# Patient Record
Sex: Female | Born: 1935
Health system: Southern US, Community
[De-identification: ages and names within clinical notes are randomized; demographics above are authoritative.]

## PROBLEM LIST (undated history)

## (undated) DIAGNOSIS — D649 Anemia, unspecified: Secondary | ICD-10-CM

## (undated) DIAGNOSIS — R935 Abnormal findings on diagnostic imaging of other abdominal regions, including retroperitoneum: Secondary | ICD-10-CM

## (undated) DIAGNOSIS — N189 Chronic kidney disease, unspecified: Secondary | ICD-10-CM

## (undated) DIAGNOSIS — E0590 Thyrotoxicosis, unspecified without thyrotoxic crisis or storm: Secondary | ICD-10-CM

## (undated) DIAGNOSIS — H409 Unspecified glaucoma: Secondary | ICD-10-CM

## (undated) DIAGNOSIS — N289 Disorder of kidney and ureter, unspecified: Secondary | ICD-10-CM

## (undated) DIAGNOSIS — K5790 Diverticulosis of intestine, part unspecified, without perforation or abscess without bleeding: Secondary | ICD-10-CM

## (undated) DIAGNOSIS — I10 Essential (primary) hypertension: Secondary | ICD-10-CM

## (undated) HISTORY — PX: COLONOSCOPY: SHX174

## (undated) HISTORY — DX: Unspecified glaucoma: H40.9

## (undated) HISTORY — DX: Abnormal findings on diagnostic imaging of other abdominal regions, including retroperitoneum: R93.5

## (undated) HISTORY — DX: Essential (primary) hypertension: I10

## (undated) HISTORY — DX: Thyrotoxicosis, unspecified without thyrotoxic crisis or storm: E05.90

## (undated) HISTORY — DX: Chronic kidney disease, unspecified: N18.9

## (undated) HISTORY — DX: Anemia, unspecified: D64.9

## (undated) HISTORY — DX: Diverticulosis of intestine, part unspecified, without perforation or abscess without bleeding: K57.90

## (undated) HISTORY — DX: Disorder of kidney and ureter, unspecified: N28.9

---

## 2001-07-19 ENCOUNTER — Emergency Department (HOSPITAL_COMMUNITY): Admission: EM | Admit: 2001-07-19 | Discharge: 2001-07-19 | Payer: Self-pay | Admitting: Emergency Medicine

## 2003-04-02 ENCOUNTER — Emergency Department (HOSPITAL_COMMUNITY): Admission: EM | Admit: 2003-04-02 | Discharge: 2003-04-02 | Payer: Self-pay | Admitting: Emergency Medicine

## 2003-04-02 ENCOUNTER — Encounter: Payer: Self-pay | Admitting: Emergency Medicine

## 2003-12-29 ENCOUNTER — Encounter: Admission: RE | Admit: 2003-12-29 | Discharge: 2003-12-29 | Payer: Self-pay | Admitting: Family Medicine

## 2004-11-24 HISTORY — PX: CATARACT EXTRACTION W/ INTRAOCULAR LENS  IMPLANT, BILATERAL: SHX1307

## 2005-12-25 ENCOUNTER — Emergency Department (HOSPITAL_COMMUNITY): Admission: EM | Admit: 2005-12-25 | Discharge: 2005-12-25 | Payer: Self-pay | Admitting: Emergency Medicine

## 2005-12-29 ENCOUNTER — Encounter: Admission: RE | Admit: 2005-12-29 | Discharge: 2005-12-29 | Payer: Self-pay | Admitting: Family Medicine

## 2006-07-28 ENCOUNTER — Encounter: Payer: Self-pay | Admitting: Internal Medicine

## 2007-05-31 ENCOUNTER — Ambulatory Visit: Payer: Self-pay | Admitting: Internal Medicine

## 2007-05-31 DIAGNOSIS — I10 Essential (primary) hypertension: Secondary | ICD-10-CM | POA: Insufficient documentation

## 2007-05-31 LAB — CONVERTED CEMR LAB
Hemoglobin: 9 g/dL
Ketones, urine, test strip: NEGATIVE
Nitrite: NEGATIVE
Urobilinogen, UA: NEGATIVE

## 2007-06-02 ENCOUNTER — Encounter: Payer: Self-pay | Admitting: Internal Medicine

## 2007-06-04 ENCOUNTER — Encounter: Payer: Self-pay | Admitting: Internal Medicine

## 2007-06-04 LAB — CONVERTED CEMR LAB
Albumin: 3.2 g/dL — ABNORMAL LOW (ref 3.5–5.2)
Basophils Absolute: 0 10*3/uL (ref 0.0–0.1)
Chloride: 113 meq/L — ABNORMAL HIGH (ref 96–112)
Eosinophils Absolute: 0 10*3/uL (ref 0.0–0.6)
Folate: 8.8 ng/mL
GFR calc Af Amer: 36 mL/min
GFR calc non Af Amer: 30 mL/min
HCT: 29.2 % — ABNORMAL LOW (ref 36.0–46.0)
Iron: 40 ug/dL — ABNORMAL LOW (ref 42–145)
MCHC: 34 g/dL (ref 30.0–36.0)
MCV: 83.2 fL (ref 78.0–100.0)
Monocytes Absolute: 0.3 10*3/uL (ref 0.2–0.7)
Neutrophils Relative %: 76.8 % (ref 43.0–77.0)
Potassium: 5 meq/L (ref 3.5–5.1)
RBC: 3.51 M/uL — ABNORMAL LOW (ref 3.87–5.11)
Sodium: 140 meq/L (ref 135–145)
TSH: 0.01 microintl units/mL — ABNORMAL LOW (ref 0.35–5.50)
Transferrin: 188.5 mg/dL — ABNORMAL LOW (ref >=212.0)
Vitamin B-12: 414 pg/mL (ref 211–911)

## 2007-06-05 ENCOUNTER — Encounter: Payer: Self-pay | Admitting: Internal Medicine

## 2007-06-07 ENCOUNTER — Ambulatory Visit: Payer: Self-pay | Admitting: Internal Medicine

## 2007-06-21 ENCOUNTER — Encounter: Payer: Self-pay | Admitting: Internal Medicine

## 2007-06-21 ENCOUNTER — Ambulatory Visit: Payer: Self-pay | Admitting: Internal Medicine

## 2007-06-23 ENCOUNTER — Ambulatory Visit: Payer: Self-pay | Admitting: Internal Medicine

## 2007-06-23 LAB — CONVERTED CEMR LAB
BUN: 21 mg/dL (ref 6–23)
Creatinine, Ser: 1.3 mg/dL — ABNORMAL HIGH (ref 0.4–1.2)
Eosinophils Absolute: 0.1 10*3/uL (ref 0.0–0.6)
Lymphocytes Relative: 28.3 % (ref 12.0–46.0)
MCV: 84.2 fL (ref 78.0–100.0)
Monocytes Absolute: 0.6 10*3/uL (ref 0.2–0.7)
Monocytes Relative: 7.9 % (ref 3.0–11.0)
Neutro Abs: 4.4 10*3/uL (ref 1.4–7.7)
Platelets: 393 10*3/uL (ref 150–400)

## 2007-06-24 ENCOUNTER — Ambulatory Visit: Payer: Self-pay | Admitting: Cardiology

## 2007-07-02 ENCOUNTER — Telehealth (INDEPENDENT_AMBULATORY_CARE_PROVIDER_SITE_OTHER): Payer: Self-pay | Admitting: *Deleted

## 2007-07-15 ENCOUNTER — Ambulatory Visit: Payer: Self-pay | Admitting: Internal Medicine

## 2007-07-15 LAB — CONVERTED CEMR LAB
Basophils Absolute: 0 10*3/uL (ref 0.0–0.1)
Basophils Relative: 0.5 % (ref 0.0–1.0)
Lymphocytes Relative: 32.2 % (ref 12.0–46.0)
MCHC: 34.9 g/dL (ref 30.0–36.0)
Monocytes Relative: 8 % (ref 3.0–11.0)
Neutro Abs: 4.6 10*3/uL (ref 1.4–7.7)
Platelets: 345 10*3/uL (ref 150–400)
RDW: 15.6 % — ABNORMAL HIGH (ref 11.5–14.6)
Sed Rate: 77 mm/hr — ABNORMAL HIGH (ref 0–25)

## 2007-08-03 ENCOUNTER — Ambulatory Visit: Payer: Self-pay | Admitting: Internal Medicine

## 2007-08-03 DIAGNOSIS — R935 Abnormal findings on diagnostic imaging of other abdominal regions, including retroperitoneum: Secondary | ICD-10-CM | POA: Insufficient documentation

## 2007-08-03 DIAGNOSIS — R700 Elevated erythrocyte sedimentation rate: Secondary | ICD-10-CM

## 2007-08-03 DIAGNOSIS — D649 Anemia, unspecified: Secondary | ICD-10-CM | POA: Insufficient documentation

## 2007-08-09 LAB — CONVERTED CEMR LAB
Basophils Relative: 0.2 % (ref 0.0–1.0)
Eosinophils Relative: 2 % (ref 0.0–5.0)
Iron: 46 ug/dL (ref 42–145)
Lymphocytes Relative: 29.5 % (ref 12.0–46.0)
Neutro Abs: 4.5 10*3/uL (ref 1.4–7.7)
Platelets: 385 10*3/uL (ref 150–400)
RDW: 14.4 % (ref 11.5–14.6)
TSH: 0.02 microintl units/mL — ABNORMAL LOW (ref 0.35–5.50)
Transferrin: 196.5 mg/dL — ABNORMAL LOW (ref >=212.0)
WBC: 7.5 10*3/uL (ref 4.5–10.5)

## 2007-09-15 ENCOUNTER — Ambulatory Visit: Payer: Self-pay | Admitting: Internal Medicine

## 2007-09-22 ENCOUNTER — Encounter: Payer: Self-pay | Admitting: Gynecologic Oncology

## 2007-09-22 ENCOUNTER — Ambulatory Visit: Admission: RE | Admit: 2007-09-22 | Discharge: 2007-09-22 | Payer: Self-pay | Admitting: Gynecologic Oncology

## 2007-10-22 ENCOUNTER — Encounter (INDEPENDENT_AMBULATORY_CARE_PROVIDER_SITE_OTHER): Payer: Self-pay | Admitting: *Deleted

## 2007-11-22 ENCOUNTER — Ambulatory Visit: Payer: Self-pay | Admitting: Internal Medicine

## 2007-11-29 LAB — CONVERTED CEMR LAB
Basophils Relative: 0.7 % (ref 0.0–1.0)
CO2: 26 meq/L (ref 19–32)
Chloride: 110 meq/L (ref 96–112)
Creatinine, Ser: 1.1 mg/dL (ref 0.4–1.2)
Eosinophils Relative: 2.3 % (ref 0.0–5.0)
HCT: 29.8 % — ABNORMAL LOW (ref 36.0–46.0)
Hemoglobin: 10.1 g/dL — ABNORMAL LOW (ref 12.0–15.0)
Iron: 56 ug/dL (ref 42–145)
Monocytes Absolute: 0.3 10*3/uL (ref 0.2–0.7)
Neutrophils Relative %: 60 % (ref 43.0–77.0)
Potassium: 4.5 meq/L (ref 3.5–5.1)
RBC: 3.5 M/uL — ABNORMAL LOW (ref 3.87–5.11)
RDW: 14 % (ref 11.5–14.6)
Sodium: 139 meq/L (ref 135–145)
WBC: 7.6 10*3/uL (ref 4.5–10.5)

## 2007-11-30 ENCOUNTER — Encounter: Admission: RE | Admit: 2007-11-30 | Discharge: 2007-11-30 | Payer: Self-pay | Admitting: Internal Medicine

## 2007-12-20 ENCOUNTER — Telehealth (INDEPENDENT_AMBULATORY_CARE_PROVIDER_SITE_OTHER): Payer: Self-pay | Admitting: *Deleted

## 2008-01-27 DIAGNOSIS — K5730 Diverticulosis of large intestine without perforation or abscess without bleeding: Secondary | ICD-10-CM | POA: Insufficient documentation

## 2008-02-16 ENCOUNTER — Ambulatory Visit: Payer: Self-pay | Admitting: Internal Medicine

## 2008-02-22 ENCOUNTER — Telehealth (INDEPENDENT_AMBULATORY_CARE_PROVIDER_SITE_OTHER): Payer: Self-pay | Admitting: *Deleted

## 2008-02-22 LAB — CONVERTED CEMR LAB
T3, Free: 5.7 pg/mL — ABNORMAL HIGH (ref 2.3–4.2)
TSH: 0.02 microintl units/mL — ABNORMAL LOW (ref 0.35–5.50)

## 2008-02-23 ENCOUNTER — Encounter (INDEPENDENT_AMBULATORY_CARE_PROVIDER_SITE_OTHER): Payer: Self-pay | Admitting: *Deleted

## 2008-03-28 ENCOUNTER — Telehealth (INDEPENDENT_AMBULATORY_CARE_PROVIDER_SITE_OTHER): Payer: Self-pay | Admitting: *Deleted

## 2008-04-06 ENCOUNTER — Encounter (INDEPENDENT_AMBULATORY_CARE_PROVIDER_SITE_OTHER): Payer: Self-pay | Admitting: *Deleted

## 2008-05-01 ENCOUNTER — Ambulatory Visit: Payer: Self-pay | Admitting: Internal Medicine

## 2008-05-04 ENCOUNTER — Telehealth (INDEPENDENT_AMBULATORY_CARE_PROVIDER_SITE_OTHER): Payer: Self-pay | Admitting: *Deleted

## 2008-05-04 LAB — CONVERTED CEMR LAB: TSH: 0.03 microintl units/mL — ABNORMAL LOW (ref 0.35–5.50)

## 2008-05-08 ENCOUNTER — Encounter (INDEPENDENT_AMBULATORY_CARE_PROVIDER_SITE_OTHER): Payer: Self-pay | Admitting: *Deleted

## 2008-05-23 ENCOUNTER — Telehealth (INDEPENDENT_AMBULATORY_CARE_PROVIDER_SITE_OTHER): Payer: Self-pay | Admitting: *Deleted

## 2008-06-13 ENCOUNTER — Ambulatory Visit: Payer: Self-pay | Admitting: Internal Medicine

## 2008-06-13 DIAGNOSIS — E039 Hypothyroidism, unspecified: Secondary | ICD-10-CM | POA: Insufficient documentation

## 2008-06-28 ENCOUNTER — Encounter: Payer: Self-pay | Admitting: Internal Medicine

## 2009-01-29 ENCOUNTER — Encounter: Payer: Self-pay | Admitting: Internal Medicine

## 2010-02-21 ENCOUNTER — Ambulatory Visit: Payer: Self-pay | Admitting: Internal Medicine

## 2010-02-21 LAB — CONVERTED CEMR LAB
BUN: 21 mg/dL (ref 6–23)
Basophils Absolute: 0.1 10*3/uL (ref 0.0–0.1)
Basophils Relative: 1.1 % (ref 0.0–3.0)
Chloride: 109 meq/L (ref 96–112)
Cholesterol: 108 mg/dL (ref 0–200)
Creatinine, Ser: 1.7 mg/dL — ABNORMAL HIGH (ref 0.4–1.2)
Eosinophils Absolute: 0.1 10*3/uL (ref 0.0–0.7)
GFR calc non Af Amer: 37.9 mL/min (ref >60)
Hemoglobin: 11.6 g/dL — ABNORMAL LOW (ref 12.0–15.0)
LDL Cholesterol: 49 mg/dL (ref 0–99)
MCHC: 33.3 g/dL (ref 30.0–36.0)
MCV: 90 fL (ref 78.0–100.0)
Monocytes Absolute: 0.5 10*3/uL (ref 0.1–1.0)
Neutro Abs: 4 10*3/uL (ref 1.4–7.7)
Potassium: 4.4 meq/L (ref 3.5–5.1)
RBC: 3.89 M/uL (ref 3.87–5.11)
RDW: 15.9 % — ABNORMAL HIGH (ref 11.5–14.6)
Triglycerides: 76 mg/dL (ref 0.0–149.0)
VLDL: 15.2 mg/dL (ref 0.0–40.0)

## 2010-02-27 ENCOUNTER — Telehealth (INDEPENDENT_AMBULATORY_CARE_PROVIDER_SITE_OTHER): Payer: Self-pay | Admitting: *Deleted

## 2010-02-27 DIAGNOSIS — N182 Chronic kidney disease, stage 2 (mild): Secondary | ICD-10-CM | POA: Insufficient documentation

## 2010-03-26 ENCOUNTER — Ambulatory Visit: Payer: Self-pay | Admitting: Internal Medicine

## 2010-04-09 ENCOUNTER — Ambulatory Visit: Payer: Self-pay | Admitting: Internal Medicine

## 2010-04-11 ENCOUNTER — Telehealth: Payer: Self-pay | Admitting: Internal Medicine

## 2010-04-11 LAB — CONVERTED CEMR LAB
Chloride: 104 meq/L (ref 96–112)
GFR calc non Af Amer: 40.63 mL/min (ref >60)

## 2010-04-18 ENCOUNTER — Ambulatory Visit (HOSPITAL_COMMUNITY): Admission: RE | Admit: 2010-04-18 | Discharge: 2010-04-18 | Payer: Self-pay | Admitting: Internal Medicine

## 2010-05-08 ENCOUNTER — Encounter: Payer: Self-pay | Admitting: Internal Medicine

## 2010-09-09 ENCOUNTER — Encounter: Payer: Self-pay | Admitting: Internal Medicine

## 2010-09-23 ENCOUNTER — Ambulatory Visit: Payer: Self-pay | Admitting: Internal Medicine

## 2010-09-23 DIAGNOSIS — N183 Chronic kidney disease, stage 3 (moderate): Secondary | ICD-10-CM

## 2010-09-25 LAB — CONVERTED CEMR LAB
ALT: 13 units/L (ref 0–35)
AST: 23 units/L (ref 0–37)
CO2: 24 meq/L (ref 19–32)
Chloride: 109 meq/L (ref 96–112)
Sodium: 139 meq/L (ref 135–145)
Uric Acid, Serum: 9.1 mg/dL — ABNORMAL HIGH (ref 2.4–7.0)

## 2010-12-15 ENCOUNTER — Encounter: Payer: Self-pay | Admitting: Internal Medicine

## 2010-12-26 NOTE — Assessment & Plan Note (Signed)
Summary: yearly checkup/kdc   Vital Signs:  Patient profile:   76 year old female Height:      64.25 inches Weight:      162.2 pounds BMI:     27.73 Pulse rate:   74 / minute BP sitting:   120 / 80  Vitals Entered By: Shary Decamp (Mar 26, 2010 2:55 PM) CC: yearly, has gyn, last Td??  pt declined Td today   History of Present Illness: yearly checkup, chart reviewed  Hypertension--  ambulatory BPs fluctuates up to 153 sometimes   elevated creatinine , was refered to nephrology, appointment pending   Hypothyroidism-- see endocrinology    h/o  INSOMNIA-- sleeps well, on no meds        Preventive Screening-Counseling & Management  Caffeine-Diet-Exercise     Caffeine use/day: 3     Does Patient Exercise: no  Current Medications (verified): 1)  Diovan 160 Mg  Tabs (Valsartan) .Marland Kitchen.. 1 By Mouth Qd 2)  Amlodipine Besylate 10 Mg  Tabs (Amlodipine Besylate) .Marland Kitchen.. 1 By Mouth Once Daily 3)  Levothroid 75 Mcg Tabs (Levothyroxine Sodium) .Marland Kitchen.. 1 By Mouth Once Daily  Allergies (verified): No Known Drug Allergies  Past History:  Past Medical History: G1 P1 Hypertension Heperthyroidism, s/p ablation, sees Dr Talmage Nap  DERMATITIS  DIVERTICULOSIS, COLON  h/o elevated sed rate ABNORMAL  CT and MRI of pancreas, last MRI 1-09: after d/w GI we rec.  to f/u clinically and redo XRs if problems h/o ANEMIA  CRI? elevated creatinine 4-11 , refered to nephrology  Past Surgical History: h/o thick endometrion, s/p Bx 2008   Family History: Father: deceased TB MI-- sister (age 69?)  and a brother (age 29 aprox.) DM--M Colon Ca -- sister breast ca-- no    Social History: Divorced lives with her sister  Has one child who lives here in town tobacco-- never ETOH-- never  diet-- not paying attention  exercise-- noDoes Patient Exercise:  no Caffeine use/day:  3  Review of Systems CV:  Denies chest pain or discomfort, palpitations, and swelling of feet. Resp:  Denies cough and  shortness of breath. GI:  Denies bloody stools, nausea, and vomiting. GU:  Denies dysuria, hematuria, and urinary hesitancy. Psych:  Denies anxiety and depression.  Physical Exam  General:  alert and well-developed.   Lungs:  normal respiratory effort, no intercostal retractions, no accessory muscle use, and normal breath sounds.   Heart:  normal rate, regular rhythm, and no murmur.   Abdomen:  soft, non-tender, no distention, no masses, no guarding, and no rigidity.   Extremities:  no edema Psych:  Oriented X3, memory intact for recent and remote, normally interactive, good eye contact, not anxious appearing, and not depressed appearing.     Impression & Recommendations:  Problem # 1:  CHRONIC KIDNEY DISEASE STAGE II (MILD) (ICD-585.2)  referral to nephrology  pending on reviewing the labs, her creatinine has been increased before  plan: will be sure she is seen by nephrology ultrasound decrease Diovan to 80 mg, instructions  Orders: Radiology Referral (Radiology)  Problem # 2:  ROUTINE GENERAL MEDICAL EXAM@HEALTH  CARE FACL (ICD-V70.0)  pt somehow reluctant to anticipatory care, explained concept of screening  Td--today pneumonia shot-- declined, explained benefits  cscope  2008, no polyps, repeat in 5 years due to family history   has seen Dr Henderson Cloud before, not seen  in a while  rec to see gyn  mammogram  family history coronary artery disease, recommend to take aspirin 81  mg daily  Orders: Radiology Referral (Radiology)  Problem # 3:  HYPERTHYROIDISM (ICD-242.90)  follow-up by endocrinology  Labs Reviewed: TSH: 0.03 (05/01/2008)     Problem # 4:  HYPERTENSION (ICD-401.9)  because her creatinine is elevated, we are cutting Diovan in  half, see instructions---no charge for nurse visit EKG normal sinus rhythm, no old EKGs  Her updated medication list for this problem includes:    Diovan 160 Mg Tabs (Valsartan) ..... Half tablet daily    Amlodipine  Besylate 10 Mg Tabs (Amlodipine besylate) .Marland Kitchen... 1 by mouth once daily  BP today: 120/80 Prior BP: 144/80 (02/21/2010)  Labs Reviewed: K+: 4.4 (02/21/2010) Creat: : 1.7 (02/21/2010)   Chol: 108 (02/21/2010)   HDL: 44.00 (02/21/2010)   LDL: 49 (02/21/2010)   TG: 76.0 (02/21/2010)  Orders: EKG w/ Interpretation (93000)  Problem # 5:  ANEMIA NOS (ICD-285.9) she has mild anemia, labs  Hgb: 11.6 (02/21/2010)   Hct: 35.0 (02/21/2010)   Platelets: 364.0 (02/21/2010) RBC: 3.89 (02/21/2010)   RDW: 15.9 (02/21/2010)   WBC: 6.4 (02/21/2010) MCV: 90.0 (02/21/2010)   MCHC: 33.3 (02/21/2010) Ferritin: 157.8 (06/07/2007) Iron: 56 (11/22/2007)   B12: 414 (05/31/2007)   Folate: 8.8 (05/31/2007)   TSH: 0.03 (05/01/2008)  Complete Medication List: 1)  Diovan 160 Mg Tabs (Valsartan) .... Half tablet daily 2)  Amlodipine Besylate 10 Mg Tabs (Amlodipine besylate) .Marland Kitchen.. 1 by mouth once daily 3)  Levothroid 75 Mcg Tabs (Levothyroxine sodium) .Marland Kitchen.. 1 by mouth once daily 4)  Aspirin 81 Mg Tbec (Aspirin) .... One tablet by mouth daily  Other Orders: Tdap => 28yrs IM (16109) Admin 1st Vaccine (60454) Admin 1st Vaccine [facility]) [phone])  Patient Instructions: 1)  take only half diovan 2)  start  aspirin 81 mg daily 3)  nurse visit in two weeks for a BP check  4)  BMP  (dx HTN) , iron-ferritin-B12-folic acid (Dx anemia) 5)  Please schedule a follow-up appointment in 6 months .    Preventive Care Screening  Prior Values:    Last Flu Shot:  declined (11/22/2007)    Risk Factors:  Tobacco use:  never Passive smoke exposure:  yes Drug use:  no Caffeine use:  3 drinks per day Alcohol use:  no Exercise:  no    Tetanus/Td Vaccine    Vaccine Type: Tdap    Site: right deltoid    Dose: 0.5 ml    Route: IM    Given by: Shary Decamp    Exp. Date: 02/16/2012    Lot #: [id number]

## 2010-12-26 NOTE — Progress Notes (Signed)
Summary: [facility] 4/7, 4/8  Phone Note Other Incoming   Summary of Call: labs reviewed her creatinine has increased, she has a single kidney ( left kidney absent per CTs and MRIs) advise patient: Cholesterol very good hemoglobin better than before her kidney function has decreased, will arrange a nephrology referral.  Send copy of all BMPs available, send a copy of the MRI that showed absent left kidney. Jose E. Paz MD  February 27, 2010 5:07 PM    Follow-up for Phone Call        Left message on machine for pt to return call Shary Decamp  February 28, 2010 9:27 AM left message with family member to have pt return call Shary Decamp  March 01, 2010 2:20 PM discussed with pt Shary Decamp  March 01, 2010 2:36 PM   New Problems: CHRONIC KIDNEY DISEASE STAGE II (MILD) (ICD-585.2)   New Problems: CHRONIC KIDNEY DISEASE STAGE II (MILD) (ICD-585.2)

## 2010-12-26 NOTE — Assessment & Plan Note (Signed)
Summary: BP CHECK--PH   Nurse Visit   Vital Signs:  Patient profile:   76 year old female Height:      64.25 inches Weight:      162 pounds BMI:     27.69 Pulse rate:   74 / minute BP sitting:   118 / 60  Vitals Entered By: Shary Decamp (Apr 09, 2010 11:08 AM)  Impression & Recommendations:  Problem # 1:  HYPERTENSION (ICD-401.9) BP still well controlled with half-diovan  Her updated medication list for this problem includes:    Diovan 160 Mg Tabs (Valsartan) ..... Half tablet daily    Amlodipine Besylate 10 Mg Tabs (Amlodipine besylate) .Marland Kitchen... 1 by mouth once daily  Orders: Venipuncture (91478) TLB-BMP (Basic Metabolic Panel-BMET) (80048-METABOL) Est. Patient Level I (29562)  BP today: 118/60 Prior BP: 120/80 (03/26/2010)  Labs Reviewed: K+: 4.4 (02/21/2010) Creat: : 1.7 (02/21/2010)   Chol: 108 (02/21/2010)   HDL: 44.00 (02/21/2010)   LDL: 49 (02/21/2010)   TG: 76.0 (02/21/2010)  Complete Medication List: 1)  Diovan 160 Mg Tabs (Valsartan) .... Half tablet daily 2)  Amlodipine Besylate 10 Mg Tabs (Amlodipine besylate) .Marland Kitchen.. 1 by mouth once daily 3)  Levothroid 75 Mcg Tabs (Levothyroxine sodium) .Marland Kitchen.. 1 by mouth once daily 4)  Aspirin 81 Mg Tbec (Aspirin) .... One tablet by mouth daily  Other Orders: TLB-Iron, (Fe) Total (83540-FE) TLB-Ferritin (82728-FER) T-Vitamin B12 (13086-57846) TLB-Folic Acid (Folate) (82746-FOL)  CC: bp check   Allergies: No Known Drug Allergies  Orders Added: 1)  Venipuncture [36415] 2)  TLB-Iron, (Fe) Total [83540-FE] 3)  TLB-Ferritin [82728-FER] 4)  T-Vitamin B12 [82607-23330] 5)  TLB-Folic Acid (Folate) [82746-FOL] 6)  TLB-BMP (Basic Metabolic Panel-BMET) [80048-METABOL] 7)  Est. Patient Level I [96295]

## 2010-12-26 NOTE — Assessment & Plan Note (Signed)
Summary: LT FOOT SWELLING FOR 2 WEEKS///SPH   Vital Signs:  Patient profile:   76 year old female Weight:      165.13 pounds Pulse rate:   88 / minute Pulse rhythm:   regular BP sitting:   140 / 84  (left arm) Cuff size:   regular  Vitals Entered By: Army Fossa CMA (September 23, 2010 3:26 PM) CC: Pt here for (L) foot sweeling  Comments x 2 weeks minor pain [facility] [address] declines flu shot    History of Present Illness: 2 weeks ago, the base of the left great toe swell up  and start to hurt a lot. at the present time, is less swollen and tender. She has a history of gout but has not had an attack in [place]  time. is not very familiar with a  diet for gout  ROS Denies any fevers or injury her lower extremities Good medication compliance with her medicines   Current Medications (verified): 1)  Diovan 160 Mg  Tabs (Valsartan) .... Half Tablet Daily 2)  Amlodipine Besylate 10 Mg  Tabs (Amlodipine Besylate) .Marland Kitchen.. 1 By Mouth Once Daily 3)  Levothroid 75 Mcg Tabs (Levothyroxine Sodium) .Marland Kitchen.. 1 By Mouth Once Daily 4)  Aspirin 81 Mg Tbec (Aspirin) .... One Tablet By Mouth Daily  Allergies (verified): No Known Drug Allergies  Past History:  Past Medical History: G1 P1 Hypertension Heperthyroidism, s/p ablation, sees Dr Talmage Nap  DERMATITIS  DIVERTICULOSIS, COLON  h/o elevated sed rate ABNORMAL  CT and MRI of pancreas, last MRI 1-09: after d/w GI we rec.  to f/u clinically and redo XRs if problems h/o ANEMIA  elevated creatinine 4-11: stage 3 CKD, saw  nephrology, history of solitary kidney h/o Gout   Past Surgical History: h/o thick endometrium , s/p Bx 2008   Social History: Reviewed history from 03/26/2010 and no changes required. Divorced lives with her sister  Has one child who lives here in town tobacco-- never ETOH-- never  diet-- not paying attention  exercise-- no  Physical Exam  General:  alert, well-developed, and well-nourished.   Pulses:  good  pedal pulses bilaterally Extremities:  no pretibial edema right foot normal Left foot with residual warmness and  swelling at the base of the first toe. No redness   Impression & Recommendations:  Problem # 1:  GOUT, UNSPECIFIED (ICD-274.9) patient presents with history consistent with gout. She recalls having gout in the past, no episodes in [place] time. plan: We'll treat with steroids given her history of kidney disease and cost of colchicine Discussed a gout  diet patient knows to call me if she's not completely well within a few days or if the symptoms came back Orders: TLB-ALT (SGPT) (84460-ALT) TLB-AST (SGOT) (84450-SGOT) TLB-Uric Acid, Blood (84550-URIC)  Problem # 2:  CHRONIC KIDNEY DISEASE STAGE III (MODERATE) (ICD-585.3) see note from nephrology, no changes were made to her treatment  Complete Medication List: 1)  Diovan 160 Mg Tabs (Valsartan) .... Half tablet daily 2)  Amlodipine Besylate 10 Mg Tabs (Amlodipine besylate) .Marland Kitchen.. 1 by mouth once daily 3)  Levothroid 75 Mcg Tabs (Levothyroxine sodium) .Marland Kitchen.. 1 by mouth once daily 4)  Aspirin 81 Mg Tbec (Aspirin) .... One tablet by mouth daily 5)  Prednisone 10 Mg Tabs (Prednisone) .... 4 by mouth once daily x 2 days, 3x2, 2x2, 1x2  Other Orders: Venipuncture (52841) TLB-BMP (Basic Metabolic Panel-BMET) (80048-METABOL)  Patient Instructions: 1)  Please schedule a follow-up appointment in 4 months , routine  office visit Prescriptions: PREDNISONE 10 MG TABS (PREDNISONE) 4 by mouth once daily x 2 days, 3x2, 2x2, 1x2  #20 x 0   Entered and Authorized by:   Elita Quick E. Paz MD   Signed by:   Nolon Rod. Paz MD on 09/23/2010   Method used:   Print then Give to Patient   RxID:   [id number]    Orders Added: 1)  Venipuncture [56213] 2)  TLB-BMP (Basic Metabolic Panel-BMET) [80048-METABOL] 3)  TLB-ALT (SGPT) [84460-ALT] 4)  TLB-AST (SGOT) [84450-SGOT] 5)  TLB-Uric Acid, Blood [84550-URIC] 6)  Est. Patient Level III [08657]

## 2010-12-26 NOTE — Assessment & Plan Note (Signed)
Summary: med refill bp, pt has cpx 03/26/10/alr   Vital Signs:  Patient profile:   76 year old female Height:      64.25 inches Weight:      165 pounds BMI:     28.20 Pulse rate:   86 / minute BP sitting:   144 / 80  Vitals Entered By: Shary Decamp (February 21, 2010 11:16 AM) CC: refill meds   History of Present Illness: last seen 7-09 here for RF meds to have a yearly check up here 5-11 Hypertension-- good medication compliance , ambulatory BPs 140-130/80  Hypothyroidism-- sees Dr Talmage Nap , she Rx thyroid meds     Current Medications (verified): 1)  Diovan 160 Mg  Tabs (Valsartan) .Marland Kitchen.. 1 By Mouth Qd 2)  Amlodipine Besylate 10 Mg  Tabs (Amlodipine Besylate) .Marland Kitchen.. 1 By Mouth Once Daily 3)  Levothyroxine Sodium 88 Mcg Tabs (Levothyroxine Sodium) .Marland Kitchen.. 1 By Mouth Once Daily  Allergies (verified): No Known Drug Allergies  Past History:  Past Medical History: G1 P1 Hypertension Hypothyroidism? low TSH off meds: Dx  Heperthyroidism DERMATITIS  DIVERTICULOSIS, COLON  SEDIMENTATION RATE, ELEVATED  ABNORMAL  CT and MRI of pancreas, last MRI 1-09: after d/w GI we rec.  to f/u clinically and redo XRs if problems h/o ANEMIA NOS INSOMNIA  WEIGHT LOSS, ABNORMAL  Past Surgical History: no major surgeries   Family History: Father: deceased TB MI-- sister and a brother  DM--M Colon Ca - sister breast ca-- no    Social History: Divorced lives with her sister  Has one child who lives here in town tobacco-- never ETOH-- never   Review of Systems CV:  Denies chest pain or discomfort and swelling of feet. Resp:  Denies cough and shortness of breath.  Physical Exam  General:  alert, well-developed, and well-nourished.   Lungs:  normal respiratory effort, no intercostal retractions, no accessory muscle use, and normal breath sounds.   Heart:  normal rate, regular rhythm, and no murmur.   Psych:  Oriented X3, memory intact for recent and remote, normally interactive, good eye  contact, not anxious appearing, and not depressed appearing.     Impression & Recommendations:  Problem # 1:  ANEMIA NOS (ICD-285.9) history of anemia, recheck a CBC today Orders: TLB-CBC Platelet - w/Differential (85025-CBCD)  Problem # 2:  HYPERTENSION (ICD-401.9) meds refill, labs including a FLP   The following medications were removed from the medication list:    Metoprolol Tartrate 25 Mg Tabs (Metoprolol tartrate) .Marland Kitchen... 1 by mouth bid Her updated medication list for this problem includes:    Diovan 160 Mg Tabs (Valsartan) .Marland Kitchen... 1 by mouth qd    Amlodipine Besylate 10 Mg Tabs (Amlodipine besylate) .Marland Kitchen... 1 by mouth once daily  Orders: Venipuncture (62130) TLB-BMP (Basic Metabolic Panel-BMET) (80048-METABOL) TLB-Lipid Panel (80061-LIPID) Prescription Created Electronically [phone])  BP today: 144/80 Prior BP: 142/80 (06/13/2008)  Labs Reviewed: K+: 4.5 (11/22/2007) Creat: : 1.1 (11/22/2007)     Problem # 3:  ROUTINE GENERAL MEDICAL EXAM@HEALTH  CARE FACL (ICD-V70.0) due for a general checkup, she has an appointment in May 2011 she is not seeing gynecology in a while  Complete Medication List: 1)  Diovan 160 Mg Tabs (Valsartan) .Marland Kitchen.. 1 by mouth qd 2)  Amlodipine Besylate 10 Mg Tabs (Amlodipine besylate) .Marland Kitchen.. 1 by mouth once daily 3)  Levothyroxine Sodium 88 Mcg Tabs (Levothyroxine sodium) .Marland Kitchen.. 1 by mouth once daily  Patient Instructions: 1)  keep your appointment in May for your yearly exam  Prescriptions: AMLODIPINE BESYLATE 10 MG  TABS (AMLODIPINE BESYLATE) 1 by mouth once daily  #90 x 3   Entered and Authorized by:   Elita Quick E. Paz MD   Signed by:   Nolon Rod. Paz MD on 02/21/2010   Method used:   [facility] to        [facility]  [address] [id number]       [address]       [place], [place]  [address]       Ph: [phone] or [phone]       Fax: [phone]   RxID:   [id number] DIOVAN 160 MG  TABS (VALSARTAN) 1 by mouth QD  #90 x  3   Entered and Authorized by:   Nolon Rod. Paz MD   Signed by:   Nolon Rod. Paz MD on 02/21/2010   Method used:   [facility] to        [facility]  [address] [id number]       [address]       [place], [place]  [address]       Ph: [phone] or [phone]       Fax: [phone]   RxID:   [id number]

## 2010-12-26 NOTE — Letter (Signed)
Summary: stable, followup in 6 months. Nephrology  [facility]   Imported By: Lanelle Bal 09/24/2010 10:35:03  _____________________________________________________________________  External Attachment:    Type:   Image     Comment:   External Document

## 2010-12-26 NOTE — Progress Notes (Signed)
Summary: [id number] referral  Phone Note Outgoing Call   Details for Reason: re: nephrology referral Summary of Call: Per Larita Fife, they still have the patient's info but "the schedules are so slammed that we don't have any where to put her.  the MD has reviewed & does not feel like it is priority.  we will call her when our schedules open back up."  Advised that labs show that kidney functions have not improved.  Labs faxed to [facility] @ [phone].  She will have MD review again ...Marland KitchenMarland KitchenShary Decamp  Apr 11, 2010 2:29 PM   Follow-up for Phone Call        is ok ; in the meantime schedule a renal u/s Quinlyn Tep E. Marchel Foote MD  Apr 12, 2010 8:26 AM   discussed with pt.Marland KitchenMarland KitchenShary Decamp  Apr 12, 2010 9:43 AM

## 2010-12-26 NOTE — Consult Note (Signed)
Summary: Dx CKD III---[facility]  [facility]   Imported By: Lanelle Bal 05/29/2010 15:29:50  _____________________________________________________________________  External Attachment:    Type:   Image     Comment:   External Document

## 2011-01-21 ENCOUNTER — Ambulatory Visit: Payer: Self-pay | Admitting: Internal Medicine

## 2011-02-04 ENCOUNTER — Ambulatory Visit (INDEPENDENT_AMBULATORY_CARE_PROVIDER_SITE_OTHER): Payer: Medicare PPO | Admitting: Internal Medicine

## 2011-02-04 ENCOUNTER — Encounter: Payer: Self-pay | Admitting: Internal Medicine

## 2011-02-04 DIAGNOSIS — I10 Essential (primary) hypertension: Secondary | ICD-10-CM

## 2011-02-04 DIAGNOSIS — M109 Gout, unspecified: Secondary | ICD-10-CM

## 2011-02-11 NOTE — Assessment & Plan Note (Signed)
Summary: 4 mointh ov/ph   Vital Signs:  Patient profile:   76 year old female Height:      64.25 inches Weight:      164.13 pounds BMI:     28.06 Pulse rate:   78 / minute Pulse rhythm:   regular BP sitting:   116 / 80  (left arm) Cuff size:   regular  Vitals Entered By: Army Fossa CMA (February 04, 2011 10:45 AM) CC: 4 month f/u- not fasting  Comments no concerns  [facility] [address]    History of Present Illness:   routine office visit  doing well Since the last time, she had no further gout episodes Ambulatory blood pressures varies from 123 and 143 / 70s  has an appointment to see the endocrinologist next month , due to see nephrology  as well   ROS  Denies any cough, shortness of breath or lower extremity edema No fatigue No nausea or vomiting  Current Medications (verified): 1)  Diovan 160 Mg  Tabs (Valsartan) .... Half Tablet Daily 2)  Amlodipine Besylate 10 Mg  Tabs (Amlodipine Besylate) .Marland Kitchen.. 1 By Mouth Once Daily 3)  Levothroid 75 Mcg Tabs (Levothyroxine Sodium) .Marland Kitchen.. 1 By Mouth Once Daily 4)  Aspirin 81 Mg Tbec (Aspirin) .... One Tablet By Mouth Daily  Allergies (verified): No Known Drug Allergies  Past History:  Past Medical History: Reviewed history from 09/23/2010 and no changes required. G1 P1 Hypertension Heperthyroidism, s/p ablation, sees Dr Talmage Nap  DERMATITIS  DIVERTICULOSIS, COLON  h/o elevated sed rate ABNORMAL  CT and MRI of pancreas, last MRI 1-09: after d/w GI we rec.  to f/u clinically and redo XRs if problems h/o ANEMIA  elevated creatinine 4-11: stage 3 CKD, saw  nephrology, history of solitary kidney h/o Gout   Past Surgical History: Reviewed history from 09/23/2010 and no changes required. h/o thick endometrium , s/p Bx 2008   Social History: Reviewed history from 03/26/2010 and no changes required. Divorced lives with her sister  Has one child who lives here in town tobacco-- never ETOH-- never  diet-- not paying attention    exercise-- no  Physical Exam  General:  alert, well-developed, and well-nourished.   Lungs:  normal respiratory effort, no intercostal retractions, no accessory muscle use, and normal breath sounds.   Heart:  normal rate, regular rhythm, and no murmur.   Extremities:  no pretibial edema     Impression & Recommendations:  Problem # 1:  CHRONIC KIDNEY DISEASE STAGE III (MODERATE) (ICD-585.3)  doing well  last creatinine a nephrology October 2011 was 1.87 According to the patient, they increase Diovan from half to one tablet daily  Problem # 2:  GOUT, UNSPECIFIED (ICD-274.9)  no further episodes  Problem # 3:  HYPERTENSION (ICD-401.9)  well controlled today, occasionally at home  systolic BP goes up to the 140s. No change for now According to the patient, nephrology increase Diovan from half to one tablet daily  Her updated medication list for this problem includes:    Diovan 160 Mg Tabs (Valsartan) .Marland Kitchen... 1 tablet once daily    Amlodipine Besylate 10 Mg Tabs (Amlodipine besylate) .Marland Kitchen... 1 by mouth once daily  BP today: 116/80 Prior BP: 140/84 (09/23/2010)  Labs Reviewed: K+: 4.6 (09/23/2010) Creat: : 1.7 (09/23/2010)   Chol: 108 (02/21/2010)   HDL: 44.00 (02/21/2010)   LDL: 49 (02/21/2010)   TG: 76.0 (02/21/2010)  Complete Medication List: 1)  Diovan 160 Mg Tabs (Valsartan) .Marland Kitchen.. 1 tablet once daily  2)  Amlodipine Besylate 10 Mg Tabs (Amlodipine besylate) .Marland Kitchen.. 1 by mouth once daily 3)  Levothroid 75 Mcg Tabs (Levothyroxine sodium) .Marland Kitchen.. 1 by mouth once daily 4)  Aspirin 81 Mg Tbec (Aspirin) .... One tablet by mouth daily  Patient Instructions: 1)  Please schedule a follow-up appointment in 4 to 6 months . Fasting , physical exam    Orders Added: 1)  Est. Patient Level III [16109]

## 2011-02-14 ENCOUNTER — Other Ambulatory Visit: Payer: Self-pay | Admitting: Internal Medicine

## 2011-02-27 ENCOUNTER — Other Ambulatory Visit: Payer: Self-pay | Admitting: Internal Medicine

## 2011-04-08 ENCOUNTER — Encounter: Payer: Self-pay | Admitting: Internal Medicine

## 2011-04-08 NOTE — Assessment & Plan Note (Signed)
[facility]                         GASTROENTEROLOGY OFFICE NOTE   NAME:[name], [name]                       MRN:          [id number]  DATE:06/07/2007                            DOB:          25-Aug-1935    REASON FOR CONSULTATION:  Diarrhea and weight loss.   HISTORY:  This is a 76 year old African American female with a history  of hypertension, and hypothyroidism.  She is referred through the  courtesy of Dr. Drue Novel regarding diarrhea and weight loss.  Patient reports  a 1 month history of post prandial diarrhea, generally occurring 30  minutes from the time of her meal.  Her daughter feels symptoms may have  been going on a little longer than 1 month.  Stools are described as  light brown and tend to sink.  There has been no associated nausea,  vomiting, or abdominal pain.  She denies any recent antibiotic exposure  or change in diet.  No exposure to persons with similar symptoms.  No  exotic travel.  She states that her appetite is great.  Despite this she  has lost 35 pounds over the past 3 months.  She feels weak and fatigued.  There has been no bleeding.  Preliminary evaluation from Dr. Leta Jungling  office revealed stool studies that were negative for enteric pathogens.  As well, stool was negative for lactoferrin.  Blood work was remarkable  for anemia with a hemoglobin of 9.9 and an MCV of 83.2.  B-12 and folate  were normal.  Iron levels were low.  Basic chemistries revealed an  elevated BUN and creatinine at 33 and 1.8 respectively.  Albumin was low  at 3.2.  Urine protein negative.  Alkaline phosphatase mildly elevated  at 121.  Other liver studies were normal.  Of importance her TSH was  markedly low at 0.01.  Based on that result she tells me that her  thyroid medication was halved approximately one week ago.  She has not  had close supervision of her medical care by her own admission.  Her  recent visit with Dr. Drue Novel one week ago was her initial  evaluation at  that facility.   PAST MEDICAL HISTORY:  1. Hypertension.  2. Hypothyroidism.   PAST SURGICAL HISTORY:  None.   ALLERGIES:  NO KNOWN DRUG ALLERGIES.   CURRENT MEDICATIONS:  1. Metoprolol 25 mg p.o. b.i.d.  2. Diovan 160 mg daily.  3. Amlodipine 10 mg daily.  4. Levothyroxine 88 mcg daily.   FAMILY HISTORY:  Sister with colon cancer diagnosed at age 75.  Mother  with diabetes.   SOCIAL HISTORY:  Patient is separate with one daughter, she lives with  her sister.  She attended college.  She currently works as a Conservation officer, nature for  [employer].  She does not smoke or use alcohol.   REVIEW OF SYSTEMS:  Per diagnostic evaluation form.   PHYSICAL EXAMINATION:  Well-appearing female in no acute distress.  Blood pressure is 134/54, heart rate is 78, weight is 119.8 pounds.  She  is 5 feet 4-1/2 inches in height.  HEENT:  Sclerae are anicteric, conjunctivae  are pink, oral mucosa  intact.  There is no adenopathy.  LUNGS:  Clear.  HEART:  Regular.  ABDOMEN:  Soft without tenderness, mass, or hernia.  EXTREMITIES:  Without edema.   IMPRESSION:  1. Iatrogenic hyperthyroidism.  2. Diarrhea and weight loss.  Both the diarrhea and weight loss may be      secondary to hyperthyroidism.  Alternatively, they may be unrelated      to thyroid hormone disorder and associated with each other      (i.e.,malabsorption).  3. Family history of colon cancer.  4. Iron deficiency anemia.   RECOMMENDATIONS:  1. Additional stool studies to include ova and parasites, Giardia, and      qualitative fecal fat.  2. Expand laboratories to include tissue transglutaminase antibody,      erythrocyte sedimentation rate, and ferritin.  3. Schedule colonoscopy and upper endoscopy to evaluate chronic      diarrhea, iron deficiency anemia, weight loss, and provide      screening in a patient with a family history of colon cancer in a      first degree relative.  The nature of both procedures as well as       their risks, benefits, and alternatives were discussed in great      detail.  They understood and agreed to proceed.  4. Ongoing general medical care and management of thyroid disease Per      Dr. Drue Novel.     Wilhemina Bonito. Marina Goodell, MD  Electronically Signed    JNP/MedQ  DD: 06/08/2007  DT: 06/08/2007  Job #: [id number]   cc:   Willow Ora, MD

## 2011-04-08 NOTE — Consult Note (Signed)
[name], [name]                ACCOUNT NO.:  [id number]   MEDICAL RECORD NO.:  [id number]          PATIENT TYPE:  OUT   LOCATION:  GYN                          FACILITY:  [facility]   PHYSICIAN:  Judith A. Duard Brady, MD    DATE OF BIRTH:  Sep 27, 1935   DATE OF CONSULTATION:  DATE OF DISCHARGE:                                 CONSULTATION   HISTORY OF PRESENT ILLNESS:  [name] is a 76 year old gravida 1, para 1  who began having significant weight loss and was noted to have an  increased sed rate in July 2008.  At that time shown a CT scan of the  abdomen and pelvis.  Within the pelvis they noticed that the right ovary  was larger than the atrophic left ovary and it was recommended to set up  MRI of the pancreas and liver as well as imaging of the pelvis.  She  states that the remainder of her evaluation and examination was  essentially unremarkable.  She did have a colonoscopy and upper  endoscopy.  She had incidental diverticulosis, random biopsies of the  colon were negative.  Upper endoscopy was also negative, and random  biopsies of proximal small bowel were negative.  Due to this lack of  workup and elevated sed rate, a CT scan was performed as above.  She had  a 15 mm lesion in the tail of the pancreas with mild ductal dilatation.  An MRI was recommended and the patient declined.  Pelvic ultrasound was  performed to follow up the CT findings, and she was seen by Dr. Huntley Harris.  On pelvic ultrasound, she was noted to have a 5.2 cm right ovarian cyst,  echos and calcifications felt to be most consistent with a dermoid.  She  had a 1-cm endometrial stripe, a small uterus.  There was no free fluid  in the cul-de-sac.  There was no CA-125 drawn that I could ascertain.  Pap smear was performed that was negative, and she is referred for the  above.  She is otherwise doing quite well.  She states that she has  started to gain weight, and that weight loss that she experienced that  was  unintentional has subsequently stopped.   REVIEW OF SYSTEMS:  She denies any chest pain, shortness of breath,  nausea, vomiting, fevers, chills, headaches or visual changes.  She has  been menopausal since the age of 59.  She denies any bleeding.  She  denies any change in bowel or bladder habits.  Six months ago she states  she weighed 145 pounds.  She has gained approximately 5 pounds in the  last month.  She denies any chest pain, shortness of breath, headaches,  night sweats or any other constitutional symptoms.  She denies any early  satiety, increased abdominal girth.   PAST MEDICAL HISTORY:  Hypertension.   MEDICATIONS:  1. Diovan 160 mg daily.  2. Amlodipine 10 mg daily.  3. Synthroid 25 mcg daily.  4. Metoprolol 25 mg twice daily.   FAMILY HISTORY:  Her mother had diabetes.  She has a sister  with colon  cancer at age of 45, a brother with pancreatic cancer at age of 45.   ALLERGIES:  None.   PAST SURGICAL HISTORY:  None.   SOCIAL HISTORY:  She denies use of tobacco or alcohol.  She is retired  from Health and safety inspector.  She is up-to-date on her mammogram.  She had colonoscopy and upper endoscopy this past summer as mentioned  above.   PHYSICAL EXAMINATION:  VITAL SIGNS:  Weight 156 pounds, height 5 feet 6,  blood pressure 156/64, pulse 84.  GENERAL:  Well-nourished, well-developed female in no acute distress.  NECK:  Supple with no lymphadenopathy, no thyromegaly.  LUNGS:  Clear to auscultation bilaterally.  CARDIOVASCULAR:  Exam regular rate and rhythm.  ABDOMEN:  Soft, nontender, nondistended.  No palpable mass or positive  megaly.  There is no fluid wave.  Groins are negative for adenopathy.  EXTREMITIES:  No edema.  PELVIC:  External genitalia is within normal limits.  Vagina is markedly  atrophic with significant redundancy of the vaginal tissues.  The cervix  is visualized.  The cervix anterior lip was grasped with a single  toothed tenaculum.  With  an os finder, I was able to enter the  endocervical  and endometrial canal without difficulty.  Endometrial  biopsy Pipelle was then passed without difficulty.  The corpus sounded  to 6 cm.  Two passes were obtained with minimal tissue.  Bimanual  examination of the cervix is palpably normal.  The corpus of normal in  size, shape and consistency.  I cannot appreciate a 5 cm adnexal mass.  RECTAL:  Confirms no nodularity or masses.   ASSESSMENT:  A 76 year old with a thickened endometrial stripe on  ultrasound and a 5 cm right ovarian cystic lesion which I cannot  appreciate on exam.   PLAN:  Follow up results for endometrial biopsy and her CA-125 from  today.  Pending these results will discussed with the patient  consideration of surgery.      Judith A. Duard Brady, MD  Electronically Signed     PAG/MEDQ  D:  09/22/2007  T:  09/23/2007  Job:  [id number]   cc:   [name], M.D.  Fax: [phone]   Judith Ora, MD  [phone] [place], [place] [address]

## 2011-04-08 NOTE — Assessment & Plan Note (Signed)
[name]                         GASTROENTEROLOGY OFFICE NOTE   NAME:[name], [name]                       MRN:          [id number]  DATE:09/15/2007                            DOB:          1935-09-20    HISTORY:  Patient presents today for followup.  She has been followed in  recent months for diarrhea and weight-loss.  She underwent upper  endoscopy and colonoscopy with no significant abnormalities noted.  Blood work has been remarkable for a normocytic anemia and significantly  elevated erythrocyte sedimentation rate.  Her diarrhea and weight-loss  have been felt to be secondary to medication-related hyperthyroidism.  With adjustment in her medication, her GI symptoms have resolved.  Specifically, she is having no further problems with diarrhea and she  has had gradual weight-gain.  No active GI complaints.   At the time of her last visit, July 15, 2007, repeat laboratories  again demonstrated anemia with hemoglobin of 9.9 and an elevated  sedimentation rate at 77.  She was instructed to follow up with Dr. Drue Novel,  but has yet to do so.  Because of an abnormality of the ovary on CT  scan, she was referred to Dr. Henderson Cloud.  In-office endometrial biopsy was  not possible.  She is anticipating consultation with a GYN oncologist  next week.  We also discussed abnormalities of the pancreas,  demonstrated on the scan.  These were of uncertain clinical  significance.  MRI was recommended to the patient.  She declined.  Currently, she reports feeling well with no active GI complaints.   MEDICATIONS INCLUDE:  Metoprolol, Diovan, amlodipine and levothyroxine.   ALLERGIES:  She has no known drug allergies.   PHYSICAL EXAM:  Finds a well-appearing female, in no acute distress.  Blood pressure is 118/64, heart rate is 68 and regular, weight is 127  pounds (increased 6 pounds).  HEENT:  Sclerae are anicteric.  ABDOMEN:  Soft, without tenderness, mass or  hernia.   IMPRESSION:  1. Problems with diarrhea and weight-loss secondary to iatrogenic      hyperthyroidism, now improved after adjustment of thyroid      medication.  2. Anemia and elevated sedimentation rate.  Cause uncertain.  Patient      has been strictly advised to return to Dr. Drue Novel for additional      evaluation.  3. Ovarian abnormality on CT scan of the pelvis.  Under the care of      Dr. Henderson Cloud.  4. Undefined abnormality of the pancreas on CT scan.  MRI again      recommended.  Patient has declined in lieu of other issues that      need evaluation.  As such, she has agreed to      return to this office in about six months at which time we can      reconsider additional evaluation.  In the interim, she will resume      her general medical care with Dr. Drue Novel.     [name]. Judith Goodell, MD  Electronically Signed    JNP/MedQ  DD: 09/15/2007  DT: 09/15/2007  Job #: [id number]   cc:   Willow Ora, MD  Guy Sandifer. Henderson Cloud, M.D.

## 2011-04-08 NOTE — Assessment & Plan Note (Signed)
[facility]                         GASTROENTEROLOGY OFFICE NOTE   NAME:[name], [name]                       MRN:          [id number]  DATE:07/15/2007                            DOB:          01-02-1935    HISTORY:  [name] presents today for followup.  She is a 76 year old  with hypertension and hypothyroidism who was initially evaluated on June 07, 2007, for diarrhea and weight loss.  See that dictation for details.  She was also noted to be anemic.  Of importance, she was found to have  iatrogenic hyperthyroidism.  In terms of her workup, she was to have  submitted stool studies but failed to do so.  Blood work as obtained.  Her hemoglobin was 8.8 with a normal MCV.  Sedimentation rate elevated  at 79.  Ferritin normal at 157.8.  Tissue transglutaminase antibody also  normal.  Colonoscopy and upper endoscopy were performed on June 21, 2007.  Colonoscopy including intubation of the terminal ileum was  entirely normal except for incidental diverticulosis.  Random biopsies  of the colon were normal.  Upper endoscopy was also normal.  Random  biopsies of the proximal small intestine were unremarkable.  Due to a  lack of findings as well as an elevated sedimentation rate and weight  loss, the patient was set up for a CT scan of the abdomen and pelvis on  June 24, 2007.  The abdomen revealed a 15-mm lesion in the tail of the  pancreas with mild ductal dilation.  MRI was recommended.  The patient  declined.  She was also noted to have asymmetric ovaries with the right  greater than the left with 2 cystic lesions within the body.  Pelvic  ultrasound of the right ovary was recommended to exclude neoplasm.  I  recommended that she see a gynecologist.  She presents today for  followup.  Her thyroid medicine was adjusted.  She reports doing better  with no problems with diarrhea.  She has actually gained a few pounds.  No complaints or other issues.   CURRENT MEDICATIONS:  1. Metoprolol 25 mg b.i.d.  2. Diovan 160 mg daily.  3. Amlodipine 10 mg daily.  4. Levothyroxine 88 mcg daily.   PHYSICAL EXAMINATION:  GENERAL:  Finds a well-appearing female in no  acute distress.  VITAL SIGNS:  Blood pressure is 124/68, heart rate is 80, weight is 121  pounds (increased 2 pounds).  HEENT:  Sclerae are anicteric.  ABDOMEN:  Soft without tenderness, mass, or hernia.   IMPRESSION:  1. Problems with diarrhea and weight loss, quite possibly due to      iatrogenic hyperthyroidism.  Symptoms improved after adjustment of      thyroid medication.  Negative colonoscopy and endoscopy as      described above.  2. Anemia and elevated sedimentation rate.  Significance uncertain.      These need to be repeated today.  If still abnormal, she should      return to Dr. Drue Novel as this could represent giant cell arteritis.  3. Ovarian abnormality on CT of the pelvis  as described above.  This      may represent benign disease but it should certainly be formally      evaluated.  I told her that she should see a gynecologist.  She      does not have a regular gynecologist but her daughter is a patient      of Dr. Henderson Cloud. She would like for her mother to see Dr. Henderson Cloud.      We have arranged an appointment for September 9th at 11 a.m.  4. Abnormality of the pancreas as described above on abdominal CT      scan.  MRI again recommended.  The patient has again declined at      this time.   RECOMMENDATIONS:  1. Repeat CBC and erythrocyte sedimentation rate.  2. Begin multivitamin with iron.  3. Appointment with Dr. Henderson Cloud as discussed above.  4. Resume general medical care with Dr. Drue Novel.  5. GI office followup in 2 months to assure ongoing improvement.     [name]. Marina Goodell, MD  Electronically Signed    JNP/MedQ  DD: 07/15/2007  DT: 07/16/2007  Job #: [id number]   cc:   Willow Ora, MD  Guy Sandifer. Henderson Cloud, M.D.

## 2011-05-19 ENCOUNTER — Other Ambulatory Visit: Payer: Self-pay | Admitting: Internal Medicine

## 2011-08-07 ENCOUNTER — Encounter: Payer: Self-pay | Admitting: Internal Medicine

## 2011-08-08 ENCOUNTER — Encounter: Payer: Self-pay | Admitting: Internal Medicine

## 2011-08-08 ENCOUNTER — Ambulatory Visit (INDEPENDENT_AMBULATORY_CARE_PROVIDER_SITE_OTHER): Payer: Medicare PPO | Admitting: Internal Medicine

## 2011-08-08 DIAGNOSIS — E0590 Thyrotoxicosis, unspecified without thyrotoxic crisis or storm: Secondary | ICD-10-CM

## 2011-08-08 DIAGNOSIS — I10 Essential (primary) hypertension: Secondary | ICD-10-CM

## 2011-08-08 DIAGNOSIS — D649 Anemia, unspecified: Secondary | ICD-10-CM

## 2011-08-08 DIAGNOSIS — Z0000 Encounter for general adult medical examination without abnormal findings: Secondary | ICD-10-CM | POA: Insufficient documentation

## 2011-08-08 DIAGNOSIS — Z1231 Encounter for screening mammogram for malignant neoplasm of breast: Secondary | ICD-10-CM

## 2011-08-08 MED ORDER — VALSARTAN 160 MG PO TABS: 160.0000 mg | ORAL_TABLET | Freq: Every day | ORAL | Status: DC

## 2011-08-08 MED ORDER — AMLODIPINE BESYLATE 10 MG PO TABS: 10.0000 mg | ORAL_TABLET | Freq: Every day | ORAL | Status: DC

## 2011-08-08 NOTE — Assessment & Plan Note (Signed)
Non iron def anemia, up date on cscope Last Hg ok (at renal 02-2011)

## 2011-08-08 NOTE — Assessment & Plan Note (Addendum)
Td--2011 pneumonia shot-- declined last year, explained benefits again Explained benefits of zostavax and flu shot  cscope  2008, no polyps, repeat in 5 years due to family history   has seen Dr Henderson Cloud before, not seen  in a while , was rec to call him but didn't; offered referal, declined . States she will call Needs a mammogram, agreed to let me schedule  family history coronary artery disease, good compliance w/  aspirin 81 mg daily Reports a DEXA (normal) when she saw Dr Talmage Nap, [place] next year Counseled about diet and exercise

## 2011-08-08 NOTE — Assessment & Plan Note (Addendum)
No change for now , rf meds

## 2011-08-08 NOTE — Assessment & Plan Note (Signed)
To see nephrology in 1 month, stable per last bmp at renal 02-2011

## 2011-08-08 NOTE — Progress Notes (Signed)
  Subjective:    Patient ID: [name], female    DOB: 1935/04/03, 76 y.o.   MRN: [id number]  HPI Here for Medicare AWV: 1. Risk factors based on Past M, S, F history: reviewed 2. Physical Activities: sedentary (denies pain or other problems)   3. Depression/mood: No problems noted or reported 4. Hearing:  No problems noted or reported 5. ADL's:  Independent, still drives  6. Fall Risk: no recent falls , average risk, prevention discussed  7. home Safety: does feel safe at home  8. Height, weight, &visual acuity: see VS, no vision problems , sees eye doc  9. Counseling: provided 10. Labs ordered based on risk factors: if needed  11. Referral Coordination: if needed 12.  Care Plan, see assessment and plan  13.   Cognitive Assessment: Cognition and motor skills appropriate for age  In addition, today we discussed the following: Hypertension-she needs refills,  good medication compliance, ambulatory blood pressures in the 150-79. Thyroid disease, sees Dr. Talmage Harris routinely. Chronic renal insufficiency--labs and ov notes from nephrology reviewed. Stable.  Past Medical History: Reviewed history from 09/23/2010 and no changes required. G1 P1 Hypertension Heperthyroidism, s/p ablation, sees Dr [name]  DERMATITIS  DIVERTICULOSIS, COLON  h/o elevated sed rate ABNORMAL  CT and MRI of pancreas, last MRI 1-09: after d/w GI we rec.  to f/u clinically and redo XRs if problems h/o ANEMIA  elevated creatinine 4-11: stage 3 CKD, saw  nephrology, history of solitary kidney h/o Gout   Past Surgical History: h/o thick endometrium , s/p Bx 2008   Social History: Divorced lives with her sister  Has one daughter who lives here in town tobacco-- never ETOH-- never  diet-- not paying attention  exercise-- no  Family History  Problem Relation Age of Onset  . Diabetes Mother   . Heart attack Sister   . Cancer Sister     colon  . Heart attack Brother      Review of Systems    Constitutional: Negative for fever and fatigue.  Respiratory: Negative for cough and shortness of breath.   Cardiovascular: Negative for chest pain and leg swelling.  Gastrointestinal: Negative for abdominal pain and blood in stool.  Genitourinary: Negative for dysuria and hematuria.       Objective:   Physical Exam  Constitutional: She is oriented to person, place, and time. She appears well-developed and well-nourished.  HENT:  Head: Normocephalic and atraumatic.  Neck: No thyromegaly present.  Cardiovascular: Normal rate, regular rhythm and normal heart sounds.   No murmur heard. Pulmonary/Chest: Effort normal and breath sounds normal. No respiratory distress. She has no wheezes. She has no rales.  Musculoskeletal: She exhibits no edema.  Neurological: She is alert and oriented to person, place, and time.  Skin: Skin is warm and dry.  Psychiatric: She has a normal mood and affect. Her behavior is normal. Judgment and thought content normal.          Assessment & Plan:

## 2011-08-08 NOTE — Assessment & Plan Note (Signed)
Per endocrinology 

## 2011-08-08 NOTE — Patient Instructions (Signed)
Check the  blood pressure 2 or 3 times a week, be sure it is less than 140/85. If it is consistently higher, let me know  

## 2011-08-18 ENCOUNTER — Telehealth: Payer: Self-pay | Admitting: Internal Medicine

## 2011-08-18 DIAGNOSIS — Z1231 Encounter for screening mammogram for malignant neoplasm of breast: Secondary | ICD-10-CM

## 2011-08-18 NOTE — Telephone Encounter (Signed)
Ordered a lipid panel and a CBC:  08-08-11, done? Results?

## 2011-08-20 NOTE — Telephone Encounter (Signed)
Will check with lab.

## 2011-08-21 NOTE — Telephone Encounter (Signed)
Per [name] [lab], patient did not stop by lab after OV Patient informed; scheduled for Tuesday 10/02/12012 @ 09:15am

## 2011-08-25 ENCOUNTER — Other Ambulatory Visit: Payer: Self-pay | Admitting: Internal Medicine

## 2011-08-26 ENCOUNTER — Other Ambulatory Visit (INDEPENDENT_AMBULATORY_CARE_PROVIDER_SITE_OTHER): Payer: Medicare PPO

## 2011-08-26 DIAGNOSIS — E0590 Thyrotoxicosis, unspecified without thyrotoxic crisis or storm: Secondary | ICD-10-CM

## 2011-08-26 DIAGNOSIS — I10 Essential (primary) hypertension: Secondary | ICD-10-CM

## 2011-08-26 DIAGNOSIS — Z0000 Encounter for general adult medical examination without abnormal findings: Secondary | ICD-10-CM

## 2011-08-26 LAB — CBC WITH DIFFERENTIAL/PLATELET
Basophils Absolute: 0 10*3/uL (ref 0.0–0.1)
Eosinophils Relative: 1.7 % (ref 0.0–5.0)
Lymphocytes Relative: 33.3 % (ref 12.0–46.0)
Monocytes Relative: 5.9 % (ref 3.0–12.0)
Neutrophils Relative %: 58.6 % (ref 43.0–77.0)
Platelets: 357 10*3/uL (ref 150.0–400.0)
RDW: 16 % — ABNORMAL HIGH (ref 11.5–14.6)
WBC: 6.2 10*3/uL (ref 4.5–10.5)

## 2011-08-26 LAB — LIPID PANEL
Cholesterol: 124 mg/dL (ref 0–200)
LDL Cholesterol: 58 mg/dL (ref 0–99)
Triglycerides: 82 mg/dL (ref 0.0–149.0)
VLDL: 16.4 mg/dL (ref 0.0–40.0)

## 2011-08-26 NOTE — Progress Notes (Signed)
Labs only

## 2011-08-29 NOTE — Progress Notes (Signed)
Quick Note:    Labs mailed.  ______

## 2012-07-02 ENCOUNTER — Encounter: Payer: Self-pay | Admitting: Internal Medicine

## 2012-08-09 ENCOUNTER — Ambulatory Visit (INDEPENDENT_AMBULATORY_CARE_PROVIDER_SITE_OTHER): Payer: Medicare PPO | Admitting: Internal Medicine

## 2012-08-09 ENCOUNTER — Encounter: Payer: Self-pay | Admitting: Internal Medicine

## 2012-08-09 VITALS — BP 142/80 | HR 70 | Temp 97.5°F | Ht 64.5 in | Wt 158.0 lb

## 2012-08-09 DIAGNOSIS — D649 Anemia, unspecified: Secondary | ICD-10-CM

## 2012-08-09 DIAGNOSIS — E0590 Thyrotoxicosis, unspecified without thyrotoxic crisis or storm: Secondary | ICD-10-CM

## 2012-08-09 DIAGNOSIS — I10 Essential (primary) hypertension: Secondary | ICD-10-CM

## 2012-08-09 DIAGNOSIS — Z0000 Encounter for general adult medical examination without abnormal findings: Secondary | ICD-10-CM

## 2012-08-09 MED ORDER — LEVOTHYROXINE SODIUM 75 MCG PO TABS: 75.0000 ug | ORAL_TABLET | Freq: Every day | ORAL | Status: DC

## 2012-08-09 MED ORDER — AMLODIPINE BESYLATE 10 MG PO TABS: 10.0000 mg | ORAL_TABLET | Freq: Every day | ORAL | Status: DC

## 2012-08-09 MED ORDER — VALSARTAN 160 MG PO TABS: 160.0000 mg | ORAL_TABLET | Freq: Every day | ORAL | Status: DC

## 2012-08-09 MED ORDER — FUROSEMIDE 20 MG PO TABS: 20.0000 mg | ORAL_TABLET | Freq: Every day | ORAL | Status: DC

## 2012-08-09 NOTE — Assessment & Plan Note (Addendum)
Labs at nephrology 06-2012, hemoglobin 11.6, iron normal. Stable

## 2012-08-09 NOTE — Patient Instructions (Addendum)
Start Lasix 20 mg one daily for BP Check the  blood pressure 2 or 3 times a week, be sure it is between 110/60 and 130/85. If it is consistently higher or lower, let me know Schedule labs before you leave, to be done in 3 weeks: BMP--- hypertension FLP---- hypertension, CRI Next visit in 6 months for a regular checkup

## 2012-08-09 NOTE — Assessment & Plan Note (Addendum)
Td--2011 Pneumonia, flu, shingles shot discussed ,explained benefits again: declined   cscope  2008, no polyps, due for a Cscope, will refer her to Dr Marina Goodell   has seen Dr Henderson Cloud before, not seen  in a while. H/o several PAPs before, never had an abnormal one, married once, low risk; not interested in further PAPs No recent MMG, breast exam done today and normal, ordering a MMG; if normal, most likely won't need any further screening  family history coronary artery disease, good compliance w/  aspirin 81 mg daily Reports a DEXA (normal) @ Dr Talmage Nap  Counseled about diet and exercise

## 2012-08-09 NOTE — Progress Notes (Signed)
Subjective:    Patient ID: [name], female    DOB: [birth year], 77 y.o.   MRN: [id number]  HPI  HPI Here for Medicare AWV: 1. Risk factors based on Past M, S, F history: reviewed 2. Physical Activities: more active, walks 30 min a day 3. Depression/mood: No problems noted or reported 4. Hearing:  No problems noted or reported 5. ADL's:  Independent, still drives   6. Fall Risk: no recent falls , prevention discussed   7. home Safety: does feel safe at home   8. Height, weight, &visual acuity: see VS, no vision problems , sees Dr Elmer Picker, glaucoma suspect   9. Counseling: provided 10. Labs ordered based on risk factors: if needed   11. Referral Coordination: if needed 12.  Care Plan, see assessment and plan   13.   Cognitive Assessment: Cognition and motor skills appropriate for age  In addition, today we discussed the following: Hypothyroidism, good medication compliance Hypertension, good medication compliance, BP on average in the 148/72. History of hypothyroidism and renal failure, sees endocrinology and nephrology from time to time.  Past Medical History: G1 P1 Hypertension CRI--elevated creatinine 4-11: stage 3 CKD, sees nephrology, history of solitary kidney Heperthyroidism, s/p ablation, sees Dr Talmage Nap   H/o Gout Glaucoma suspect DERMATITIS   DIVERTICULOSIS, COLON   h/o elevated sed rate ABNORMAL  CT and MRI of pancreas, last MRI 1-09: after d/w GI we rec.  to f/u clinically and redo XRs if problems h/o ANEMIA    Past Surgical History: h/o thick endometrium , s/p Bx 2008    Social History: Divorced, lives with her sister ,  one daughter who lives here in town tobacco-- never ETOH-- never   diet-- regular    exercise-- walks 30 min a day    FH DM--M CAD-- sister (at age late 78s) Colon ca-- sister at her late 60s breast ca-- no    Review of Systems No chest pain or shortness of breath. No lower extremity edema or cough. No nausea, vomiting,  diarrhea or blood in the stools. No dysuria gross hematuria.  Current Outpatient Rx  Name Route Sig Dispense Refill  . AMLODIPINE BESYLATE 10 MG PO TABS Oral Take 1 tablet (10 mg total) by mouth daily. 90 tablet 3  . ASPIRIN 81 MG PO TABS Oral Take 81 mg by mouth daily.      Marland Kitchen LEVOTHYROXINE SODIUM 75 MCG PO TABS Oral Take 1 tablet (75 mcg total) by mouth daily. 90 tablet 3  . VALSARTAN 160 MG PO TABS Oral Take 1 tablet (160 mg total) by mouth daily. 90 tablet 3  . FUROSEMIDE 20 MG PO TABS Oral Take 1 tablet (20 mg total) by mouth daily. 30 tablet 3      Objective:   Physical Exam  General -- alert, well-developed, and well-nourished.   Neck --no thyromegaly  Breasts-- No mass, nodules, thickening, tenderness, bulging, retraction, inflamation, nipple discharge or skin changes noted.  no axillary lymph nodes Lungs -- normal respiratory effort, no intercostal retractions, no accessory muscle use, and normal breath sounds.   Heart-- normal rate, regular rhythm, no murmur, and no gallop.   Abdomen--soft, non-tender, no distention, no masses, no HSM, no guarding, and no rigidity.   Extremities-- no pretibial edema bilaterally Neurologic-- alert & oriented X3 and strength normal in all extremities. Psych-- Cognition and judgment appear intact. Alert and cooperative with normal attention span and concentration.  not anxious appearing and not depressed appearing.  Assessment & Plan:

## 2012-08-09 NOTE — Assessment & Plan Note (Signed)
BP today slightly elevated, BP at nephrology last month 150/74 Plan: Add Lasix 20 mg, BMP in 3 weeks.

## 2012-08-09 NOTE — Assessment & Plan Note (Signed)
Reports she sees Dr. Talmage Nap  from time to time

## 2012-08-10 ENCOUNTER — Encounter: Payer: Self-pay | Admitting: Internal Medicine

## 2012-08-14 ENCOUNTER — Other Ambulatory Visit: Payer: Self-pay | Admitting: Internal Medicine

## 2012-08-19 ENCOUNTER — Ambulatory Visit
Admission: RE | Admit: 2012-08-19 | Discharge: 2012-08-19 | Disposition: A | Discharge status: Home / Self Care | Payer: Medicare Other | Source: Ambulatory Visit | Attending: Internal Medicine | Admitting: Internal Medicine

## 2012-08-19 DIAGNOSIS — Z0000 Encounter for general adult medical examination without abnormal findings: Secondary | ICD-10-CM

## 2012-08-19 DIAGNOSIS — Z1231 Encounter for screening mammogram for malignant neoplasm of breast: Secondary | ICD-10-CM

## 2012-08-24 LAB — HM COLONOSCOPY

## 2012-08-30 ENCOUNTER — Other Ambulatory Visit (INDEPENDENT_AMBULATORY_CARE_PROVIDER_SITE_OTHER): Payer: Medicare Other

## 2012-08-30 DIAGNOSIS — N189 Chronic kidney disease, unspecified: Secondary | ICD-10-CM

## 2012-08-30 DIAGNOSIS — I10 Essential (primary) hypertension: Secondary | ICD-10-CM

## 2012-08-30 LAB — BASIC METABOLIC PANEL
Calcium: 9.4 mg/dL (ref 8.4–10.5)
GFR: 31.2 mL/min — ABNORMAL LOW (ref >60.00)
Glucose, Bld: 84 mg/dL (ref 70–99)
Sodium: 138 mEq/L (ref 135–145)

## 2012-08-30 LAB — LIPID PANEL
Cholesterol: 111 mg/dL (ref 0–200)
HDL: 42.2 mg/dL (ref >39.00)

## 2012-09-01 ENCOUNTER — Telehealth: Payer: Self-pay

## 2012-09-01 NOTE — Telephone Encounter (Signed)
Pt called and left message on triage line stating OV 08/09/12 billed to wrong insurance. I called pt back and gave them [phone] to call for billing. Pt stated understanding.        MW

## 2012-09-02 ENCOUNTER — Ambulatory Visit (AMBULATORY_SURGERY_CENTER): Payer: Medicare Other | Admitting: *Deleted

## 2012-09-02 ENCOUNTER — Encounter: Payer: Self-pay | Admitting: Internal Medicine

## 2012-09-02 VITALS — Ht 65.0 in | Wt 157.0 lb

## 2012-09-02 DIAGNOSIS — Z1211 Encounter for screening for malignant neoplasm of colon: Secondary | ICD-10-CM

## 2012-09-02 MED ORDER — MOVIPREP 100 G PO SOLR: ORAL | Status: DC

## 2012-09-14 ENCOUNTER — Telehealth: Payer: Self-pay | Admitting: Internal Medicine

## 2012-09-14 NOTE — Telephone Encounter (Signed)
Advised the pt not to eat any more of the foods on the limitation diet and drink plenty of fluids.  Pt. Verbalized understanding. [name]

## 2012-09-16 ENCOUNTER — Ambulatory Visit (AMBULATORY_SURGERY_CENTER): Payer: Medicare Other | Admitting: Internal Medicine

## 2012-09-16 ENCOUNTER — Encounter: Payer: Self-pay | Admitting: Internal Medicine

## 2012-09-16 VITALS — BP 125/52 | HR 63 | Temp 96.8°F | Resp 25 | Ht 65.0 in | Wt 157.0 lb

## 2012-09-16 DIAGNOSIS — Z800 Family history of malignant neoplasm of digestive organs: Secondary | ICD-10-CM

## 2012-09-16 DIAGNOSIS — D126 Benign neoplasm of colon, unspecified: Secondary | ICD-10-CM

## 2012-09-16 DIAGNOSIS — Z1211 Encounter for screening for malignant neoplasm of colon: Secondary | ICD-10-CM

## 2012-09-16 DIAGNOSIS — K5730 Diverticulosis of large intestine without perforation or abscess without bleeding: Secondary | ICD-10-CM

## 2012-09-16 MED ORDER — SODIUM CHLORIDE 0.9 % IV SOLN: 500.0000 mL | INTRAVENOUS | Status: DC

## 2012-09-16 NOTE — Op Note (Signed)
[facility] [address] [place], [address]   COLONOSCOPY PROCEDURE REPORT  PATIENT: [name]  MR#: [id number] BIRTHDATE: 16-Jan-1935 , 77  yrs. old GENDER: Female ENDOSCOPIST: Roxy Cedar, MD REFERRED XB:[id number] Recall PROCEDURE DATE:  09/16/2012 PROCEDURE:   Colonoscopy with snare polypectomy    x 2 ASA CLASS:   Class II INDICATIONS:patient's immediate family history of colon cancer (sister 75). Negative index exam 05-2007. MEDICATIONS: MAC sedation, administered by CRNA and propofol (Diprivan) 120mg  IV  DESCRIPTION OF PROCEDURE:   After the risks benefits and alternatives of the procedure were thoroughly explained, informed consent was obtained.  A digital rectal exam revealed no abnormalities of the rectum.   The LB CF-[id number]  endoscope was introduced through the anus and advanced to the cecum, which was identified by both the appendix and ileocecal valve. No adverse events experienced.   The quality of the prep was excellent, using MoviPrep  The instrument was then slowly withdrawn as the colon was fully examined.      COLON FINDINGS: Two polyps ranging between 3-86mm in size were found in the ascending colon.  A polypectomy was performed with a cold snare.  The resection was complete and the polyp tissue was completely retrieved.   Moderate diverticulosis was noted in the ascending colon.   The colon mucosa was otherwise normal. Retroflexed views revealed no abnormalities. The time to cecum=1 minutes 58 seconds.  Withdrawal time=12 minutes 41 seconds.  The scope was withdrawn and the procedure completed. COMPLICATIONS: There were no complications.  ENDOSCOPIC IMPRESSION: 1.   Two polyps ranging between 3-65mm in size were found in the ascending colon; polypectomy was performed with a cold snare 2.   Moderate diverticulosis was noted in the ascending colon 3.   The colon mucosa was otherwise normal  RECOMMENDATIONS: 1.Follow  up colonoscopy in 5 years   eSigned:  Roxy Cedar, MD 09/16/2012 11:50 AM   cc: Willow Ora, MD and The Patient   PATIENT NAME:  [name], [name] MR#: [id number]

## 2012-09-16 NOTE — Progress Notes (Signed)
Propofol per Dwan Bolt CRNA, see scanned intra procedure report. All meds titrated per CRNA during procedure. ewm

## 2012-09-16 NOTE — Patient Instructions (Signed)
YOU HAD AN ENDOSCOPIC PROCEDURE TODAY AT THE [facility]: Refer to the procedure report that was given to you for any specific questions about what was found during the examination.  If the procedure report does not answer your questions, please call your gastroenterologist to clarify.  If you requested that your care partner not be given the details of your procedure findings, then the procedure report has been included in a sealed envelope for you to review at your convenience later.  YOU SHOULD EXPECT: Some feelings of bloating in the abdomen. Passage of more gas than usual.  Walking can help get rid of the air that was put into your GI tract during the procedure and reduce the bloating. If you had a lower endoscopy (such as a colonoscopy or flexible sigmoidoscopy) you may notice spotting of blood in your stool or on the toilet paper. If you underwent a bowel prep for your procedure, then you may not have a normal bowel movement for a few days.  DIET: Your first meal following the procedure should be a light meal and then it is ok to progress to your normal diet.  A half-sandwich or bowl of soup is an example of a good first meal.  Heavy or fried foods are harder to digest and may make you feel nauseous or bloated.  Likewise meals heavy in dairy and vegetables can cause extra gas to form and this can also increase the bloating.  Drink plenty of fluids but you should avoid alcoholic beverages for 24 hours.  ACTIVITY: Your care partner should take you home directly after the procedure.  You should plan to take it easy, moving slowly for the rest of the day.  You can resume normal activity the day after the procedure however you should NOT DRIVE or use heavy machinery for 24 hours (because of the sedation medicines used during the test).    SYMPTOMS TO REPORT IMMEDIATELY: A gastroenterologist can be reached at any hour.  During normal business hours, 8:30 AM to 5:00 PM Monday through Friday,  call [phone].  After hours and on weekends, please call the GI answering service at [phone] who will take a message and have the physician on call contact you.   Following lower endoscopy (colonoscopy or flexible sigmoidoscopy):  Excessive amounts of blood in the stool  Significant tenderness or worsening of abdominal pains  Swelling of the abdomen that is new, acute  Fever of 100F or higher  Following upper endoscopy (EGD)  Vomiting of blood or coffee ground material  New chest pain or pain under the shoulder blades  Painful or persistently difficult swallowing  New shortness of breath  Fever of 100F or higher  Black, tarry-looking stools  FOLLOW UP: If any biopsies were taken you will be contacted by phone or by letter within the next 1-3 weeks.  Call your gastroenterologist if you have not heard about the biopsies in 3 weeks.  Our staff will call the home number listed on your records the next business day following your procedure to check on you and address any questions or concerns that you may have at that time regarding the information given to you following your procedure. This is a courtesy call and so if there is no answer at the home number and we have not heard from you through the emergency physician on call, we will assume that you have returned to your regular daily activities without incident.  SIGNATURES/CONFIDENTIALITY: You and/or your care   partner have signed paperwork which will be entered into your electronic medical record.  These signatures attest to the fact that that the information above on your After Visit Summary has been reviewed and is understood.  Full responsibility of the confidentiality of this discharge information lies with you and/or your care-partner.  

## 2012-09-16 NOTE — Progress Notes (Signed)
Patient did not experience any of the following events: a burn prior to discharge; a fall within the facility; wrong site/side/patient/procedure/implant event; or a hospital transfer or hospital admission upon discharge from the facility. (G8907) Patient did not have preoperative order for IV antibiotic SSI prophylaxis. (G8918)  

## 2012-09-17 ENCOUNTER — Telehealth: Payer: Self-pay | Admitting: *Deleted

## 2012-09-17 NOTE — Telephone Encounter (Signed)
  Follow up Call-  Call back number 09/16/2012  Post procedure Call Back phone  # [phone]  Permission to leave phone message Yes     Patient questions:  Do you have a fever, pain , or abdominal swelling? no Pain Score  0 *  Have you tolerated food without any problems? yes  Have you been able to return to your normal activities? yes  Do you have any questions about your discharge instructions: Diet   no Medications  no Follow up visit  no  Do you have questions or concerns about your Care? no  Actions: * If pain score is 4 or above: No action needed, pain <4.

## 2012-09-21 ENCOUNTER — Encounter: Payer: Self-pay | Admitting: Internal Medicine

## 2012-10-19 ENCOUNTER — Telehealth: Payer: Self-pay | Admitting: Internal Medicine

## 2012-10-19 DIAGNOSIS — H409 Unspecified glaucoma: Secondary | ICD-10-CM

## 2012-10-19 NOTE — Telephone Encounter (Signed)
Letter from ophthalmology, Dr. Elmer Picker review it. She has chronic angle-closure glaucoma, needs labs to rule out underlying pathology: B12, folic acid, hemoglobin and iron. Chart reviewed, Labs at nephrology 06-2012, hemoglobin 11.6, iron normal Please arrange for a B12 and folic acid --dx  glaucoma  we'll forward results to ophtalmology (fax [phone] )

## 2012-10-20 NOTE — Telephone Encounter (Signed)
Discussed with pt. Schedule lab appt 12.1.13

## 2012-10-23 ENCOUNTER — Encounter (HOSPITAL_COMMUNITY): Payer: Self-pay | Admitting: Emergency Medicine

## 2012-10-23 ENCOUNTER — Emergency Department (HOSPITAL_COMMUNITY): Payer: Medicare Other

## 2012-10-23 ENCOUNTER — Inpatient Hospital Stay (HOSPITAL_COMMUNITY)
Admission: EM | Admit: 2012-10-23 | Discharge: 2012-10-28 | DRG: 494 | Disposition: A | Discharge status: Skilled Nursing Facility | Payer: Medicare Other | Attending: Specialist | Admitting: Specialist

## 2012-10-23 DIAGNOSIS — M109 Gout, unspecified: Secondary | ICD-10-CM | POA: Diagnosis present

## 2012-10-23 DIAGNOSIS — W010XXA Fall on same level from slipping, tripping and stumbling without subsequent striking against object, initial encounter: Secondary | ICD-10-CM | POA: Diagnosis present

## 2012-10-23 DIAGNOSIS — I10 Essential (primary) hypertension: Secondary | ICD-10-CM | POA: Diagnosis present

## 2012-10-23 DIAGNOSIS — D649 Anemia, unspecified: Secondary | ICD-10-CM | POA: Diagnosis present

## 2012-10-23 DIAGNOSIS — Y92009 Unspecified place in unspecified non-institutional (private) residence as the place of occurrence of the external cause: Secondary | ICD-10-CM

## 2012-10-23 DIAGNOSIS — Z8719 Personal history of other diseases of the digestive system: Secondary | ICD-10-CM

## 2012-10-23 DIAGNOSIS — S82143A Displaced bicondylar fracture of unspecified tibia, initial encounter for closed fracture: Secondary | ICD-10-CM

## 2012-10-23 DIAGNOSIS — E0590 Thyrotoxicosis, unspecified without thyrotoxic crisis or storm: Secondary | ICD-10-CM | POA: Diagnosis present

## 2012-10-23 DIAGNOSIS — S82122A Displaced fracture of lateral condyle of left tibia, initial encounter for closed fracture: Secondary | ICD-10-CM | POA: Diagnosis present

## 2012-10-23 DIAGNOSIS — S82109A Unspecified fracture of upper end of unspecified tibia, initial encounter for closed fracture: Principal | ICD-10-CM | POA: Diagnosis present

## 2012-10-23 NOTE — ED Notes (Signed)
WUJ:[id number]<JY> Expected date:<BR> Expected time:<BR> Means of arrival:<BR> Comments:<BR> HOLD

## 2012-10-23 NOTE — ED Provider Notes (Signed)
[name] is a 77 y.o. female who fell when she was walking from a concrete to a grass area. She struck her left knee. She was unable to get up secondary to left knee pain. There were no other injuries. She has pain with bearing weight on the left leg. She came here by private vehicle. Eye exam, she is alert, cooperative. Left knee is swollen. Normal range of motion left hip, and left ankle. She can't straighten her left knee, somewhat. The extensor mechanism is intact. She has lateral tenderness of the left knee.  Recommended treatment: Watson-Jones, and knee immobilizer, with partial weightbearing using a walker.  We'll contact orthopedics prior to discharge, to confirm the treatment plan and arrange followup care.   Medical screening examination/treatment/procedure(s) were conducted as a shared visit with non-physician practitioner(s) and myself.  I personally evaluated the patient during the encounter  Flint Melter, MD 10/24/12 1214

## 2012-10-23 NOTE — Progress Notes (Signed)
Orthopedic Tech Progress Note Patient Details:  [name] [birth year] [id number] Knee Immobilizer applied to Left LE with instructions. Patient tolerated well.  Ortho Devices Type of Ortho Device: Knee Immobilizer Ortho Device/Splint Location: Left LE Ortho Device/Splint Interventions: Application   Asia R Thompson 10/23/2012, 11:03 PM

## 2012-10-23 NOTE — ED Notes (Signed)
Patient states that she feel onto her left knee about 30 mintues ago. The patient is unable to stand on her knee

## 2012-10-23 NOTE — H&P (Signed)
[name] is an 77 y.o. female.   Chief Complaint: Knee pain HPI: 77 year old female and went out to the road this evening and slipped on a curb falling and injuring her left knee. She had difficulty standing and ambulating and to call several times Before neighbors found her and she was transported was then [facility] room for evaluation. X-rays here demonstrate a left lateral tibial plateau fracture split and depressed. Fibula is not fractured.  Past Medical History  Diagnosis Date  . Hypertension   . Hyperthyroidism   . Diverticulosis   . Anemia   . Gout     Past Surgical History  Procedure Date  . Cataract extraction w/ intraocular lens  implant, bilateral 2006  . Colonoscopy     Family History  Problem Relation Age of Onset  . Diabetes Mother   . Heart attack Sister   . Cancer Sister     colon  . Colon cancer Sister   . Heart attack Brother   . Pancreatic cancer Brother   . Esophageal cancer Neg Hx   . Stomach cancer Neg Hx   . Rectal cancer Neg Hx    Social History:  reports that she has never smoked. She has never used smokeless tobacco. She reports that she does not drink alcohol or use illicit drugs.  Allergies: No Known Allergies   (Not in a hospital admission)  No results found for this or any previous visit (from the past 48 hour(s)). Dg Knee Complete 4 Views Left  10/23/2012  *RADIOLOGY REPORT*  Clinical Data: Fall with injury to left knee.  LEFT KNEE - COMPLETE 4+ VIEW  Comparison: None.  Findings: There is evidence of a comminuted and depressed fracture of the lateral tibial plateau.  Fracture planes extend into the tibial metaphysis and towards the central aspect of the tibia with evidence of probable fracture extension to the base of the tibial spines.  Small potential avulsion fracture is also seen adjacent to the lateral femoral condyle.  There is associated joint effusion. Significant proliferative changes are seen involving the patella.   IMPRESSION: Comminuted and depressed lateral tibial plateau fracture. Potential avulsion is also seen adjacent to the lateral femoral condyle.   Original Report Authenticated By: Irish Lack, M.D.     Review of Systems  Constitutional: Negative for fever, chills, weight loss, malaise/fatigue and diaphoresis.  HENT: Negative for hearing loss, ear pain, nosebleeds, congestion, sore throat, neck pain, tinnitus and ear discharge.   Eyes: Negative for blurred vision, double vision, photophobia, pain, discharge and redness.  Respiratory: Negative for cough, hemoptysis, sputum production, shortness of breath, wheezing and stridor.   Cardiovascular: Negative for chest pain, palpitations, orthopnea, claudication, leg swelling and PND.  Gastrointestinal: Negative for heartburn, nausea, vomiting, abdominal pain, diarrhea, constipation, blood in stool and melena.  Genitourinary: Negative for dysuria, urgency, frequency, hematuria and flank pain.  Musculoskeletal: Positive for joint pain and falls. Negative for myalgias and back pain.  Skin: Negative for itching and rash.  Neurological: Negative for dizziness, tingling, tremors, sensory change, speech change, focal weakness, seizures, loss of consciousness, weakness and headaches.  Endo/Heme/Allergies: Negative for environmental allergies and polydipsia. Does not bruise/bleed easily.  Psychiatric/Behavioral: Negative for depression, suicidal ideas, hallucinations, memory loss and substance abuse. The patient is not nervous/anxious and does not have insomnia.     Blood pressure 161/73, pulse 72, temperature 97.5 F (36.4 C), temperature source Oral, resp. rate 16, SpO2 100.00%. Physical Exam  Constitutional: She is oriented to person,  place, and time. She appears well-developed and well-nourished. No distress.  HENT:  Head: Normocephalic and atraumatic.  Right Ear: External ear normal.  Left Ear: External ear normal.  Nose: Nose normal.    Mouth/Throat: Oropharynx is clear and moist. No oropharyngeal exudate.  Eyes: Conjunctivae normal and EOM are normal. Pupils are equal, round, and reactive to light. Right eye exhibits no discharge. Left eye exhibits no discharge. No scleral icterus.  Neck: Normal range of motion. Neck supple. No JVD present. No tracheal deviation present. No thyromegaly present.  Cardiovascular: Normal rate, regular rhythm and intact distal pulses.  Exam reveals friction rub. Exam reveals no gallop.   No murmur heard. Respiratory: Effort normal and breath sounds normal. No stridor. No respiratory distress. She has no wheezes. She has no rales. She exhibits no tenderness.  GI: Soft. Bowel sounds are normal. She exhibits no distension and no mass. There is no tenderness. There is no rebound and no guarding.  Musculoskeletal: She exhibits edema and tenderness.  Lymphadenopathy:    She has no cervical adenopathy.  Neurological: She is alert and oriented to person, place, and time. She has normal reflexes. She displays normal reflexes. No cranial nerve deficit. She exhibits abnormal muscle tone. Coordination normal.  Skin: Skin is warm and dry. No rash noted. She is not diaphoretic. No erythema. No pallor.  Psychiatric: She has a normal mood and affect. Her behavior is normal. Judgment and thought content normal.  Orthopedic evaluation: Patient is a 77 year old female alert oriented x4, pleasant petite. She is in no acute distress. Knee immobilizer left knee. She has swelling about the left knee. Left leg is neurovascularly normal is noted creating crepitus with range of motion of left knee. Pulses left foot are normal. Left leg compartments are soft. Plain radiographs demonstrated depressed left lateral tibial plateau fracture with split and widening of the proximal tibia flare.  Assessment/Plan Left lateral tibial plateau fracture split depressed with widening of the proximal tibial flare depression greater than 15  mm. Hypertension by history patient is taking the event and Norvasc.  Plan: The patient will be admitted for pain control ice and elevation. OR has been contacted and she is scheduled for ORIF of the left lateral tibial plateau fracture 10 AM 10/24/2012. Patient understands the risks and benefits of procedure and understands the risks of infection bleeding neurologic compromise.  Vasili Fok E 10/23/2012, 11:51 PM

## 2012-10-24 ENCOUNTER — Encounter (HOSPITAL_COMMUNITY): Payer: Self-pay | Admitting: Anesthesiology

## 2012-10-24 ENCOUNTER — Inpatient Hospital Stay (HOSPITAL_COMMUNITY): Payer: Medicare Other

## 2012-10-24 ENCOUNTER — Encounter (HOSPITAL_COMMUNITY)
Admission: EM | Disposition: A | Discharge status: Skilled Nursing Facility | Payer: Self-pay | Source: Home / Self Care | Attending: Specialist

## 2012-10-24 ENCOUNTER — Inpatient Hospital Stay (HOSPITAL_COMMUNITY): Payer: Medicare Other | Admitting: Anesthesiology

## 2012-10-24 HISTORY — PX: ORIF TIBIA PLATEAU: SHX2132

## 2012-10-24 LAB — URINALYSIS, ROUTINE W REFLEX MICROSCOPIC
Bilirubin Urine: NEGATIVE
Hgb urine dipstick: NEGATIVE
Protein, ur: NEGATIVE mg/dL
Urobilinogen, UA: 0.2 mg/dL (ref 0.0–1.0)

## 2012-10-24 LAB — CBC
HCT: 28.7 % — ABNORMAL LOW (ref 36.0–46.0)
MCHC: 33.8 g/dL (ref 30.0–36.0)
MCV: 84.9 fL (ref 78.0–100.0)
RDW: 15.4 % (ref 11.5–15.5)

## 2012-10-24 LAB — COMPREHENSIVE METABOLIC PANEL
BUN: 25 mg/dL — ABNORMAL HIGH (ref 6–23)
Calcium: 9.5 mg/dL (ref 8.4–10.5)
Creatinine, Ser: 1.7 mg/dL — ABNORMAL HIGH (ref 0.50–1.10)
GFR calc Af Amer: 33 mL/min — ABNORMAL LOW (ref >90)
Glucose, Bld: 170 mg/dL — ABNORMAL HIGH (ref 70–99)
Total Protein: 7.9 g/dL (ref 6.0–8.3)

## 2012-10-24 SURGERY — OPEN REDUCTION INTERNAL FIXATION (ORIF) TIBIAL PLATEAU
Anesthesia: General | Site: Leg Lower | Laterality: Left | Wound class: Clean

## 2012-10-24 MED ORDER — BISACODYL 5 MG PO TBEC: 5.0000 mg | DELAYED_RELEASE_TABLET | Freq: Every day | ORAL | Status: DC | PRN

## 2012-10-24 MED ORDER — KCL IN DEXTROSE-NACL 20-5-0.45 MEQ/L-%-% IV SOLN
INTRAVENOUS | Status: DC
  Administered 2012-10-24 (×2): via INTRAVENOUS
  Filled 2012-10-24 (×2): qty 1000

## 2012-10-24 MED ORDER — CHLORHEXIDINE GLUCONATE 4 % EX LIQD
60.0000 mL | Freq: Once | CUTANEOUS | Status: AC
  Administered 2012-10-24: 4 via TOPICAL
  Filled 2012-10-24: qty 60

## 2012-10-24 MED ORDER — METHOCARBAMOL 500 MG PO TABS
500.0000 mg | ORAL_TABLET | Freq: Four times a day (QID) | ORAL | Status: DC | PRN
  Administered 2012-10-25 – 2012-10-27 (×3): 500 mg via ORAL
  Filled 2012-10-24 (×3): qty 1

## 2012-10-24 MED ORDER — 0.9 % SODIUM CHLORIDE (POUR BTL) OPTIME
TOPICAL | Status: DC | PRN
  Administered 2012-10-24: 1000 mL

## 2012-10-24 MED ORDER — FLEET ENEMA 7-19 GM/118ML RE ENEM: 1.0000 | ENEMA | Freq: Once | RECTAL | Status: AC | PRN

## 2012-10-24 MED ORDER — ONDANSETRON HCL 4 MG/2ML IJ SOLN: 4.0000 mg | Freq: Four times a day (QID) | INTRAMUSCULAR | Status: DC | PRN

## 2012-10-24 MED ORDER — CEFAZOLIN SODIUM-DEXTROSE 2-3 GM-% IV SOLR
INTRAVENOUS | Status: AC
  Filled 2012-10-24: qty 50

## 2012-10-24 MED ORDER — HYDROCODONE-ACETAMINOPHEN 5-325 MG PO TABS
1.0000 | ORAL_TABLET | ORAL | Status: DC | PRN
  Administered 2012-10-25: 1 via ORAL
  Administered 2012-10-25 – 2012-10-26 (×4): 2 via ORAL
  Administered 2012-10-26: 1 via ORAL
  Administered 2012-10-26: 2 via ORAL
  Administered 2012-10-27: 1 via ORAL
  Filled 2012-10-24 (×2): qty 1
  Filled 2012-10-24 (×2): qty 2
  Filled 2012-10-24: qty 1
  Filled 2012-10-24 (×3): qty 2

## 2012-10-24 MED ORDER — CEFAZOLIN SODIUM 1-5 GM-% IV SOLN
1.0000 g | Freq: Four times a day (QID) | INTRAVENOUS | Status: AC
  Administered 2012-10-24 – 2012-10-25 (×3): 1 g via INTRAVENOUS
  Filled 2012-10-24 (×3): qty 50

## 2012-10-24 MED ORDER — MORPHINE SULFATE 2 MG/ML IJ SOLN
0.5000 mg | INTRAMUSCULAR | Status: DC | PRN
  Administered 2012-10-24 (×2): 0.5 mg via INTRAVENOUS
  Filled 2012-10-24 (×2): qty 1

## 2012-10-24 MED ORDER — PROMETHAZINE HCL 25 MG/ML IJ SOLN: 6.2500 mg | INTRAMUSCULAR | Status: DC | PRN

## 2012-10-24 MED ORDER — DIPHENHYDRAMINE HCL 12.5 MG/5ML PO ELIX: 12.5000 mg | ORAL_SOLUTION | ORAL | Status: DC | PRN

## 2012-10-24 MED ORDER — POLYETHYLENE GLYCOL 3350 17 G PO PACK: 17.0000 g | PACK | Freq: Every day | ORAL | Status: DC | PRN

## 2012-10-24 MED ORDER — LEVOTHYROXINE SODIUM 75 MCG PO TABS
75.0000 ug | ORAL_TABLET | Freq: Every day | ORAL | Status: DC
  Administered 2012-10-24 – 2012-10-28 (×5): 75 ug via ORAL
  Filled 2012-10-24 (×6): qty 1

## 2012-10-24 MED ORDER — KCL IN DEXTROSE-NACL 20-5-0.45 MEQ/L-%-% IV SOLN
INTRAVENOUS | Status: AC
  Filled 2012-10-24: qty 1000

## 2012-10-24 MED ORDER — ACETAMINOPHEN 10 MG/ML IV SOLN
INTRAVENOUS | Status: AC
  Filled 2012-10-24: qty 100

## 2012-10-24 MED ORDER — HYDROMORPHONE HCL PF 1 MG/ML IJ SOLN
INTRAMUSCULAR | Status: DC | PRN
  Administered 2012-10-24 (×4): 0.5 mg via INTRAVENOUS

## 2012-10-24 MED ORDER — OXYCODONE HCL 5 MG PO TABS
5.0000 mg | ORAL_TABLET | ORAL | Status: DC | PRN
  Administered 2012-10-25: 5 mg via ORAL
  Filled 2012-10-24: qty 1

## 2012-10-24 MED ORDER — AMLODIPINE BESYLATE 10 MG PO TABS
10.0000 mg | ORAL_TABLET | Freq: Every day | ORAL | Status: DC
  Administered 2012-10-24 – 2012-10-28 (×5): 10 mg via ORAL
  Filled 2012-10-24 (×5): qty 1

## 2012-10-24 MED ORDER — ACETAMINOPHEN 10 MG/ML IV SOLN
INTRAVENOUS | Status: DC | PRN
  Administered 2012-10-24: 1000 mg via INTRAVENOUS

## 2012-10-24 MED ORDER — HYDROCODONE-ACETAMINOPHEN 5-325 MG PO TABS: 1.0000 | ORAL_TABLET | Freq: Four times a day (QID) | ORAL | Status: DC | PRN

## 2012-10-24 MED ORDER — METOCLOPRAMIDE HCL 10 MG PO TABS: 5.0000 mg | ORAL_TABLET | Freq: Three times a day (TID) | ORAL | Status: DC | PRN

## 2012-10-24 MED ORDER — ONDANSETRON HCL 4 MG PO TABS: 4.0000 mg | ORAL_TABLET | Freq: Four times a day (QID) | ORAL | Status: DC | PRN

## 2012-10-24 MED ORDER — METHOCARBAMOL 100 MG/ML IJ SOLN
500.0000 mg | Freq: Four times a day (QID) | INTRAVENOUS | Status: DC | PRN
  Filled 2012-10-24: qty 5

## 2012-10-24 MED ORDER — DOCUSATE SODIUM 100 MG PO CAPS
100.0000 mg | ORAL_CAPSULE | Freq: Two times a day (BID) | ORAL | Status: DC
  Administered 2012-10-24 – 2012-10-28 (×8): 100 mg via ORAL

## 2012-10-24 MED ORDER — METOCLOPRAMIDE HCL 5 MG/ML IJ SOLN: 5.0000 mg | Freq: Three times a day (TID) | INTRAMUSCULAR | Status: DC | PRN

## 2012-10-24 MED ORDER — ONDANSETRON HCL 4 MG/2ML IJ SOLN
INTRAMUSCULAR | Status: DC | PRN
  Administered 2012-10-24: 4 mg via INTRAVENOUS

## 2012-10-24 MED ORDER — IRBESARTAN 150 MG PO TABS
150.0000 mg | ORAL_TABLET | Freq: Every day | ORAL | Status: DC
  Administered 2012-10-24 – 2012-10-28 (×5): 150 mg via ORAL
  Filled 2012-10-24 (×5): qty 1

## 2012-10-24 MED ORDER — HYDROMORPHONE HCL PF 1 MG/ML IJ SOLN: 0.2500 mg | INTRAMUSCULAR | Status: DC | PRN

## 2012-10-24 MED ORDER — LACTATED RINGERS IV SOLN: INTRAVENOUS | Status: DC

## 2012-10-24 MED ORDER — ENOXAPARIN SODIUM 30 MG/0.3ML ~~LOC~~ SOLN
30.0000 mg | Freq: Two times a day (BID) | SUBCUTANEOUS | Status: DC
  Administered 2012-10-25 – 2012-10-28 (×7): 30 mg via SUBCUTANEOUS
  Filled 2012-10-24 (×9): qty 0.3

## 2012-10-24 MED ORDER — GLYCOPYRROLATE 0.2 MG/ML IJ SOLN
INTRAMUSCULAR | Status: DC | PRN
  Administered 2012-10-24: .6 mg via INTRAVENOUS

## 2012-10-24 MED ORDER — NEOSTIGMINE METHYLSULFATE 1 MG/ML IJ SOLN
INTRAMUSCULAR | Status: DC | PRN
  Administered 2012-10-24: 4 mg via INTRAVENOUS

## 2012-10-24 MED ORDER — CEFAZOLIN SODIUM-DEXTROSE 2-3 GM-% IV SOLR
2.0000 g | INTRAVENOUS | Status: AC
  Administered 2012-10-24: 2 g via INTRAVENOUS

## 2012-10-24 MED ORDER — FENTANYL CITRATE 0.05 MG/ML IJ SOLN
INTRAMUSCULAR | Status: DC | PRN
  Administered 2012-10-24: 100 ug via INTRAVENOUS

## 2012-10-24 MED ORDER — ROCURONIUM BROMIDE 100 MG/10ML IV SOLN
INTRAVENOUS | Status: DC | PRN
  Administered 2012-10-24: 40 mg via INTRAVENOUS

## 2012-10-24 MED ORDER — MORPHINE SULFATE 2 MG/ML IJ SOLN
1.0000 mg | INTRAMUSCULAR | Status: DC | PRN
  Administered 2012-10-24 – 2012-10-25 (×3): 1 mg via INTRAVENOUS
  Filled 2012-10-24 (×2): qty 1

## 2012-10-24 MED ORDER — PROPOFOL 10 MG/ML IV BOLUS
INTRAVENOUS | Status: DC | PRN
  Administered 2012-10-24: 160 mg via INTRAVENOUS

## 2012-10-24 MED ORDER — LIDOCAINE HCL (CARDIAC) 20 MG/ML IV SOLN
INTRAVENOUS | Status: DC | PRN
  Administered 2012-10-24: 50 mg via INTRAVENOUS

## 2012-10-24 MED ORDER — LACTATED RINGERS IV SOLN
INTRAVENOUS | Status: DC | PRN
  Administered 2012-10-24 (×2): via INTRAVENOUS

## 2012-10-24 SURGICAL SUPPLY — 58 items
BANDAGE ELASTIC 4 VELCRO ST LF (GAUZE/BANDAGES/DRESSINGS) ×1 IMPLANT
BANDAGE ELASTIC 6 VELCRO ST LF (GAUZE/BANDAGES/DRESSINGS) ×1 IMPLANT
BIT DRILL 2.5X110 QC LCP DISP (BIT) ×1 IMPLANT
BIT DRILL CALI LONG 2.8MM (BIT) IMPLANT
BLADE SAW SAG 73X25 THK (BLADE) ×1
BLADE SAW SGTL 73X25 THK (BLADE) IMPLANT
CLOTH BEACON ORANGE TIMEOUT ST (SAFETY) ×2 IMPLANT
CUFF TOURN SGL QUICK 34 (TOURNIQUET CUFF) ×2
CUFF TRNQT CYL 34X4X40X1 (TOURNIQUET CUFF) IMPLANT
DRAPE C-ARMOR (DRAPES) ×1 IMPLANT
DRAPE INCISE IOBAN 66X45 STRL (DRAPES) ×1 IMPLANT
DRAPE U-SHAPE 47X51 STRL (DRAPES) ×1 IMPLANT
DRILL BIT ×1 IMPLANT
DRILL BIT CALI LONG 2.8MM (BIT) ×2
DRSG ADAPTIC 3X8 NADH LF (GAUZE/BANDAGES/DRESSINGS) ×1 IMPLANT
DRSG PAD ABDOMINAL 8X10 ST (GAUZE/BANDAGES/DRESSINGS) ×1 IMPLANT
ELECT REM PT RETURN 9FT ADLT (ELECTROSURGICAL) ×2
ELECTRODE REM PT RTRN 9FT ADLT (ELECTROSURGICAL) IMPLANT
GLOVE BIOGEL PI IND STRL 7.5 (GLOVE) IMPLANT
GLOVE BIOGEL PI INDICATOR 7.5 (GLOVE) ×2
GLOVE ECLIPSE 8.5 STRL (GLOVE) ×4 IMPLANT
GOWN ISOLATION IMPERV (GOWNS) ×1 IMPLANT
GOWN STRL REIN XL XLG (GOWN DISPOSABLE) ×4 IMPLANT
GRAFT BNE STRUT 20X200 CRT FEM (Bone Implant) IMPLANT
GRAFT DBM PARTICULATE CANC 5 (Bone Implant) IMPLANT
GRAFT IC CHAMBER 5CC (Bone Implant) ×2 IMPLANT
GUIDE WIRE ×1 IMPLANT
IMMOBILIZER KNEE 20 (SOFTGOODS) ×2
IMMOBILIZER KNEE 20 THIGH 36 (SOFTGOODS) IMPLANT
MANIFOLD NEPTUNE II (INSTRUMENTS) ×2 IMPLANT
PACK LOWER EXTREMITY WL (CUSTOM PROCEDURE TRAY) ×1 IMPLANT
PADDING CAST COTTON 6X4 STRL (CAST SUPPLIES) ×1 IMPLANT
PLATE TIBIA LCP 4H LEFT 81MM (Plate) ×1 IMPLANT
POSITIONER SURGICAL ARM (MISCELLANEOUS) ×2 IMPLANT
SCREW CORTEX 3.5 32MM (Screw) ×1 IMPLANT
SCREW CORTEX 3.5 45MM (Screw) ×1 IMPLANT
SCREW CORTEX 3.5 50MM (Screw) ×1 IMPLANT
SCREW CORTEX 3.5 60MM (Screw) ×1 IMPLANT
SCREW CORTEX 3.5X75MM (Screw) ×1 IMPLANT
SCREW CORTEX 3.5X80MM (Screw) ×1 IMPLANT
SCREW LOCK CORT ST 3.5X32 (Screw) IMPLANT
SCREW LOCKING SLF TAP 3.5X70MM (Screw) ×1 IMPLANT
SCREW LOCKING SLF TAP 3.5X75MM (Screw) ×1 IMPLANT
SCREW SELF TAP 65MM (Screw) ×2 IMPLANT
SET PAD KNEE POSITIONER (MISCELLANEOUS) ×1 IMPLANT
SPONGE GAUZE 4X4 12PLY (GAUZE/BANDAGES/DRESSINGS) ×1 IMPLANT
SPONGE LAP 18X18 X RAY DECT (DISPOSABLE) ×1 IMPLANT
STAPLER VISISTAT 35W (STAPLE) ×1 IMPLANT
SUCTION FRAZIER 12FR DISP (SUCTIONS) ×1 IMPLANT
SUT ETHIBOND NAB CT1 #1 30IN (SUTURE) ×2 IMPLANT
SUT VIC AB 0 CT1 36 (SUTURE) ×2 IMPLANT
SUT VIC AB 1 CT1 27 (SUTURE) ×2
SUT VIC AB 1 CT1 27XBRD ANTBC (SUTURE) IMPLANT
SUT VIC AB 2-0 CT1 27 (SUTURE) ×4
SUT VIC AB 2-0 CT1 TAPERPNT 27 (SUTURE) IMPLANT
TAPE STRIPS DRAPE STRL (GAUZE/BANDAGES/DRESSINGS) ×1 IMPLANT
TISSUE GRFT STRUT 20X200 STRL (Bone Implant) ×2 IMPLANT
TOWEL OR 17X26 10 PK STRL BLUE (TOWEL DISPOSABLE) ×4 IMPLANT

## 2012-10-24 NOTE — Op Note (Addendum)
10/23/2012 - 10/24/2012  4:45 PM  PATIENT:  [name]  77 y.o. female  MRN: [id number]  PRE-OPERATIVE DIAGNOSIS: tibial plateau fracture   POST-OPERATIVE DIAGNOSIS: tibial plateau fracture  PROCEDURE: Procedure(s) (LRB) with comments:  OPEN REDUCTION INTERNAL FIXATION (ORIF) TIBIAL PLATEAU (Left)Synthes lateral tibial plateau locking plate 8 hole  4.0JW with 3 locking proximal screws, fresh frozen Femoral cortical strut grafts and cancellous bone graft chips 5 cc   SURGEON: Surgeon(s) and Role  Kerrin Champagne, MD     ANESTHESIA:  General,Dr. Karin Golden Fortune.    COMPLICATIONS:  None.   EBL: 100cc  TOURNIQUET:  Total Tourniquet Time Documented:  Thigh (Left) - 75 minutes    COMPONENTS: 8 hole Synthes lateral tibial plateau locking plate, 3 proximal locking screws. Allograft bone graft:  Fresh frozen femoral cortical  Strut and 5 cc freeze dried cancellous chips.  PROCEDURE: The patient was met in the holding area, and the appropriate left leg identified and marked with an "X" and my initials. The patient was then transported to OR and was placed on the operative table in a supine position. The patient was then placed under  general anesthesia without difficulty. The patient received appropriate preoperative antibiotic prophylaxis 2 g Ancef.   Tourniquet was applied to the operative  thigh.  Leg was then prepped using sterile conditions and draped using sterile technique.  Time-out procedure was called and correct.The left leg was elevated and Esmarch exsanguinated with a thigh   tourniquet elevated to 320 mmHg. incision made with 10 blade scalpel left para patella tendon incision extending distal to the anterior tibial tubercle on the lateral crest of the tibia approximately 3 cm and then curved posteriorly along the area just superior to the joint line. Incision through skin and subcutaneous layers and through the tensor fascia. The lateral compartment carefully elevated off the  lateral aspect the proximal tibia and off of a bone fragment over the anterior lateral aspect of the left lateral tibial plateau. The split fracture easily identified along the anterior lateral aspect of the left proximal tibia. This was then carefully freed up using a Cobb elevator and rotated laterally exposing the proximal tibia metaphyseal and epiphyseal cancellus areas. Portions of subchondral bone and cartilage were identified depressed within the window to the metaphysis. Irrigation carried out bone fragments carefully  left in place and a curved three-quarter inch osteotome then reduced into the metaphysis distal to the depressed fragments this was then used to lever the fragments proximally. Fragments are were carefully reduced using the osteotome and pressing directly underneath the fragments from distal to proximal lifting the joint line. The lateral cortex fragment was then able to be rotated into place and C-arm images demonstrated reduction within a few millimeters. Bone grafting was felt to be necessary. Femoral fresh frozen strut graft was obtained and this was thawed using warm normal saline. Additional 5 cc of cancellus chips was obtained and soaked in saline. Fresh frozen strut graft was then carefully cut into transverse cuts and the appropriate height so as to strut the lateral tibial plateau with 2 large grafts placed into the lateral metaphyseal area of the tibia and impacted into place elevating lateral plateau. Additional cancellus chips were then placed into the proximal canal filling in the voids of bone present. The lateral hinge anterior lateral fragment was then rotated into place and pinned into place using K wire. C-arm fluoroscopy ascertain good reduction of the joint line with this. An 8 hole lateral tibial  plateau locking plate and carefully approximated to close to the joint line laterally as possible and K wires then used to pin the plate anterior lateral on the proximal fragment  and aligned along the anterior lateral aspect of the proximal tibial flare. Once this was positioned correctly an additional pin was placed to further align this. For screw placed this in the second from the anterior proximal hole drilling with a 2.5 drill transversely observing on C-arm that this remained below the knee joint surface and this was measured for length and an 80 mm length was necessary and a 3.5 cortical screw was then passed obtaining initially lagging of the lateral 2 medial tibial fragments. Then the anteriormost locking screw was placed proximally using the sleeve drilling with a 2.8 drill and placing the appropriate size lag screw realizing that the screws do not need to engage the medial cortex. Additional to posterior lag screws were placed in the proximal leg. No oblique lag screw was then placed in the position just below the most proximal screw holes and this obtained excellent purchase as well. Remaining screw holes were then filled using cortical 3.5 screws obtaining excellent purchase and buttressing the left lateral tibial plateau. Following this permanent C-arm images were obtained documenting the reduction good position alignment. Observation of the joint line through window beneath the lateral meniscus determine the joint surface to be within 1 or 2 mm of reduction. Irrigation was carried out within the joint line. The meniscocapsular junction and then sewn down about plate using a #1 Ethibond suture. Following irrigation and then the patient's tensor fascia was reapproximated with interrupted 0 Ethibond sutures and the superficial fascia to the anterior attachment of the lateral compartment of the leg was then reapproximated also with Ethibond sutures and 0 Vicryl sutures. Subcutaneous layers approximated with interrupted 0 and 2-0 Vicryl sutures and the skin closed with stainless steel staples. All instrument and sponge counts were correct. Dressing then applied with Adaptic 4 x 4's  ABDs pads fixed to the skin with sterile labrum. Ace wrap applied from the left foot to the left upper thigh. Ice pad then applied and knee immobilizer. Tourniquet was released prior to dressing. Total tourniquet time was75 minutes. The patient then reactivated extubated and returned to the recovery room in satisfactory condition.    NITKA,JAMES E  10/24/2012, 4:45 PM

## 2012-10-24 NOTE — Anesthesia Preprocedure Evaluation (Addendum)
Anesthesia Evaluation  Patient identified by MRN, date of birth, ID band Patient awake    Reviewed: Allergy & Precautions, H&P , NPO status , Patient's Chart, lab work & pertinent test results  Airway Mallampati: II TM Distance: >3 FB Neck ROM: Full    Dental  (+) Edentulous Upper and Edentulous Lower   Pulmonary neg pulmonary ROS,  breath sounds clear to auscultation  Pulmonary exam normal       Cardiovascular hypertension, Pt. on medications Rhythm:Regular Rate:Normal     Neuro/Psych negative neurological ROS  negative psych ROS   GI/Hepatic negative GI ROS, Neg liver ROS,   Endo/Other  Hyperthyroidism   Renal/GU Renal InsufficiencyRenal disease  negative genitourinary   Musculoskeletal negative musculoskeletal ROS (+)   Abdominal   Peds  Hematology negative hematology ROS (+)   Anesthesia Other Findings   Reproductive/Obstetrics negative OB ROS                          Anesthesia Physical Anesthesia Plan  ASA: II and emergent  Anesthesia Plan: General   Post-op Pain Management:    Induction: Intravenous  Airway Management Planned: Oral ETT  Additional Equipment:   Intra-op Plan:   Post-operative Plan: Extubation in OR  Informed Consent: I have reviewed the patients History and Physical, chart, labs and discussed the procedure including the risks, benefits and alternatives for the proposed anesthesia with the patient or authorized representative who has indicated his/her understanding and acceptance.   Dental advisory given  Plan Discussed with: CRNA  Anesthesia Plan Comments:        Anesthesia Quick Evaluation

## 2012-10-24 NOTE — Transfer of Care (Signed)
Immediate Anesthesia Transfer of Care Note  Patient: [name]  Procedure(s) Performed: Procedure(s) (LRB) with comments: OPEN REDUCTION INTERNAL FIXATION (ORIF) TIBIAL PLATEAU (Left)  Patient Location: PACU  Anesthesia Type:General  Level of Consciousness: awake, alert  and oriented  Airway & Oxygen Therapy: Patient Spontanous Breathing and Patient connected to face mask oxygen  Post-op Assessment: Report given to PACU RN and Post -op Vital signs reviewed and stable  Post vital signs: Reviewed and stable  Complications: No apparent anesthesia complications

## 2012-10-24 NOTE — Care Management Note (Unsigned)
    Page 1 of 2   10/24/2012     3:09:17 PM   CARE MANAGEMENT NOTE 10/24/2012  Patient:  [name], [name]   Account Number:  [id number]  Date Initiated:  10/24/2012  Documentation initiated by:  Letha Cape  Subjective/Objective Assessment:   dx tibial plateu fx s/p ORIF  admit- lives with her sister.     Action/Plan:   Anticipated DC Date:  10/27/2012   Anticipated DC Plan:  HOME W HOME HEALTH SERVICES      DC Planning Services  CM consult      [facility] Choice  HOME HEALTH   Choice offered to / List presented to:  C-4 Adult Children      DME agency  [facility]     HH arranged  HH-2 PT  HH-3 OT  HH-1 RN      [facility] agency  [facility]   Status of service:  In process, will continue to follow Medicare Important Message given?   (If response is "NO", the following Medicare IM given date fields will be blank) Date Medicare IM given:   Date Additional Medicare IM given:    Discharge Disposition:    Per UR Regulation:  Reviewed for med. necessity/level of care/duration of stay  If discussed at [facility] Length of Stay Meetings, dates discussed:    Comments:  10/24/12 15:02 Letha Cape RN, BSN [phone] patient lives with sister.  Patient is s/p ORIF today, not sure when she will be ready for dc depends on progression. Spoke with patient's daughter, she chose [facility] from agency list, for HHPT/OT,patient will prob need a HHRN as well, rolling walker and 3 n1.  Referral faxed to [facility].  [facility] representative also knows about the DME.  Patient's daughter states that she bought a rolling walker but she will take it back because she did not know patient could get this throuh her insurance.  NCM will continue to follow for dc planning.

## 2012-10-24 NOTE — Anesthesia Postprocedure Evaluation (Signed)
Anesthesia Post Note  Patient: [name]  Procedure(s) Performed: Procedure(s) (LRB): OPEN REDUCTION INTERNAL FIXATION (ORIF) TIBIAL PLATEAU (Left)  Anesthesia type: General  Patient location: PACU  Post pain: Pain level controlled  Post assessment: Post-op Vital signs reviewed  Last Vitals:  Filed Vitals:   10/24/12 1411  BP: 146/71  Pulse: 64  Temp: 36.2 C  Resp: 13    Post vital signs: Reviewed  Level of consciousness: sedated  Complications: No apparent anesthesia complications

## 2012-10-24 NOTE — ED Provider Notes (Signed)
History     CSN: [id number]  Arrival date & time 10/23/12  2058   First MD Initiated Contact with Patient 10/23/12 2120      Chief Complaint  Patient presents with  . Knee Pain    (Consider location/radiation/quality/duration/timing/severity/associated sxs/prior treatment) Patient is a 77 y.o. female presenting with fall. The history is provided by the patient.  Fall The accident occurred less than 1 hour ago. The fall occurred while walking. She fell from a height of 3 to 5 ft. She landed on concrete. There was no blood loss. The point of impact was the left knee. The pain is present in the left knee. The pain is at a severity of 3/10. The pain is mild. She was not ambulatory at the scene. There was no entrapment after the fall. There was no drug use involved in the accident. There was no alcohol use involved in the accident. Pertinent negatives include no visual change, no fever, no numbness, no abdominal pain, no bowel incontinence, no nausea, no vomiting, no hematuria, no headaches, no hearing loss, no loss of consciousness and no tingling. The symptoms are aggravated by extension, flexion, standing, pressure on the injury, use of the injured limb and activity. She has tried nothing for the symptoms.    Past Medical History  Diagnosis Date  . Hypertension   . Hyperthyroidism   . Diverticulosis   . Anemia   . Gout     Past Surgical History  Procedure Date  . Cataract extraction w/ intraocular lens  implant, bilateral 2006  . Colonoscopy     Family History  Problem Relation Age of Onset  . Diabetes Mother   . Heart attack Sister   . Cancer Sister     colon  . Colon cancer Sister   . Heart attack Brother   . Pancreatic cancer Brother   . Esophageal cancer Neg Hx   . Stomach cancer Neg Hx   . Rectal cancer Neg Hx     History  Substance Use Topics  . Smoking status: Never Smoker   . Smokeless tobacco: Never Used  . Alcohol Use: No    OB History    Grav Para  Term Preterm Abortions TAB SAB Ect Mult Living                  Review of Systems  Constitutional: Negative for fever.  Gastrointestinal: Negative for nausea, vomiting, abdominal pain and bowel incontinence.  Genitourinary: Negative for hematuria.  Musculoskeletal: Positive for joint swelling and gait problem.  Neurological: Negative for tingling, loss of consciousness, numbness and headaches.  All other systems reviewed and are negative.    Allergies  Review of patient's allergies indicates no known allergies.  Home Medications   Current Outpatient Rx  Name  Route  Sig  Dispense  Refill  . AMLODIPINE BESYLATE 10 MG PO TABS   Oral   Take 1 tablet (10 mg total) by mouth daily.   90 tablet   3   . ASPIRIN 81 MG PO TABS   Oral   Take 81 mg by mouth daily.           Marland Kitchen LEVOTHYROXINE SODIUM 75 MCG PO TABS   Oral   Take 1 tablet (75 mcg total) by mouth daily.   90 tablet   3   . VALSARTAN 160 MG PO TABS   Oral   Take 1 tablet (160 mg total) by mouth daily.   90 tablet   3  BP 153/70  Pulse 75  Temp 97.5 F (36.4 C) (Oral)  Resp 16  SpO2 97%  Physical Exam  Nursing note and vitals reviewed. Constitutional: She appears well-developed and well-nourished. No distress.  HENT:  Head: Normocephalic and atraumatic.  Eyes: Conjunctivae normal and EOM are normal.  Neck: Normal range of motion. Neck supple.  Cardiovascular:       Intact distal pulses, capillary refill < 3 seconds  Musculoskeletal:       Pain w flexion & extension of left knee. Bony ttp inferior to patella. All other extremities with normal ROM  Neurological:       Inability to weight bear. No sensory deficit  Skin: She is not diaphoretic.       Left knee effusion present. Skin intact, no tenting    ED Course  Procedures (including critical care time)  Labs Reviewed - No data to display Dg Knee Complete 4 Views Left  10/23/2012  *RADIOLOGY REPORT*  Clinical Data: Fall with injury to left  knee.  LEFT KNEE - COMPLETE 4+ VIEW  Comparison: None.  Findings: There is evidence of a comminuted and depressed fracture of the lateral tibial plateau.  Fracture planes extend into the tibial metaphysis and towards the central aspect of the tibia with evidence of probable fracture extension to the base of the tibial spines.  Small potential avulsion fracture is also seen adjacent to the lateral femoral condyle.  There is associated joint effusion. Significant proliferative changes are seen involving the patella.  IMPRESSION: Comminuted and depressed lateral tibial plateau fracture. Potential avulsion is also seen adjacent to the lateral femoral condyle.   Original Report Authenticated By: Irish Lack, M.D.      No diagnosis found.  Consult: orthopedic- will admit for surgery in AM, spoke w Dr. Otelia Sergeant who will see the pt in the ER.   MDM  77 year old female presents emergency department status post mechanical fall and left knee pain.  Imaging reviewed with comminuted and depressed lateral tibial plateau fracture.  Consult orthopedic as above.  Plan is to admit patient for surgery first thing morning. Last meal Pt seen with Dr. Effie Shy who is also aware if plan. Diagnosis and treatment discussed with pt and family who are agreeable with plan.         Jaci Carrel, [place] 10/24/12 0040

## 2012-10-24 NOTE — Brief Op Note (Signed)
10/23/2012 - 10/24/2012  12:53 PM  PATIENT:  [name]  77 y.o. female  PRE-OPERATIVE DIAGNOSIS:  tibial plateau fracture  POST-OPERATIVE DIAGNOSIS:  tibial plateau fracture  PROCEDURE:  Procedure(s) (LRB) with comments: OPEN REDUCTION INTERNAL FIXATION (ORIF) TIBIAL PLATEAU (Left)Synthes lateral tibial plateau locking plate 8 hole 8.2NF with 3 locking proximal screws, fresh frozen  Femoral cortical strut grafts and cancellous bone graft chips 5 cc  SURGEON:  Surgeon(s) and Role:  Kerrin Champagne, MD - Primary  ANESTHESIA:   general, Dr. Judith Part  EBL:  Total I/O In: 1195.8 [I.V.:1195.8] Out: 750 [Urine:650; Blood:100]  BLOOD ADMINISTERED:none  DRAINS: none   LOCAL MEDICATIONS USED:  NONE  SPECIMEN:  No Specimen  DISPOSITION OF SPECIMEN:  N/A  COUNTS:  YES  TOURNIQUET:   Total Tourniquet Time Documented: Thigh (Left) - 75 minutes  DICTATION: .Reubin Milan Dictation  PLAN OF CARE: Admit to inpatient   PATIENT DISPOSITION:  PACU - hemodynamically stable.   Delay start of Pharmacological VTE agent (>24hrs) due to surgical blood loss or risk of bleeding: no

## 2012-10-25 ENCOUNTER — Other Ambulatory Visit: Payer: Medicare Other

## 2012-10-25 LAB — COMPREHENSIVE METABOLIC PANEL
ALT: 6 U/L (ref 0–35)
AST: 17 U/L (ref 0–37)
Albumin: 2.5 g/dL — ABNORMAL LOW (ref 3.5–5.2)
CO2: 23 mEq/L (ref 19–32)
Calcium: 8.8 mg/dL (ref 8.4–10.5)
Chloride: 105 mEq/L (ref 96–112)
Creatinine, Ser: 1.54 mg/dL — ABNORMAL HIGH (ref 0.50–1.10)
Sodium: 136 mEq/L (ref 135–145)
Total Bilirubin: 0.2 mg/dL — ABNORMAL LOW (ref 0.3–1.2)

## 2012-10-25 NOTE — Progress Notes (Signed)
Utilization review completed.  

## 2012-10-25 NOTE — Evaluation (Signed)
Occupational Therapy Evaluation Patient Details Name: [name] MRN: [id number] DOB: Jan 21, 1935 Today's Date: 10/25/2012 Time: 4098-1191 OT Time Calculation (min): 19 min  OT Assessment / Plan / Recommendation Clinical Impression  Pt s/p L tibial plateau ORIF. Pt presents with some dizziness, pain, decreased functional mobility and ADL. Will benefit from skilled OT services to improve independence with self care tasks, AE education and family training for eventual return home.     OT Assessment  Patient needs continued OT Services    Follow Up Recommendations  SNF    Barriers to Discharge      Equipment Recommendations  Rolling walker with 5" wheels;3 in 1 bedside comode    Recommendations for Other Services    Frequency  Min 2X/week    Precautions / Restrictions Precautions Precautions: Fall;Knee Required Braces or Orthoses: Other Brace/Splint Other Brace/Splint: bledsoe brace Restrictions Weight Bearing Restrictions: Yes LLE Weight Bearing: Non weight bearing        ADL  Eating/Feeding: Independent Where Assessed - Eating/Feeding: Chair Grooming: Wash/dry hands;Set up Where Assessed - Grooming: Supported sitting Upper Body Bathing: Chest;Right arm;Left arm;Abdomen;Supervision/safety;Set up Where Assessed - Upper Body Bathing: Unsupported sitting Lower Body Bathing: +2 Total assistance Lower Body Bathing: Patient Percentage: 40% Where Assessed - Lower Body Bathing: Supported sit to stand Upper Body Dressing: Supervision/safety Where Assessed - Upper Body Dressing: Unsupported sitting Lower Body Dressing: +2 Total assistance (pt not currently able to let go of RW to pull up clothing in standing with NWB) Lower Body Dressing: Patient Percentage: 20% Where Assessed - Lower Body Dressing: Supported sit to stand Toilet Transfer: +2 Total assistance;Other (comment) (sit to stand from EOB; pivot transfer with min assist and RW) Toilet Transfer: Patient Percentage:  (pt  65%) Toilet Transfer Method: Sit to stand Toileting - Clothing Manipulation and Hygiene: +1 Total assistance Where Assessed - Toileting Clothing Manipulation and Hygiene: Standing Tub/Shower Transfer Method: Not assessed Equipment Used: Rolling walker ADL Comments: pt stood from EOB with +2 assist pt 65% but min assist with RW for stand pivot transfer. Chair pulled up to sit. Family present. Educated on AE options as pt not allowed to bend L knee. Discussed AE but havent practiced with it yet. Family encouraging pt to consider ST SNF as she only has elderly sister available to assist 24/7 as daugther works and can only check in and out. Pt to consider SNF.     OT Diagnosis: Generalized weakness  OT Problem List: Decreased strength;Decreased range of motion;Decreased activity tolerance;Decreased knowledge of use of DME or AE OT Treatment Interventions: Self-care/ADL training;Therapeutic activities;DME and/or AE instruction;Patient/family education   OT Goals Acute Rehab OT Goals OT Goal Formulation: With patient/family Time For Goal Achievement: 11/01/12 Potential to Achieve Goals: Good ADL Goals Pt Will Perform Grooming: with set-up;Sitting, edge of bed;Sitting, chair;Other (comment) (3 tasks with no dizziness reported) ADL Goal: Grooming - Progress: Goal set today Pt Will Perform Lower Body Dressing: with min assist;with adaptive equipment;Sit to stand from chair;Sit to stand from bed ADL Goal: Lower Body Dressing - Progress: Goal set today Pt Will Transfer to Toilet: with min assist;Ambulation;with DME;3-in-1 ADL Goal: Toilet Transfer - Progress: Goal set today Pt Will Perform Toileting - Clothing Manipulation: with min assist;Standing ADL Goal: Toileting - Clothing Manipulation - Progress: Goal set today  Visit Information  Last OT Received On: 10/25/12 Assistance Needed: +2 (if ambulating) PT/OT Co-Evaluation/Treatment: Yes    Subjective Data  Subjective: I am sleepy Patient  Stated Goal: agreeable to get up  with therapy; none stated   Prior Functioning     Home Living Lives With: Family;Other (Comment) (sister) Available Help at Discharge: Family Type of Home: House Home Access: Stairs to enter Secretary/administrator of Steps: 13 Entrance Stairs-Rails: Right;Left;Can reach both Home Layout: Two level Alternate Level Stairs-Number of Steps: 6 Alternate Level Stairs-Rails: Right Bathroom Shower/Tub: Engineer, manufacturing systems: Standard Home Adaptive Equipment: None Prior Function Level of Independence: Independent Able to Take Stairs?: Yes Driving: Yes Communication Communication: No difficulties         Vision/Perception     Cognition  Overall Cognitive Status: Appears within functional limits for tasks assessed/performed Arousal/Alertness: Awake/alert Orientation Level: Appears intact for tasks assessed Behavior During Session: [facility] for tasks performed    Extremity/Trunk Assessment Right Upper Extremity Assessment RUE ROM/Strength/Tone: [facility] for tasks assessed Left Upper Extremity Assessment LUE ROM/Strength/Tone: [facility] for tasks assessed Right Lower Extremity Assessment RLE ROM/Strength/Tone: [facility] for tasks assessed Left Lower Extremity Assessment LLE ROM/Strength/Tone: Unable to fully assess;Due to precautions;Deficits LLE ROM/Strength/Tone Deficits: decreased strength against gravity observed with bed mobility, maintained KI (ortho tech to bring bledsoe brace)     Mobility Bed Mobility Bed Mobility: Supine to Sit Supine to Sit: 3: Mod assist;HOB elevated Details for Bed Mobility Assistance: verbal cues for technique, assist for L LE and slight assist for trunk, increased time Transfers Sit to Stand: 1: +2 Total assist;From bed;With upper extremity assist Sit to Stand: Patient Percentage:  (65%) Stand to Sit: 4: Min assist;To chair/3-in-1;With upper extremity assist Details for Transfer Assistance: verbal cues for safe  technique, assist to rise and steady from lower surface (height of chairs at home), +2 for safety, RW used for stand pivot     Shoulder Instructions     Exercise     Balance     End of Session OT - End of Session Equipment Utilized During Treatment: Gait belt Activity Tolerance: Other (comment) (some dizziness reported with being up first time.) Patient left: in chair;with call bell/phone within reach;with family/visitor present  GO     Lennox Laity [phone] 10/25/2012, 12:59 PM

## 2012-10-25 NOTE — Evaluation (Signed)
Physical Therapy Evaluation Patient Details Name: CHAZLYN OLMEDO MRN: [id number] DOB: 07-Jul-1935 Today's Date: 10/25/2012 Time: 4098-1191 PT Time Calculation (min): 17 min  PT Assessment / Plan / Recommendation Clinical Impression  Pt s/p L tibial plateau ORIF.  Pt would benefit from acute PT services in order to improve independence with bed mobility, transfers and short distance ambulation to prepare for next venue.  Pt lives with 75 yr old sister and daughter states she can assist before and after work so pt may need ST-SNF prior to d/c home.  Pt states she will think about SNF so if d/c home may need ambulance transport as pt has 13 steps to enter home.    PT Assessment  Patient needs continued PT services    Follow Up Recommendations  No PT follow up (f/u PT per surgeon)    Does the patient have the potential to tolerate intense rehabilitation      Barriers to Discharge        Equipment Recommendations  Rolling walker with 5" wheels;3 in 1 bedside comode    Recommendations for Other Services     Frequency Min 5X/week    Precautions / Restrictions Precautions Precautions: Fall;Knee Required Braces or Orthoses: Other Brace/Splint Other Brace/Splint: bledsoe brace Restrictions Weight Bearing Restrictions: Yes LLE Weight Bearing: Non weight bearing   Pertinent Vitals/Pain 5/10 L leg, repositioned, premedicated      Mobility  Bed Mobility Bed Mobility: Supine to Sit Supine to Sit: 3: Mod assist;HOB elevated Details for Bed Mobility Assistance: verbal cues for technique, assist for L LE and slight assist for trunk, increased time Transfers Transfers: Stand to Sit;Sit to Stand;Stand Pivot Transfers Sit to Stand: 1: +2 Total assist;From bed;With upper extremity assist Sit to Stand: Patient Percentage:  (65%) Stand to Sit: 4: Min assist;To chair/3-in-1;With upper extremity assist Stand Pivot Transfers: 4: Min assist Details for Transfer Assistance: verbal cues for safe  technique, assist to rise and steady from lower surface (height of chairs at home), +2 for safety, RW used for stand pivot Ambulation/Gait Ambulation/Gait Assistance: Other (comment) (pt declined today, goal to get to chair)    Shoulder Instructions     Exercises     PT Diagnosis: Acute pain;Difficulty walking  PT Problem List: Decreased strength;Decreased activity tolerance;Decreased mobility;Decreased knowledge of precautions;Decreased knowledge of use of DME;Pain PT Treatment Interventions: DME instruction;Gait training;Stair training;Functional mobility training;Therapeutic activities;Therapeutic exercise;Patient/family education   PT Goals Acute Rehab PT Goals PT Goal Formulation: With patient Time For Goal Achievement: 11/01/12 Potential to Achieve Goals: Good Pt will go Supine/Side to Sit: with supervision PT Goal: Supine/Side to Sit - Progress: Goal set today Pt will go Sit to Supine/Side: with supervision PT Goal: Sit to Supine/Side - Progress: Goal set today Pt will go Sit to Stand: with supervision PT Goal: Sit to Stand - Progress: Goal set today Pt will go Stand to Sit: with supervision PT Goal: Stand to Sit - Progress: Goal set today Pt will Ambulate: 1 - 15 feet;with least restrictive assistive device;with supervision PT Goal: Ambulate - Progress: Goal set today Pt will Perform Home Exercise Program: with supervision, verbal cues required/provided PT Goal: Perform Home Exercise Program - Progress: Goal set today  Visit Information  Last PT Received On: 10/25/12 Assistance Needed: +2 (if ambulating)    Subjective Data  Subjective: I'm okay.   Prior Functioning  Home Living Lives With: Family;Other (Comment) (sister) Available Help at Discharge: Family Type of Home: House Home Access: Stairs to enter [employer]  of Steps: 13 Entrance Stairs-Rails: Right;Left;Can reach both Home Layout: Two level Alternate Level Stairs-Number of Steps: 6 Alternate  Level Stairs-Rails: Right Bathroom Shower/Tub: Engineer, manufacturing systems: Standard Home Adaptive Equipment: None Prior Function Level of Independence: Independent Able to Take Stairs?: Yes Driving: Yes Communication Communication: No difficulties    Cognition  Overall Cognitive Status: Appears within functional limits for tasks assessed/performed Arousal/Alertness: Awake/alert Orientation Level: Appears intact for tasks assessed Behavior During Session: [facility] for tasks performed    Extremity/Trunk Assessment Right Lower Extremity Assessment RLE ROM/Strength/Tone: [facility] for tasks assessed Left Lower Extremity Assessment LLE ROM/Strength/Tone: Unable to fully assess;Due to precautions;Deficits LLE ROM/Strength/Tone Deficits: decreased strength against gravity observed with bed mobility, maintained KI (ortho tech to bring bledsoe brace)   Balance    End of Session PT - End of Session Equipment Utilized During Treatment: Gait belt;Left knee immobilizer;Other (comment) (bledsoe not in room yet) Activity Tolerance: Patient limited by fatigue;Patient limited by pain Patient left: in chair;with call bell/phone within reach;with family/visitor present  GP     Kendrix Orman,KATHrine E 10/25/2012, 11:43 AM Pager: [phone]

## 2012-10-25 NOTE — Progress Notes (Signed)
Subjective: 1 Day Post-Op Procedure(s) (LRB): OPEN REDUCTION INTERNAL FIXATION (ORIF) TIBIAL PLATEAU (Left) Awake, alert O x 3. IVF still in place and foley to SD Patient reports pain as moderate.    Objective:   VITALS:  Temp:  [97.2 F (36.2 C)-98.4 F (36.9 C)] 98.4 F (36.9 C) (12/02 0500) Pulse Rate:  [63-81] 81  (12/02 0500) Resp:  [11-21] 18  (12/02 0500) BP: (113-147)/(58-74) 113/68 mmHg (12/02 0500) SpO2:  [97 %-100 %] 99 % (12/02 0500)  Neurologically intact ABD soft Neurovascular intact Sensation intact distally Intact pulses distally Dorsiflexion/Plantar flexion intact Incision: dressing C/D/I   LABS  Basename 10/24/12 0508  HGB 9.7*  WBC 9.8  PLT 340    Basename 10/25/12 0436 10/24/12 0508  NA 136 137  K 4.3 3.8  CL 105 105  CO2 23 20  BUN 17 25*  CREATININE 1.54* 1.70*  GLUCOSE 139* 170*   No results found for this basename: LABPT:2,INR:2 in the last 72 hours   Assessment/Plan: 1 Day Post-Op Procedure(s) (LRB): OPEN REDUCTION INTERNAL FIXATION (ORIF) TIBIAL PLATEAU (Left)  Advance diet Up with therapy D/C IV fluids Discharge home with home health versus SNF depending on her progress with PT.  [name] E 10/25/2012, 8:12 AM

## 2012-10-26 ENCOUNTER — Encounter (HOSPITAL_COMMUNITY): Payer: Self-pay | Admitting: Specialist

## 2012-10-26 MED ORDER — HYDROCODONE-ACETAMINOPHEN 5-325 MG PO TABS: 1.0000 | ORAL_TABLET | ORAL | Status: DC | PRN

## 2012-10-26 MED ORDER — METHOCARBAMOL 500 MG PO TABS: 500.0000 mg | ORAL_TABLET | Freq: Four times a day (QID) | ORAL | Status: DC | PRN

## 2012-10-26 MED ORDER — DSS 100 MG PO CAPS: 100.0000 mg | ORAL_CAPSULE | Freq: Two times a day (BID) | ORAL | Status: DC

## 2012-10-26 MED ORDER — ASPIRIN EC 325 MG PO TBEC: 325.0000 mg | DELAYED_RELEASE_TABLET | Freq: Two times a day (BID) | ORAL | Status: DC

## 2012-10-26 NOTE — Progress Notes (Signed)
CSW consulted for SNF placement. Pt plans to return home following hospital d/c. Pt / family are interested in [facility]. RNCM has been alerted and will assist with d/c planning needs. CSW will request insurance approval for P-TAR trans home .  Cori Razor LCSW [phone]

## 2012-10-26 NOTE — Progress Notes (Signed)
Subjective: 2 Days Post-Op Procedure(s) (LRB): OPEN REDUCTION INTERNAL FIXATION (ORIF) TIBIAL PLATEAU (Left) Awake alert and O x4. To bedside recliner yesterday. Few steps non weight bearing on the right leg Would like to go home with ambulance with day time nursing aide and [facility] for PT. Patient reports pain as moderate.    Objective:   VITALS:  Temp:  [98 F (36.7 C)-99.3 F (37.4 C)] 98.8 F (37.1 C) (12/03 0515) Pulse Rate:  [70-91] 90  (12/03 0515) Resp:  [16] 16  (12/03 0515) BP: (117-130)/(62-68) 122/64 mmHg (12/03 0515) SpO2:  [90 %-97 %] 90 % (12/03 0515)  Neurologically intact ABD soft Sensation intact distally Intact pulses distally Dorsiflexion/Plantar flexion intact Incision: scant drainage No cellulitis present dressing changed and incision without erythrema , bledsoe brace in good condition.   LABS  Basename 10/24/12 0508  HGB 9.7*  WBC 9.8  PLT 340    Basename 10/25/12 0436 10/24/12 0508  NA 136 137  K 4.3 3.8  CL 105 105  CO2 23 20  BUN 17 25*  CREATININE 1.54* 1.70*  GLUCOSE 139* 170*   No results found for this basename: LABPT:2,INR:2 in the last 72 hours   Assessment/Plan: 2 Days Post-Op Procedure(s) (LRB): OPEN REDUCTION INTERNAL FIXATION (ORIF) TIBIAL PLATEAU (Left)  Advance diet Up with therapy Plan for discharge tomorrow Discharge home with home health  [name] 10/26/2012, 9:07 AM

## 2012-10-26 NOTE — Progress Notes (Signed)
Physical Therapy Treatment Patient Details Name: [name] MRN: [id number] DOB: 10/17/1935 Today's Date: 10/26/2012 Time: 7846-9629 PT Time Calculation (min): 25 min  PT Assessment / Plan / Recommendation Comments on Treatment Session  Pt able to tolerate short distance ambulation today and also performed exercises to strengthen R LE since pt will be depending on that LE while L LE heals.  Pt would benefit from ST-SNF prior to d/c home.    Follow Up Recommendations  SNF     Does the patient have the potential to tolerate intense rehabilitation     Barriers to Discharge        Equipment Recommendations  Rolling walker with 5" wheels;3 in 1 bedside comode    Recommendations for Other Services    Frequency     Plan Discharge plan remains appropriate;Frequency remains appropriate    Precautions / Restrictions Precautions Precautions: Fall;Knee Required Braces or Orthoses: Other Brace/Splint Other Brace/Splint: bledsoe brace Restrictions Weight Bearing Restrictions: Yes LLE Weight Bearing: Non weight bearing   Pertinent Vitals/Pain No pain at rest, repositioned in recliner, ice applied, premedicated    Mobility  Bed Mobility Bed Mobility: Supine to Sit Supine to Sit: With rails;HOB elevated;4: Min assist Details for Bed Mobility Assistance: verbal cues for technique, assist for L LE, increased time Transfers Transfers: Sit to Stand;Stand to Sit Sit to Stand: 3: Mod assist;With upper extremity assist;From elevated surface;From bed Stand to Sit: 4: Min assist;With upper extremity assist;To chair/3-in-1 Details for Transfer Assistance: verbal cues for safe technique, assist to rise and control descent Ambulation/Gait Ambulation/Gait Assistance: 4: Min assist Ambulation Distance (Feet): 16 Feet Assistive device: Rolling walker Ambulation/Gait Assistance Details: verbal cues for technique, step length, does well with NWB Gait velocity: decreased    Exercises General  Exercises - Lower Extremity Ankle Circles/Pumps: AROM;Both;20 reps;Limitations Ankle Circles/Pumps Limitations: decreased ankle movement on L (mostly wiggling toes) Gluteal Sets: AROM;Both;20 reps Short Arc Quad: AROM;Right;15 reps Hip ABduction/ADduction: Right;AROM;Strengthening;15 reps Straight Leg Raises: AROM;Strengthening;Right;10 reps   PT Diagnosis:    PT Problem List:   PT Treatment Interventions:     PT Goals Acute Rehab PT Goals PT Goal: Supine/Side to Sit - Progress: Progressing toward goal PT Goal: Sit to Stand - Progress: Progressing toward goal PT Goal: Stand to Sit - Progress: Progressing toward goal PT Goal: Ambulate - Progress: Progressing toward goal PT Goal: Perform Home Exercise Program - Progress: Progressing toward goal  Visit Information  Last PT Received On: 10/26/12 Assistance Needed: +1    Subjective Data  Subjective: I just got pain medicine an hour ago.   Cognition  Overall Cognitive Status: Appears within functional limits for tasks assessed/performed    Balance     End of Session PT - End of Session Equipment Utilized During Treatment: Gait belt;Other (comment) (bledsoe) Activity Tolerance: Patient limited by fatigue Patient left: in chair;with call bell/phone within reach;with nursing in room   GP     [facility] E 10/26/2012, 10:28 AM Pager: [phone]

## 2012-10-27 NOTE — Discharge Summary (Signed)
Patient examined and H&P reviewed with Vernon PA-C.  

## 2012-10-27 NOTE — Progress Notes (Signed)
Clinical Social Work Department CLINICAL SOCIAL WORK PLACEMENT NOTE 10/27/2012  Patient:  [name], [name]  Account Number:  [id number] Admit date:  10/23/2012  Clinical Social Worker:  Cori Razor, LCSW  Date/time:  10/27/2012 01:34 PM  Clinical Social Work is seeking post-discharge placement for this patient at the following level of care:   SKILLED NURSING   (*CSW will update this form in Epic as items are completed)   10/27/2012  Patient/family provided with [facility] Department of Clinical Social Work's list of facilities offering this level of care within the geographic area requested by the patient (or if unable, by the patient's family).  10/27/2012  Patient/family informed of their freedom to choose among providers that offer the needed level of care, that participate in Medicare, Medicaid or managed care program needed by the patient, have an available bed and are willing to accept the patient.    Patient/family informed of [facility]' ownership interest in [facility], as well as of the fact that they are under no obligation to receive care at this facility.  PASARR submitted to EDS on 10/27/2012 PASARR number received from EDS on 10/27/2012  FL2 transmitted to all facilities in geographic area requested by pt/family on  10/27/2012 FL2 transmitted to all facilities within larger geographic area on   Patient informed that his/her managed care company has contracts with or will negotiate with  certain facilities, including the following:     Patient/family informed of bed offers received:  10/27/2012 Patient chooses bed at  Physician recommends and patient chooses bed at    Patient to be transferred to  on   Patient to be transferred to facility by   The following physician request were entered in Epic:   Additional Comments:  Cori Razor LCSW [phone]

## 2012-10-27 NOTE — Progress Notes (Signed)
Patient examined and lab reviewed with Vernon PA-C. 

## 2012-10-27 NOTE — Progress Notes (Signed)
Patient ID: [name], female   DOB: [birth year], 77 y.o.   MRN: [id number] Pt with considerable difficulty with activity and unable to walk today.  Pt and family agree to SNF Placement for rehab.   Discharge to home cancelled.  Will plan for discharge when bed available.

## 2012-10-27 NOTE — Progress Notes (Signed)
Occupational Therapy Treatment Patient Details Name: [name] MRN: [id number] DOB: [birth year] Today's Date: 10/27/2012 Time: 4098-1191 OT Time Calculation (min): 35 min  OT Assessment / Plan / Recommendation Comments on Treatment Session Pt limited with session due to R knee now bothering her with pain. She states it is likely from all the weightbearing to compensate for L LE. Pt also reporting feeling more fatigued than yesterday. Continue to feel pt needs SNF. Pt agreeable now.    Follow Up Recommendations  SNF    Barriers to Discharge   pt with limited family assist available.    Equipment Recommendations  Rolling walker with 5" wheels;3 in 1 bedside comode    Recommendations for Other Services    Frequency Min 2X/week   Plan Discharge plan remains appropriate    Precautions / Restrictions Precautions Precautions: Fall;Knee Required Braces or Orthoses: Other Brace/Splint Other Brace/Splint: bledsoe brace Restrictions Weight Bearing Restrictions: Yes LLE Weight Bearing: Non weight bearing        ADL  Toilet Transfer: Performed;Moderate assistance Toilet Transfer Method: Stand pivot Toilet Transfer Equipment: Bedside commode Toileting - Clothing Manipulation and Hygiene: Performed;Moderate assistance;Other (comment) (for balance to manage hygiene and clothing) Where Assessed - Toileting Clothing Manipulation and Hygiene: Sit to stand from 3-in-1 or toilet Equipment Used: Rolling walker;Sock aid;Streck-handled shoe horn;Welles-handled sponge;Reacher ADL Comments: Demonstrated all AE for pt and explained coverage if pt interested in AE versus having assist. Pt fatigued after transfer to EOB. Sat for several minutes to rest before transferring to [facility] only stand pivot. Pt very tired after this initial transfer and again had to rest before being able to stand to perform hygiene and then transfer to recliner. Pt requring mod assist due  fatigue and pt stating now R knee is sore  from weightbearing so much. Much difficulty with transfer and chair and BSC had to both be adjusted /pulled up slightly to her as she couldnt fully back up before fatiguing. Continue to recommend SNF as feel it would be unsafe for pt and sister to manage at home. Pt now agreeable. Informed nursing and case manager/social worker of recommendation.     OT Diagnosis:    OT Problem List:   OT Treatment Interventions:     OT Goals ADL Goals ADL Goal: Toilet Transfer - Progress: Not progressing (due to pain and fatigue) ADL Goal: Toileting - Clothing Manipulation - Progress: Not progressing  Visit Information  Last OT Received On: 10/27/12 Assistance Needed: +1    Subjective Data  Subjective: I do need to use the bathroom Patient Stated Goal: agreeable to get up with OT; none stated.   Prior Functioning       Cognition       Mobility  Shoulder Instructions Bed Mobility Bed Mobility: Supine to Sit Supine to Sit: HOB elevated;4: Min assist Details for Bed Mobility Assistance: with cues for technique, hand placement, assist for L LE and increased time for transfer to EOB. Transfers Transfers: Sit to Stand;Stand to Sit Sit to Stand: 3: Mod assist;With upper extremity assist;From bed;From chair/3-in-1 Stand to Sit: 3: Mod assist;With upper extremity assist;To chair/3-in-1 Details for Transfer Assistance: verbal cues for hand placement and assist to rise. Pt fatigued today and with pain now in R LE.       Exercises      Balance Balance Balance Assessed: Yes Dynamic Standing Balance Dynamic Standing - Level of Assistance: 3: Mod assist;Other (comment) (to perform hygiene with one hand on walker at all times)  End of Session OT - End of Session Equipment Utilized During Treatment: Gait belt Activity Tolerance: Patient limited by fatigue;Patient limited by pain Patient left: in chair;with call bell/phone within reach  GO     Lennox Laity [phone] 10/27/2012,  12:05 PM

## 2012-10-27 NOTE — Progress Notes (Signed)
10/27/2012 Colleen Can BSN RN CCM [phone] Plans have changed to SNF for Rehab. Referral to Clinical SW. CM signing off.

## 2012-10-27 NOTE — Progress Notes (Signed)
Subjective: 3 Days Post-Op Procedure(s) (LRB): OPEN REDUCTION INTERNAL FIXATION (ORIF) TIBIAL PLATEAU (Left) Patient reports pain as mild.  Tolerating po pain meds.  Ready for discharge and has family assistance.  Objective: Vital signs in last 24 hours: Temp:  [97.6 F (36.4 C)-98.9 F (37.2 C)] 98.9 F (37.2 C) (12/04 0533) Pulse Rate:  [76-86] 86  (12/04 0533) Resp:  [16] 16  (12/04 0533) BP: (109-125)/(53-85) 109/53 mmHg (12/04 0533) SpO2:  [94 %-96 %] 95 % (12/04 0533)  Intake/Output from previous day: 12/03 0701 - 12/04 0700 In: 810 [P.O.:810] Out: 500 [Urine:500] Intake/Output this shift:    No results found for this basename: HGB:5 in the last 72 hours No results found for this basename: WBC:2,RBC:2,HCT:2,PLT:2 in the last 72 hours  Basename 10/25/12 0436  NA 136  K 4.3  CL 105  CO2 23  BUN 17  CREATININE 1.54*  GLUCOSE 139*  CALCIUM 8.8   No results found for this basename: LABPT:2,INR:2 in the last 72 hours  Neurovascular intact Sensation intact distally Dorsiflexion/Plantar flexion intact Incision: no drainage  Assessment/Plan: 3 Days Post-Op Procedure(s) (LRB): OPEN REDUCTION INTERNAL FIXATION (ORIF) TIBIAL PLATEAU (Left) Discharge home with home health today.  Will travel home by ambulance. Ov 2 weeks with Dr Otelia Sergeant rx vicodin and robaxin.  Aspirin for dvt prophylaxis  [name] M 10/27/2012, 8:01 AM

## 2012-10-27 NOTE — Discharge Summary (Addendum)
Physician Discharge Summary  Patient ID: [name] MRN: [id number] DOB/AGE: Jul 03, 1935 77 y.o.  Admit date: 10/23/2012 Discharge date: 10/28/2012  Admission Diagnoses:  Closed fracture of lateral portion of left tibial plateau  Discharge Diagnoses:  Principal Problem:  *Closed fracture of lateral portion of left tibial plateau   Past Medical History  Diagnosis Date  . Hypertension   . Hyperthyroidism   . Diverticulosis   . Anemia   . Gout     Surgeries: Procedure(s): OPEN REDUCTION INTERNAL FIXATION (ORIF) TIBIAL PLATEAU on 10/23/2012 - 10/24/2012 with intermedullary nail left tibia   Consultants (if any):  none  Discharged Condition: Improved  Hospital Course: [name] is an 77 y.o. female who was admitted 10/23/2012 with a diagnosis of Closed fracture of lateral portion of left tibial plateau and went to the operating room on 10/23/2012 - 10/24/2012 and underwent the above named procedures.    She was given perioperative antibiotics:  Anti-infectives     Start     Dose/Rate Route Frequency Ordered Stop   10/24/12 1600   ceFAZolin (ANCEF) IVPB 1 g/50 mL premix        1 g 100 mL/hr over 30 Minutes Intravenous Every 6 hours 10/24/12 1423 10/25/12 0513   10/24/12 0421   ceFAZolin (ANCEF) IVPB 2 g/50 mL premix        2 g 100 mL/hr over 30 Minutes Intravenous 60 min pre-op 10/24/12 0421 10/24/12 1102        .  She was given sequential compression devices, early ambulation, and aspirin for DVT prophylaxis. Pt is non weight bearing to the left lower extremity.  Hinged knee brace for support with ROM as tolerated in brace.  Dressing changed daily and wounds healing well.  Will need dry dressing daily and may shower after staples removed in office in 2 weeks. Ice packs to knee daily or as needed for pain. She benefited maximally from the hospital stay and there were no complications.  Pt had considerable difficutly with ambulation and was felt by the therapist to  need short term NHP for further rehab.  Bed available at [facility] and pt in agreement for discharge to the facility.   Recent vital signs:  Filed Vitals:   10/27/12 0533  BP: 109/53  Pulse: 86  Temp: 98.9 F (37.2 C)  Resp: 16    Recent laboratory studies:  Lab Results  Component Value Date   HGB 9.7* 10/24/2012   HGB 11.9* 08/26/2011   HGB 11.6* 02/21/2010   Lab Results  Component Value Date   WBC 9.8 10/24/2012   PLT 340 10/24/2012   No results found for this basename: INR   Lab Results  Component Value Date   NA 136 10/25/2012   K 4.3 10/25/2012   CL 105 10/25/2012   CO2 23 10/25/2012   BUN 17 10/25/2012   CREATININE 1.54* 10/25/2012   GLUCOSE 139* 10/25/2012    Discharge Medications:     Medication List     As of 10/27/2012  8:10 AM    TAKE these medications         amLODipine 10 MG tablet   Commonly known as: NORVASC   Take 1 tablet (10 mg total) by mouth daily.      aspirin EC 325 MG tablet   Take 1 tablet (325 mg total) by mouth 2 (two) times daily after a meal.      aspirin 81 MG tablet   Take 81 mg by  mouth daily.      DSS 100 MG Caps   Take 100 mg by mouth 2 (two) times daily.      HYDROcodone-acetaminophen 5-325 MG per tablet   Commonly known as: NORCO/VICODIN   Take 1-2 tablets by mouth every 4 (four) hours as needed.      levothyroxine 75 MCG tablet   Commonly known as: SYNTHROID, LEVOTHROID   Take 1 tablet (75 mcg total) by mouth daily.      methocarbamol 500 MG tablet   Commonly known as: ROBAXIN   Take 1 tablet (500 mg total) by mouth every 6 (six) hours as needed.      valsartan 160 MG tablet   Commonly known as: DIOVAN   Take 1 tablet (160 mg total) by mouth daily.        Diagnostic Studies: Dg Tibia/fibula Left  10/24/2012  *RADIOLOGY REPORT*  Clinical Data: Left tibia fracture.  LEFT TIBIA AND FIBULA - 2 VIEW  Comparison: Radiographs dated 10/23/2012  Findings: AP and lateral C-arm images demonstrate the patient has undergone open  reduction and internal fixation of the comminuted depressed left lateral tibial plateau fracture.  Graft material has been inserted.  Side plate and six screws are noted.  The height of the lateral tibial plateau has been restored.  IMPRESSION: Open reduction and internal fixation of the lateral tibial plateau fracture.   Original Report Authenticated By: Francene Boyers, M.D.    Dg Chest Portable 1 View  10/24/2012  *RADIOLOGY REPORT*  Clinical Data: Preoperative radiograph for tibial plateau fracture.  PORTABLE CHEST - 1 VIEW  Comparison: Chest radiograph performed 12/25/2005  Findings: The lungs are well-aerated.  Mild vascular congestion is noted.  There is no evidence of focal opacification, pleural effusion or pneumothorax.  The cardiomediastinal silhouette is borderline normal in size.  No acute osseous abnormalities are seen.  Mild right convex thoracic scoliosis is seen.  IMPRESSION: Mild vascular congestion noted; lungs remain grossly clear.   Original Report Authenticated By: Tonia Ghent, M.D.    Dg Knee Complete 4 Views Left  10/23/2012  *RADIOLOGY REPORT*  Clinical Data: Fall with injury to left knee.  LEFT KNEE - COMPLETE 4+ VIEW  Comparison: None.  Findings: There is evidence of a comminuted and depressed fracture of the lateral tibial plateau.  Fracture planes extend into the tibial metaphysis and towards the central aspect of the tibia with evidence of probable fracture extension to the base of the tibial spines.  Small potential avulsion fracture is also seen adjacent to the lateral femoral condyle.  There is associated joint effusion. Significant proliferative changes are seen involving the patella.  IMPRESSION: Comminuted and depressed lateral tibial plateau fracture. Potential avulsion is also seen adjacent to the lateral femoral condyle.   Original Report Authenticated By: Irish Lack, M.D.    Dg C-arm 61-120 Min-no Report  10/24/2012  CLINICAL DATA: tibial plateau fx   C-ARM  61-120 MINUTES  Fluoroscopy was utilized by the requesting physician.  No radiographic  interpretation.      Disposition: SNF for short term rehab      Discharge Orders    Future Orders Please Complete By Expires   Diet - low sodium heart healthy      Call MD / Call 911      Comments:   If you experience chest pain or shortness of breath, CALL 911 and be transported to the hospital emergency room.  If you develope a fever above 101 F, pus (white drainage)  or increased drainage or redness at the wound, or calf pain, call your surgeon's office.   Constipation Prevention      Comments:   Drink plenty of fluids.  Prune juice may be helpful.  You may use a stool softener, such as Colace (over the counter) 100 mg twice a day.  Use MiraLax (over the counter) for constipation as needed.   Discharge instructions      Comments:   Keep short leg splint and dressing dry. May use water impervious bag or cast bag and tub chair to shower Tape the top of bag to skin to avoid moisture soaking the dressing on the leg. Call if there is odor or saturation of dressing or worsening pain not controlled with medications. Call if fever greater than 101.5. Use crutches or walker no weight bearing on the ankle fracture leg. Please follow up with an appointment with Dr. Otelia Sergeant  2 weeks from the time of surgery. Elevate as often as possible during the first week after surgery gradually increasing the time the leg is dependent or down there after. If swelling recurrs then elevate again. Wheel chair for longer distances. Take aspirin 325 mg  2 x a day with meal or snack.   Driving restrictions      Comments:   No driving for 8 weeks   Lifting restrictions      Comments:   No lifting for 6 weeks   Increase activity slowly as tolerated      Non weight bearing       physical therapy daily for ambulation and gait training.  OT daily for ADLs .  Follow-up Information    Follow up with NITKA,JAMES E, MD. Schedule  an appointment as soon as possible for a visit in 2 weeks.   Contact information:   [address] [place] [address] [place] [address] [phone]           Signed: Wende Neighbors 10/27/2012, 8:10 AM

## 2012-10-27 NOTE — Progress Notes (Signed)
Clinical Social Work Department BRIEF PSYCHOSOCIAL ASSESSMENT 10/27/2012  Patient:  [name], [name]     Account Number:  [id number]     Admit date:  10/23/2012  Clinical Social Worker:  Candie Chroman  Date/Time:  10/27/2012 01:20 PM  Referred by:  CSW  Date Referred:  10/27/2012 Referred for  SNF Placement   Other Referral:   Interview type:  Patient Other interview type:    PSYCHOSOCIAL DATA Living Status:  FAMILY Admitted from facility:   Level of care:   Primary support name:  Crystal Falter Primary support relationship to patient:  CHILD, ADULT Degree of support available:   supportive    CURRENT CONCERNS Current Concerns  Post-Acute Placement   Other Concerns:    SOCIAL WORK ASSESSMENT / PLAN Pt is a 77 yr  old female living at home prior to hospitalization. Pt was hoping to return home but is willing to accept [facility]. SNF search has been initiated and bed offers provided to pt's daughter. [employer] prior approval has been requested and approval # is pending. CSW will continue to follow to assist with d/c planning.   Assessment/plan status:  Psychosocial Support/Ongoing Assessment of Needs Other assessment/ plan:   Information/referral to community resources:   SNF list provided.    PATIENT'S/FAMILY'S RESPONSE TO PLAN OF CARE: Pt prefers to go home but understands rehab is needed.   Cori Razor LCSW [phone]

## 2012-10-27 NOTE — Progress Notes (Signed)
Physical Therapy Treatment Patient Details Name: [name] MRN: [id number] DOB: 01-25-1935 Today's Date: 10/27/2012 Time: 4098-1191 PT Time Calculation (min): 24 min  PT Assessment / Plan / Recommendation Comments on Treatment Session  Pt assisted back to bed and unable to ambulate today due to increased R knee pain.  Pt reports pain is present 5/10 with knee flexion and hopping specifically.  Pt also performed exercises in bed.  Pt reported no pain at rest.    Follow Up Recommendations  SNF     Does the patient have the potential to tolerate intense rehabilitation     Barriers to Discharge        Equipment Recommendations  Rolling walker with 5" wheels;3 in 1 bedside comode    Recommendations for Other Services    Frequency     Plan Discharge plan remains appropriate;Frequency remains appropriate    Precautions / Restrictions Precautions Precautions: Fall;Knee Required Braces or Orthoses: Other Brace/Splint Other Brace/Splint: bledsoe brace Restrictions Weight Bearing Restrictions: Yes LLE Weight Bearing: Non weight bearing   Pertinent Vitals/Pain No pain at rest, 5/10 with mobility R knee    Mobility  Bed Mobility Bed Mobility: Sit to Supine Sit to Supine: 3: Mod assist Details for Bed Mobility Assistance: verbal cues for technique assist required for bil LEs Transfers Transfers: Sit to Stand;Stand to Sit;Stand Pivot Transfers Sit to Stand: 3: Mod assist;With upper extremity assist;From chair/3-in-1 Stand to Sit: 3: Mod assist;With upper extremity assist;To bed Stand Pivot Transfers: 3: Mod assist Details for Transfer Assistance: verbal cues for hand placement and NWB L LE, attempted x2 due to pain in R knee, verbal cue to slide foot on floor since hopping causes more pain Ambulation/Gait Ambulation/Gait Assistance: Not tested (comment) (pt unable due to R knee pain)    Exercises General Exercises - Lower Extremity Ankle Circles/Pumps: AROM;20 reps;Right Quad  Sets: AROM;Right;15 reps Gluteal Sets: AROM;Both;20 reps Short Arc Quad: AROM;Right;15 reps Heel Slides: AAROM;Right;15 reps;Supine Hip ABduction/ADduction: Right;AROM;Strengthening;15 reps Straight Leg Raises: AAROM;Right;10 reps   PT Diagnosis:    PT Problem List:   PT Treatment Interventions:     PT Goals Acute Rehab PT Goals PT Goal: Sit to Supine/Side - Progress: Progressing toward goal PT Goal: Sit to Stand - Progress: Progressing toward goal PT Goal: Stand to Sit - Progress: Progressing toward goal PT Goal: Ambulate - Progress: Not progressing  Visit Information  Last PT Received On: 10/27/12 Assistance Needed: +1    Subjective Data  Subjective: My Right knee is bothering me.   Cognition  Overall Cognitive Status: Appears within functional limits for tasks assessed/performed    Balance     End of Session PT - End of Session Equipment Utilized During Treatment: Gait belt;Other (comment) (bledsoe) Activity Tolerance: Patient limited by fatigue;Patient limited by pain Patient left: in bed;with call bell/phone within reach   GP     [facility] E 10/27/2012, 3:37 PM Pager: [phone]

## 2012-10-28 NOTE — Progress Notes (Signed)
Subjective: 4 Days Post-Op Procedure(s) (LRB): OPEN REDUCTION INTERNAL FIXATION (ORIF) TIBIAL PLATEAU (Left) Patient reports pain as mild.   Pt unable to go home as mobility very limited and has agreed to short term SNF, bed available today and pt stable for discharge. Objective: Vital signs in last 24 hours: Temp:  [98.9 F (37.2 C)-99.5 F (37.5 C)] 99 F (37.2 C) (12/05 4098) Pulse Rate:  [87-90] 90  (12/05 0608) Resp:  [16] 16  (12/05 1191) BP: (116-137)/(56-69) 122/69 mmHg (12/05 4782) SpO2:  [94 %-99 %] 95 % (12/05 9562)  Intake/Output from previous day: 12/04 0701 - 12/05 0700 In: 120 [P.O.:120] Out: 700 [Urine:700] Intake/Output this shift:    No results found for this basename: HGB:5 in the last 72 hours No results found for this basename: WBC:2,RBC:2,HCT:2,PLT:2 in the last 72 hours No results found for this basename: NA:2,K:2,CL:2,CO2:2,BUN:2,CREATININE:2,GLUCOSE:2,CALCIUM:2 in the last 72 hours No results found for this basename: LABPT:2,INR:2 in the last 72 hours  Neurovascular intact Sensation intact distally Dorsiflexion/Plantar flexion intact Incision: no drainage  Assessment/Plan: 4 Days Post-Op Procedure(s) (LRB): OPEN REDUCTION INTERNAL FIXATION (ORIF) TIBIAL PLATEAU (Left) Discharge to SNF today  Naureen Benton M 10/28/2012, 8:08 AM

## 2012-10-28 NOTE — Progress Notes (Signed)
Report called to Misty Stanley, Charity fundraiser at [employer].

## 2012-10-28 NOTE — Discharge Summary (Signed)
Patient's Discharge summary reviewed with [facility].

## 2012-10-28 NOTE — Progress Notes (Signed)
FL2 in chart for MD signature. Pt has SNF bed at [facility] today if stable for d/c. CSW will assist with d/c planning to SNF.  Cori Razor LCSW [phone]

## 2012-10-28 NOTE — Progress Notes (Signed)
Patient examined and lab reviewed with Vernon PA-C. 

## 2012-10-28 NOTE — Progress Notes (Signed)
Pt stable...transported via [facility] to [facility] all paperwork sent with PT.

## 2012-10-28 NOTE — Progress Notes (Signed)
Patient's progress and revised discharge plans reviewed with [facility].

## 2012-10-29 NOTE — Progress Notes (Signed)
Clinical Social Work Department CLINICAL SOCIAL WORK PLACEMENT NOTE 10/29/2012  Patient:  [name], [name]  Account Number:  [id number] Admit date:  10/23/2012  Clinical Social Worker:  Cori Razor, LCSW  Date/time:  10/27/2012 01:34 PM  Clinical Social Work is seeking post-discharge placement for this patient at the following level of care:   SKILLED NURSING   (*CSW will update this form in Epic as items are completed)   10/27/2012  Patient/family provided with [facility] Department of Clinical Social Work's list of facilities offering this level of care within the geographic area requested by the patient (or if unable, by the patient's family).  10/27/2012  Patient/family informed of their freedom to choose among providers that offer the needed level of care, that participate in Medicare, Medicaid or managed care program needed by the patient, have an available bed and are willing to accept the patient.    Patient/family informed of [facility]' ownership interest in [facility], as well as of the fact that they are under no obligation to receive care at this facility.  PASARR submitted to E[employer] on 10/27/2012 PASARR number received from [employer] on 10/27/2012  FL2 transmitted to all facilities in geographic area requested by pt/family on  10/27/2012 FL2 transmitted to all facilities within larger geographic area on   Patient informed that his/her managed care company has contracts with or will negotiate with  certain facilities, including the following:     Patient/family informed of bed offers received:  10/27/2012 Patient chooses bed at [facility] Physician recommends and patient chooses bed at    Patient to be transferred to [facility] on  10/28/2012 Patient to be transferred to facility by [facility]  The following physician request were entered in Epic:   Additional Comments:  [employer] provided prior approval for SNF  and Ambulance transport.  Cori Razor LCSW [phone]

## 2012-11-12 LAB — VITAMIN D 1,25 DIHYDROXY
Vitamin D 1, 25 (OH)2 Total: 48
Vitamin D2 1, 25 (OH)2: <8
Vitamin D3 1, 25 (OH)2: 48

## 2012-11-30 ENCOUNTER — Telehealth: Payer: Self-pay | Admitting: Internal Medicine

## 2012-11-30 NOTE — Telephone Encounter (Signed)
Patient has been on Diovan and Lasix for Amy time. No need to change anything at this point

## 2012-11-30 NOTE — Telephone Encounter (Signed)
Caller: Nancy/Other; Phone: [phone]; Reason for Call: Harriett Sine RN/[facility] calling about medication interaction.  States patient taking diovan as well as 20mg  lasix.  States she believes Dr.  Drue Novel is aware of this, and has instructed the patient to not take the medications at the same time.  Info to office for provider review/callback.  May reach caller at [phone] krs/can

## 2012-11-30 NOTE — Telephone Encounter (Signed)
Discussed with [facility] nursing.

## 2012-11-30 NOTE — Telephone Encounter (Signed)
Please advise 

## 2012-12-07 ENCOUNTER — Ambulatory Visit (INDEPENDENT_AMBULATORY_CARE_PROVIDER_SITE_OTHER): Payer: Medicare Other | Admitting: Internal Medicine

## 2012-12-07 ENCOUNTER — Encounter: Payer: Self-pay | Admitting: Internal Medicine

## 2012-12-07 VITALS — BP 142/74 | HR 77 | Temp 97.7°F

## 2012-12-07 DIAGNOSIS — D649 Anemia, unspecified: Secondary | ICD-10-CM

## 2012-12-07 DIAGNOSIS — S82122A Displaced fracture of lateral condyle of left tibia, initial encounter for closed fracture: Secondary | ICD-10-CM

## 2012-12-07 DIAGNOSIS — H40009 Preglaucoma, unspecified, unspecified eye: Secondary | ICD-10-CM

## 2012-12-07 DIAGNOSIS — I10 Essential (primary) hypertension: Secondary | ICD-10-CM

## 2012-12-07 DIAGNOSIS — E0590 Thyrotoxicosis, unspecified without thyrotoxic crisis or storm: Secondary | ICD-10-CM

## 2012-12-07 DIAGNOSIS — H409 Unspecified glaucoma: Secondary | ICD-10-CM | POA: Insufficient documentation

## 2012-12-07 NOTE — Progress Notes (Signed)
  Subjective:    Patient ID: [name], female    DOB: 10-07-1935, 78 y.o.   MRN: [id number]  HPI Hospital followup Admitted to the hospital 10/23/2012 with a left tibial fracture, status post ORIF; subsequently she was discharged to rehab facility and now is back home. Chart, discharge summary and labs from recent admission reviewed. BMP last month was stable. Postop hemoglobin 9.5.  Past Medical History: G1 P1 Hypertension CRI--elevated creatinine 4-11: stage 3 CKD, sees nephrology, history of solitary kidney Heperthyroidism, s/p ablation, sees Dr Talmage Nap   H/o Gout Glaucoma suspect DERMATITIS   DIVERTICULOSIS, COLON   h/o elevated sed rate ABNORMAL  CT and MRI of pancreas, last MRI 1-09: after d/w GI we rec.  to f/u clinically and redo XRs if problems h/o ANEMIA    Past Surgical History  Procedure Date  . Cataract extraction w/ intraocular lens  implant, bilateral 2006  . Colonoscopy   . Orif tibia plateau 10/24/2012    Procedure: OPEN REDUCTION INTERNAL FIXATION (ORIF) TIBIAL PLATEAU;  Surgeon: Kerrin Champagne, MD;  Location: [facility];  Service: Orthopedics;  Laterality: Left;    Social History:  Divorced, lives with her sister , one daughter who lives here in town  tobacco-- never  ETOH-- never  diet-- regular  exercise-- walks 30 min a day   Review of Systems Post operative pain is better, not taking narcotics or muscle relaxants.  she gets a nurse to visit her twice a week, BP at home 134/68. To start postop physical therapy in a couple of days. Thyroid dose was temporarily change at the rehabilitation facility but she is back on 75 mcg daily, likes a TSH check.    Objective:   Physical Exam General -- alert, well-developed Lungs -- normal respiratory effort, no intercostal retractions, no accessory muscle use, and normal breath sounds.   Heart-- normal rate, regular rhythm, no murmur, and no gallop.   Extremities-- R : no pretibial edema; L +/+++ edema Psych--  Cognition and judgment appear intact. Alert and cooperative with normal attention span and concentration.  not anxious appearing and not depressed appearing.       Assessment & Plan:

## 2012-12-07 NOTE — Assessment & Plan Note (Signed)
Synthroid dose was increased temporarily to 100 mcg while at the rehabilitation facility, she is back on 75 mcg.  Plan-- labs, fax results to endocrinology

## 2012-12-07 NOTE — Assessment & Plan Note (Signed)
Ambulatory BPs wnl

## 2012-12-07 NOTE — Patient Instructions (Addendum)
Next visit in 4-5 months for a fasting, physical exam

## 2012-12-07 NOTE — Assessment & Plan Note (Signed)
Patient's ophthalmologist requested that B12 and folic acid levels due to the diagnosis of glaucoma suspect, will do.

## 2012-12-07 NOTE — Assessment & Plan Note (Signed)
Postop hemoglobin 9.5, checking a CBC today

## 2012-12-07 NOTE — Assessment & Plan Note (Signed)
Seems to be recovering well 

## 2012-12-07 NOTE — Assessment & Plan Note (Signed)
Last BMP a month ago and stable. No change

## 2012-12-08 LAB — CBC WITH DIFFERENTIAL/PLATELET
Basophils Relative: 0.5 % (ref 0.0–3.0)
Eosinophils Relative: 1.1 % (ref 0.0–5.0)
HCT: 32.5 % — ABNORMAL LOW (ref 36.0–46.0)
Hemoglobin: 10.8 g/dL — ABNORMAL LOW (ref 12.0–15.0)
Lymphs Abs: 2.7 10*3/uL (ref 0.7–4.0)
MCV: 90.7 fl (ref 78.0–100.0)
Monocytes Absolute: 0.3 10*3/uL (ref 0.1–1.0)
Monocytes Relative: 3.5 % (ref 3.0–12.0)
Platelets: 439 10*3/uL — ABNORMAL HIGH (ref 150.0–400.0)
RBC: 3.58 Mil/uL — ABNORMAL LOW (ref 3.87–5.11)
WBC: 7.9 10*3/uL (ref 4.5–10.5)

## 2012-12-08 LAB — FOLATE: Folate: 15 ng/mL (ref >5.9)

## 2012-12-09 ENCOUNTER — Encounter: Payer: Self-pay | Admitting: *Deleted

## 2013-01-18 ENCOUNTER — Telehealth: Payer: Self-pay | Admitting: *Deleted

## 2013-01-18 NOTE — Telephone Encounter (Signed)
Prior auth for Diovan 160mg  has been received. Form has been completed, waiting approval.

## 2013-01-19 ENCOUNTER — Encounter: Payer: Self-pay | Admitting: *Deleted

## 2013-01-19 NOTE — Telephone Encounter (Signed)
Pt daughter calling stating that PT has been out of Diovan for 2 day. Prior Berkley Harvey has been completed and faxed back just awaiting response. No samples available to give Pt while we wait for response. Marland KitchenPlease advise

## 2013-01-19 NOTE — Telephone Encounter (Signed)
Options: Pay out of pocket x a 2 week supply Less ideal, call losartan 100 mg 1 po qd #30, no RF to be taken until diovan available

## 2013-01-20 ENCOUNTER — Telehealth: Payer: Self-pay | Admitting: *Deleted

## 2013-01-20 NOTE — Telephone Encounter (Signed)
Prior [name] has been approved, pt & pharm made aware.

## 2013-01-20 NOTE — Telephone Encounter (Signed)
Call received from [facility] stating that prior auth for Diovan has been approved. Dates of approval begin on 01/19/13 and end on 01/19/14. Patient notified.

## 2013-02-01 ENCOUNTER — Ambulatory Visit: Payer: Medicare Other | Attending: Specialist | Admitting: Physical Therapy

## 2013-02-01 DIAGNOSIS — M25669 Stiffness of unspecified knee, not elsewhere classified: Secondary | ICD-10-CM | POA: Insufficient documentation

## 2013-02-01 DIAGNOSIS — M6281 Muscle weakness (generalized): Secondary | ICD-10-CM | POA: Insufficient documentation

## 2013-02-01 DIAGNOSIS — IMO0001 Reserved for inherently not codable concepts without codable children: Secondary | ICD-10-CM | POA: Insufficient documentation

## 2013-02-01 DIAGNOSIS — M25569 Pain in unspecified knee: Secondary | ICD-10-CM | POA: Insufficient documentation

## 2013-02-01 DIAGNOSIS — R262 Difficulty in walking, not elsewhere classified: Secondary | ICD-10-CM | POA: Insufficient documentation

## 2013-02-08 ENCOUNTER — Ambulatory Visit: Payer: Medicare Other | Admitting: Physical Therapy

## 2013-02-10 ENCOUNTER — Ambulatory Visit: Payer: Medicare Other | Admitting: Physical Therapy

## 2013-02-15 ENCOUNTER — Ambulatory Visit: Payer: Medicare Other | Admitting: Physical Therapy

## 2013-02-17 ENCOUNTER — Ambulatory Visit: Payer: Medicare Other | Admitting: Physical Therapy

## 2013-02-22 ENCOUNTER — Ambulatory Visit: Payer: Medicare Other | Attending: Specialist | Admitting: Physical Therapy

## 2013-02-22 DIAGNOSIS — M25669 Stiffness of unspecified knee, not elsewhere classified: Secondary | ICD-10-CM | POA: Insufficient documentation

## 2013-02-22 DIAGNOSIS — R262 Difficulty in walking, not elsewhere classified: Secondary | ICD-10-CM | POA: Insufficient documentation

## 2013-02-22 DIAGNOSIS — M25569 Pain in unspecified knee: Secondary | ICD-10-CM | POA: Insufficient documentation

## 2013-02-22 DIAGNOSIS — M6281 Muscle weakness (generalized): Secondary | ICD-10-CM | POA: Insufficient documentation

## 2013-02-22 DIAGNOSIS — IMO0001 Reserved for inherently not codable concepts without codable children: Secondary | ICD-10-CM | POA: Insufficient documentation

## 2013-02-24 ENCOUNTER — Ambulatory Visit: Payer: Medicare Other | Admitting: Physical Therapy

## 2013-03-01 ENCOUNTER — Ambulatory Visit: Payer: Medicare Other | Admitting: Physical Therapy

## 2013-03-03 ENCOUNTER — Ambulatory Visit: Payer: Medicare Other | Admitting: Physical Therapy

## 2013-03-14 ENCOUNTER — Ambulatory Visit: Payer: Medicare Other | Admitting: Physical Therapy

## 2013-03-17 ENCOUNTER — Ambulatory Visit: Payer: Medicare Other | Admitting: Physical Therapy

## 2013-03-21 ENCOUNTER — Ambulatory Visit: Payer: Medicare Other | Admitting: Physical Therapy

## 2013-03-22 ENCOUNTER — Ambulatory Visit: Payer: Medicare Other | Admitting: Physical Therapy

## 2013-04-04 ENCOUNTER — Encounter: Payer: Self-pay | Admitting: Internal Medicine

## 2013-04-04 ENCOUNTER — Ambulatory Visit (INDEPENDENT_AMBULATORY_CARE_PROVIDER_SITE_OTHER): Payer: Medicare Other | Admitting: Internal Medicine

## 2013-04-04 VITALS — BP 126/76 | HR 73 | Temp 97.8°F | Ht 64.5 in | Wt 149.0 lb

## 2013-04-04 DIAGNOSIS — I129 Hypertensive chronic kidney disease with stage 1 through stage 4 chronic kidney disease, or unspecified chronic kidney disease: Secondary | ICD-10-CM

## 2013-04-04 DIAGNOSIS — I10 Essential (primary) hypertension: Secondary | ICD-10-CM

## 2013-04-04 DIAGNOSIS — S82122S Displaced fracture of lateral condyle of left tibia, sequela: Secondary | ICD-10-CM

## 2013-04-04 DIAGNOSIS — S8290XS Unspecified fracture of unspecified lower leg, sequela: Secondary | ICD-10-CM

## 2013-04-04 LAB — BASIC METABOLIC PANEL
Chloride: 108 mEq/L (ref 96–112)
GFR: 43.75 mL/min — ABNORMAL LOW (ref >60.00)
Glucose, Bld: 80 mg/dL (ref 70–99)
Potassium: 4.6 mEq/L (ref 3.5–5.1)
Sodium: 137 mEq/L (ref 135–145)

## 2013-04-04 NOTE — Assessment & Plan Note (Signed)
BMP today

## 2013-04-04 NOTE — Assessment & Plan Note (Signed)
Seems well controlled, check a BMP 

## 2013-04-04 NOTE — Patient Instructions (Addendum)
Next visit by September 2014, fasting for a physical exam.

## 2013-04-04 NOTE — Assessment & Plan Note (Signed)
Status post L knee fracture 09-2012. Had a ORIF Status post physical therapy, still having some difficulties

## 2013-04-04 NOTE — Progress Notes (Signed)
  Subjective:    Patient ID: [name], female    DOB: 09/07/1935, 78 y.o.   MRN: [id number]  HPI ROV Hypertension-Good medication compliance, ambulatory BPs in the 130s/70s Thyroid disease, saw endocrinology a few days ago Chronic renal insufficiency-- No recent visit with nephrology Carries a diagnosis of gout, essentially asymptomatic.   Past Medical History:  Hypertension  CRI--elevated creatinine 4-11: stage 3 CKD, sees nephrology, history of solitary kidney  Heperthyroidism, s/p ablation, sees Dr Talmage Nap  H/o Gout  Glaucoma suspect  DERMATITIS  DIVERTICULOSIS, COLON  h/o elevated sed rate  ABNORMAL CT and MRI of pancreas, last MRI 1-09: after d/w GI we rec. to f/u clinically and redo XRs if problems  h/o ANEMIA     Past Surgical History  Procedure Laterality Date  . Cataract extraction w/ intraocular lens  implant, bilateral  2006  . Colonoscopy    . Orif tibia plateau  10/24/2012    Procedure: OPEN REDUCTION INTERNAL FIXATION (ORIF) TIBIAL PLATEAU;  Surgeon: Kerrin Champagne, MD;  Location: [facility];  Service: Orthopedics;  Laterality: Left;   Family History  Problem Relation Age of Onset  . Diabetes Mother   . Heart attack Sister   . Colon cancer Sister     colon  . Heart attack Brother   . Pancreatic cancer Brother   . Esophageal cancer Neg Hx   . Stomach cancer Neg Hx   . Rectal cancer Neg Hx   . Breast cancer Neg Hx    Social History: Divorced, lives with her sister   Has one daughter who lives here in town tobacco-- never ETOH-- never   diet-- regular        Review of Systems No CP SOB , no edema    Objective:   Physical Exam BP 126/76  Pulse 73  Temp(Src) 97.8 F (36.6 C) (Oral)  Ht 5' 4.5" (1.638 m)  Wt 149 lb (67.586 kg)  BMI 25.19 kg/m2  SpO2 98%  General -- alert, well-developed, No distress     Lungs -- normal respiratory effort, no intercostal retractions, no accessory muscle use, and normal breath sounds.   Heart-- normal rate,  regular rhythm, no murmur, and no gallop.   Extremities-- Still walks with some difficulty since then knee fracture, uses a cane but gait seems stable  Psych-- Cognition and judgment appear intact. Alert and cooperative with normal attention span and concentration.  not anxious appearing and not depressed appearing.       Assessment & Plan:

## 2013-04-07 ENCOUNTER — Encounter: Payer: Self-pay | Admitting: *Deleted

## 2013-07-09 ENCOUNTER — Other Ambulatory Visit: Payer: Self-pay | Admitting: Internal Medicine

## 2013-07-11 ENCOUNTER — Other Ambulatory Visit: Payer: Self-pay | Admitting: Internal Medicine

## 2013-07-11 NOTE — Telephone Encounter (Signed)
Spoke with pharmacy confirmed receipt of refill sent this morning. Request sent in error.

## 2013-07-11 NOTE — Telephone Encounter (Signed)
Refill done per protocol.  

## 2013-08-26 ENCOUNTER — Ambulatory Visit: Payer: Medicare Other | Admitting: Internal Medicine

## 2013-09-09 ENCOUNTER — Encounter: Payer: Self-pay | Admitting: Internal Medicine

## 2013-09-09 ENCOUNTER — Ambulatory Visit (INDEPENDENT_AMBULATORY_CARE_PROVIDER_SITE_OTHER): Payer: Medicare Other | Admitting: Internal Medicine

## 2013-09-09 VITALS — BP 180/72 | HR 80 | Temp 97.6°F | Wt 153.0 lb

## 2013-09-09 DIAGNOSIS — I10 Essential (primary) hypertension: Secondary | ICD-10-CM

## 2013-09-09 DIAGNOSIS — I129 Hypertensive chronic kidney disease with stage 1 through stage 4 chronic kidney disease, or unspecified chronic kidney disease: Secondary | ICD-10-CM

## 2013-09-09 DIAGNOSIS — R935 Abnormal findings on diagnostic imaging of other abdominal regions, including retroperitoneum: Secondary | ICD-10-CM

## 2013-09-09 DIAGNOSIS — E0590 Thyrotoxicosis, unspecified without thyrotoxic crisis or storm: Secondary | ICD-10-CM

## 2013-09-09 LAB — COMPREHENSIVE METABOLIC PANEL
ALT: 10 U/L (ref 0–35)
CO2: 25 mEq/L (ref 19–32)
Calcium: 9.9 mg/dL (ref 8.4–10.5)
Chloride: 104 mEq/L (ref 96–112)
GFR: 39.4 mL/min — ABNORMAL LOW (ref >60.00)
Glucose, Bld: 75 mg/dL (ref 70–99)
Sodium: 138 mEq/L (ref 135–145)
Total Protein: 8.6 g/dL — ABNORMAL HIGH (ref 6.0–8.3)

## 2013-09-09 MED ORDER — AMLODIPINE BESYLATE 10 MG PO TABS: 10.0000 mg | ORAL_TABLET | Freq: Every day | ORAL | Status: DC

## 2013-09-09 MED ORDER — VALSARTAN 160 MG PO TABS: 160.0000 mg | ORAL_TABLET | Freq: Every day | ORAL | Status: DC

## 2013-09-09 NOTE — Progress Notes (Signed)
  Subjective:    Patient ID: [name], female    DOB: Sep 13, 1935, 78 y.o.   MRN: [id number]  HPI Routine office visit Good medication compliance, BP was noted to be elevated, reports normal ambulatory BPs. See review of systems, no obvious triggers for elevated BP.     Past Medical History  Diagnosis Date  . Hypertension   . Hyperthyroidism     s/p ablation, Dr Lisabeth Devoid  . Diverticulosis   . Anemia   . Gout     h/o   . CRI (chronic renal insufficiency)     elevated creatinine 4-11: stage 3 CKD, sees nephrology, history of solitary   . Glaucoma suspect   . Abnormal MRI of abdomen     ABNORMAL CT and MRI of pancreas, last MRI 1-09: after d/w GI we rec. to f/u clinically and redo XRs if problems     Past Surgical History  Procedure Laterality Date  . Cataract extraction w/ intraocular lens  implant, bilateral  2006  . Colonoscopy    . Orif tibia plateau  10/24/2012    Procedure: OPEN REDUCTION INTERNAL FIXATION (ORIF) TIBIAL PLATEAU;  Surgeon: Kerrin Champagne, MD;  Location: [facility];  Service: Orthopedics;  Laterality: Left;   History   Social History  . Marital Status: Legally Separated    Spouse Name: N/A    Number of Children: 1  . Years of Education: N/A   Occupational History  . retired     Social History Main Topics  . Smoking status: Never Smoker   . Smokeless tobacco: Never Used  . Alcohol Use: No  . Drug Use: No  . Sexual Activity: Not on file   Other Topics Concern  . Not on file   Social History Narrative   Divorced, lives with her sister     Has one daughter who lives here in town      Review of Systems Diet-- no increase salt intake  no HAs No  CP, SOB, lower extremity edema Denies  nausea, vomiting diarrhea No dysuria, gross hematuria, difficulty urinating   Increased stress (personal issues, did not like to discuss further), but no actual anxiety, depression       Objective:   Physical Exam BP 180/72  Pulse 80  Temp(Src) 97.6 F (36.4  C)  Wt 153 lb (69.4 kg)  BMI 25.87 kg/m2  SpO2 98% General -- alert, well-developed, NAD.  Neck --no thyromegaly  Lungs -- normal respiratory effort, no intercostal retractions, no accessory muscle use, and normal breath sounds.  Heart-- normal rate, regular rhythm, no murmur.   Extremities-- no pretibial edema bilaterally  Neurologic--  alert & oriented X3. Speech normal, gait normal, strength normal in all extremities  Psych-- Cognition and judgment appear intact. Cooperative with normal attention span and concentration. No anxious appearing , no depressed appearing.     Assessment & Plan:

## 2013-09-09 NOTE — Assessment & Plan Note (Addendum)
BP today slightly elevated, she went to nephrology 8-14, BP was 140/56. No obvious reason for her BP to be elevated.   Plan: cmp, tsh, Self monitor BPs, see instructions.

## 2013-09-09 NOTE — Assessment & Plan Note (Signed)
Saw renal 06-2013 felt to be stable and recommended to followup as needed

## 2013-09-09 NOTE — Patient Instructions (Signed)
Get your blood work before you leave  Next visit in 3 months for a physical exam    Check the  blood pressure 2 or 3 times a   week be sure it is between 110/60 and 140/85. Ideal blood pressure is 120/80. If it is consistently higher or lower, let me know  Call with your BP readings in 2 weeks

## 2013-09-10 ENCOUNTER — Encounter: Payer: Self-pay | Admitting: Internal Medicine

## 2013-09-10 NOTE — Assessment & Plan Note (Signed)
ABNORMAL CT and MRI of pancreas, last MRI 1-09: after d/w GI we rec. to f/u clinically and redo XRs if problems

## 2013-09-12 ENCOUNTER — Encounter: Payer: Self-pay | Admitting: *Deleted

## 2013-09-27 ENCOUNTER — Other Ambulatory Visit: Payer: Self-pay | Admitting: Internal Medicine

## 2013-09-27 NOTE — Telephone Encounter (Signed)
Amlodipine refill sent to pharmcy

## 2013-09-30 ENCOUNTER — Other Ambulatory Visit: Payer: Self-pay | Admitting: Internal Medicine

## 2013-09-30 NOTE — Telephone Encounter (Signed)
Amlodipine refill sent to pharmacy 

## 2013-12-09 ENCOUNTER — Telehealth: Payer: Self-pay

## 2013-12-09 NOTE — Telephone Encounter (Signed)
Patient cancelled

## 2013-12-09 NOTE — Telephone Encounter (Signed)
Left message for call back  identifiable  [facility]S--08/2012--Dr Perry--adenomatous polyps--due 2018 [facility]--07/2012--neg Tdap--03/2010

## 2013-12-13 ENCOUNTER — Encounter: Payer: Medicare Other | Admitting: Internal Medicine

## 2013-12-26 ENCOUNTER — Telehealth: Payer: Self-pay

## 2013-12-26 NOTE — Telephone Encounter (Signed)
Phone not accepting calls.   [facility]--08/2012--Dr Perry--adenomatous polyps--due 2018  [facility]--07/2012--neg  Tdap--03/2010

## 2013-12-28 NOTE — Telephone Encounter (Signed)
Left message with daughter of appt. Patient unavailable. Unable to confirm meds, etc

## 2013-12-29 ENCOUNTER — Ambulatory Visit (INDEPENDENT_AMBULATORY_CARE_PROVIDER_SITE_OTHER): Payer: Medicare HMO | Admitting: Internal Medicine

## 2013-12-29 ENCOUNTER — Encounter: Payer: Self-pay | Admitting: Internal Medicine

## 2013-12-29 VITALS — BP 162/71 | HR 84 | Temp 98.2°F | Ht 65.0 in | Wt 159.0 lb

## 2013-12-29 DIAGNOSIS — I10 Essential (primary) hypertension: Secondary | ICD-10-CM

## 2013-12-29 DIAGNOSIS — N183 Chronic kidney disease, stage 3 unspecified: Secondary | ICD-10-CM

## 2013-12-29 DIAGNOSIS — Z0000 Encounter for general adult medical examination without abnormal findings: Secondary | ICD-10-CM

## 2013-12-29 DIAGNOSIS — Z23 Encounter for immunization: Secondary | ICD-10-CM

## 2013-12-29 DIAGNOSIS — H409 Unspecified glaucoma: Secondary | ICD-10-CM

## 2013-12-29 DIAGNOSIS — D649 Anemia, unspecified: Secondary | ICD-10-CM

## 2013-12-29 NOTE — Assessment & Plan Note (Signed)
Check labs 

## 2013-12-29 NOTE — Progress Notes (Signed)
Pre visit review using our clinic review tool, if applicable. No additional management support is needed unless otherwise documented below in the visit note. 

## 2013-12-29 NOTE — Assessment & Plan Note (Signed)
Check a CBC

## 2013-12-29 NOTE — Assessment & Plan Note (Addendum)
Td--2011 Pneumonia shot-- agreed to take declined flu, shingles shot    cscope  2008, no polyps cscope again 08/2012--Dr Perry--adenomatous polyps--due 2018  H/o several PAPs before, never had an abnormal one, married once, low risk; this year she confirmed is not interested in further PAPs  07-2012 [facility] (-) ,  breast exam done today and normal,  No family history of breast cancer,further screening at the discretion of the patient.  family history coronary artery disease, good compliance w/  aspirin 81 mg daily  Reports a DEXA x 1  (normal) @ Dr Chalmers Cater   Counseled about diet; not very active since she had a fracture in 2013, recommend to gradually get back on a routine exercise

## 2013-12-29 NOTE — Patient Instructions (Signed)
Get your blood work before you leave   Next visit is for routine check up regards  Hypertension and kidney  in 6 months  No need to come back fasting Please make an appointment     Fall Prevention and [place] cause injuries and can affect all age groups. It is possible to use preventive measures to significantly decrease the likelihood of falls. There are many simple measures which can make your home safer and prevent falls. OUTDOORS  Repair cracks and edges of walkways and driveways.  Remove high doorway thresholds.  Trim shrubbery on the main path into your home.  Have good outside lighting.  Clear walkways of tools, rocks, debris, and clutter.  Check that handrails are not broken and are securely fastened. Both sides of steps should have handrails.  Have leaves, snow, and ice cleared regularly.  Use sand or salt on walkways during winter months.  In the garage, clean up grease or oil spills. BATHROOM  Install night lights.  Install grab bars by the toilet and in the tub and shower.  Use non-skid mats or decals in the tub or shower.  Place a plastic non-slip stool in the shower to sit on, if needed.  Keep floors dry and clean up all water on the floor immediately.  Remove soap buildup in the tub or shower on a regular basis.  Secure bath mats with non-slip, double-sided rug tape.  Remove throw rugs and tripping hazards from the floors. BEDROOMS  Install night lights.  Make sure a bedside light is easy to reach.  Do not use oversized bedding.  Keep a telephone by your bedside.  Have a firm chair with side arms to use for getting dressed.  Remove throw rugs and tripping hazards from the floor. KITCHEN  Keep handles on pots and pans turned toward the center of the stove. Use back burners when possible.  Clean up spills quickly and allow time for drying.  Avoid walking on wet floors.  Avoid hot utensils and knives.  Position shelves so they  are not too high or low.  Place commonly used objects within easy reach.  If necessary, use a sturdy step stool with a grab bar when reaching.  Keep electrical cables out of the way.  Do not use floor polish or wax that makes floors slippery. If you must use wax, use non-skid floor wax.  Remove throw rugs and tripping hazards from the floor. STAIRWAYS  Never leave objects on stairs.  Place handrails on both sides of stairways and use them. Fix any loose handrails. Make sure handrails on both sides of the stairways are as Ala as the stairs.  Check carpeting to make sure it is firmly attached along stairs. Make repairs to worn or loose carpet promptly.  Avoid placing throw rugs at the top or bottom of stairways, or properly secure the rug with carpet tape to prevent slippage. Get rid of throw rugs, if possible.  Have an electrician put in a light switch at the top and bottom of the stairs. OTHER FALL PREVENTION TIPS  Wear low-heel or rubber-soled shoes that are supportive and fit well. Wear closed toe shoes.  When using a stepladder, make sure it is fully opened and both spreaders are firmly locked. Do not climb a closed stepladder.  Add color or contrast paint or tape to grab bars and handrails in your home. Place contrasting color strips on first and last steps.  Learn and use mobility aids as  needed. Install an electrical emergency response system.  Turn on lights to avoid dark areas. Replace light bulbs that burn out immediately. Get light switches that glow.  Arrange furniture to create clear pathways. Keep furniture in the same place.  Firmly attach carpet with non-skid or double-sided tape.  Eliminate uneven floor surfaces.  Select a carpet pattern that does not visually hide the edge of steps.  Be aware of all pets. OTHER HOME SAFETY TIPS  Set the water temperature for 120 F (48.8 C).  Keep emergency numbers on or near the telephone.  Keep smoke detectors on  every level of the home and near sleeping areas. Document Released: 10/31/2002 Document Revised: 05/11/2012 Document Reviewed: 01/30/2012 [facility] Patient Information 2014 [place].

## 2013-12-29 NOTE — Progress Notes (Signed)
Subjective:    Patient ID: [name], female    DOB: [birth year], 79 y.o.   MRN: [id number]  HPI Here for Medicare AWV: 1. Risk factors based on Past M, S, F history: reviewed 2. Physical Activities:  Not as active, had knee problems  3. Depression/mood: No problems noted or reported 4. Hearing:  No problems noted or reported 5. ADL's:  Independent, still drives   6. Fall Risk: tibia fx 10-2012 , fall prevention discussed   7. home Safety: does feel safe at home   8. Height, weight, &visual acuity: see VS, no vision problems , sees Dr Herbert Deaner regularly , dx w/ glaucoma     9. Counseling: provided 10. Labs ordered based on risk factors: if needed   11. Referral Coordination: if needed 12.  Care Plan, see assessment and plan   13.   Cognitive Assessment: Cognition and motor skills appropriate for age  In addition, today we discussed the following: Hypertension, good medication compliance, BP today slightly elevated, at home is typically 137/72, never more than 140. Thyroid disease--sees Dr. Chalmers Cater from time to time, good compliance with medication. History of anemia--review of systems negative, due for a CBC Chronic renal insufficiency--nephrology will follow up as needed, labs today  Past Medical History  Diagnosis Date  . Hypertension   . Hyperthyroidism     s/p ablation, Dr Debbora Presto  . Diverticulosis   . Anemia   . Gout     h/o   . CRI (chronic renal insufficiency)     elevated creatinine 4-11: stage 3 CKD, sees nephrology, history of solitary   . Glaucoma   . Abnormal MRI of abdomen     ABNORMAL CT and MRI of pancreas, last MRI 1-09: after d/w GI we rec. to f/u clinically and redo XRs if problems     Past Surgical History  Procedure Laterality Date  . Cataract extraction w/ intraocular lens  implant, bilateral  2006  . Colonoscopy    . Orif tibia plateau  10/24/2012    Procedure: OPEN REDUCTION INTERNAL FIXATION (ORIF) TIBIAL PLATEAU;  Surgeon: Jessy Oto, MD;   Location: [facility];  Service: Orthopedics;  Laterality: Left;   History   Social History  . Marital Status: Legally Separated    Spouse Name: N/A    Number of Children: 1  . Years of Education: N/A   Occupational History  . retired     Social History Main Topics  . Smoking status: Never Smoker   . Smokeless tobacco: Never Used  . Alcohol Use: No  . Drug Use: No  . Sexual Activity: Not on file   Other Topics Concern  . Not on file   Social History Narrative   Divorced, lives with her sister     Has one daughter who lives here in town   Family History  Problem Relation Age of Onset  . Diabetes Mother   . Heart attack Sister   . Colon cancer Sister     colon  . Heart attack Brother   . Pancreatic cancer Brother   . Esophageal cancer Neg Hx   . Stomach cancer Neg Hx   . Rectal cancer Neg Hx   . Breast cancer Neg Hx      Review of Systems No  CP, SOB, lower extremity edema Denies  nausea, vomiting diarrhea Denies  blood in the stools (-) cough, sputum production (-) wheezing, chest congestion No dysuria, gross hematuria, difficulty urinating  Objective:   Physical Exam BP 162/71  Pulse 84  Temp(Src) 98.2 F (36.8 C)  Ht 5\' 5"  (1.651 m)  Wt 159 lb (72.122 kg)  BMI 26.46 kg/m2  SpO2 98% General -- alert, well-developed, NAD.  Neck --no thyromegaly , normal carotid pulse  Breast-- no dominant mass, skin and nipples normal to inspection on palpation, axillary areas without mass or lymphadenopathy Lungs -- normal respiratory effort, no intercostal retractions, no accessory muscle use, and normal breath sounds.  Heart-- normal rate, regular rhythm, no murmur.  Abdomen-- Not distended, good bowel sounds,soft, non-tender. No bruit Extremities-- no pretibial edema bilaterally  Neurologic--  alert & oriented X3. Speech normal, gait normal, strength normal in all extremities.  Psych-- Cognition and judgment appear intact. Cooperative with normal attention  span and concentration. No anxious or depressed appearing.      Assessment & Plan:

## 2013-12-29 NOTE — Assessment & Plan Note (Signed)
Diagnosed with glaucoma, followup by ophthalmology

## 2013-12-29 NOTE — Assessment & Plan Note (Signed)
BP slightly elevated today but usually well controlled, no change, labs.

## 2013-12-30 ENCOUNTER — Telehealth: Payer: Self-pay | Admitting: Internal Medicine

## 2013-12-30 LAB — CBC WITH DIFFERENTIAL/PLATELET
BASOS PCT: 0.5 % (ref 0.0–3.0)
Basophils Absolute: 0 10*3/uL (ref 0.0–0.1)
EOS ABS: 0.3 10*3/uL (ref 0.0–0.7)
Eosinophils Relative: 4.9 % (ref 0.0–5.0)
HCT: 35.6 % — ABNORMAL LOW (ref 36.0–46.0)
HEMOGLOBIN: 11.5 g/dL — AB (ref 12.0–15.0)
Lymphocytes Relative: 32.2 % (ref 12.0–46.0)
Lymphs Abs: 1.9 10*3/uL (ref 0.7–4.0)
MCHC: 32.4 g/dL (ref 30.0–36.0)
MCV: 92.5 fl (ref 78.0–100.0)
MONOS PCT: 6.1 % (ref 3.0–12.0)
Monocytes Absolute: 0.4 10*3/uL (ref 0.1–1.0)
NEUTROS ABS: 3.4 10*3/uL (ref 1.4–7.7)
NEUTROS PCT: 56.3 % (ref 43.0–77.0)
Platelets: 355 10*3/uL (ref 150.0–400.0)
RBC: 3.85 Mil/uL — AB (ref 3.87–5.11)
RDW: 16.6 % — ABNORMAL HIGH (ref 11.5–14.6)
WBC: 6 10*3/uL (ref 4.5–10.5)

## 2013-12-30 LAB — COMPREHENSIVE METABOLIC PANEL
ALT: 11 U/L (ref 0–35)
AST: 17 U/L (ref 0–37)
Albumin: 3.4 g/dL — ABNORMAL LOW (ref 3.5–5.2)
Alkaline Phosphatase: 86 U/L (ref 39–117)
BUN: 18 mg/dL (ref 6–23)
CO2: 26 mEq/L (ref 19–32)
CREATININE: 1.6 mg/dL — AB (ref 0.4–1.2)
Calcium: 9.5 mg/dL (ref 8.4–10.5)
Chloride: 107 mEq/L (ref 96–112)
GFR: 41.11 mL/min — ABNORMAL LOW (ref >60.00)
GLUCOSE: 72 mg/dL (ref 70–99)
Potassium: 4.6 mEq/L (ref 3.5–5.1)
Sodium: 138 mEq/L (ref 135–145)
Total Bilirubin: 0.5 mg/dL (ref 0.3–1.2)
Total Protein: 7.9 g/dL (ref 6.0–8.3)

## 2013-12-30 LAB — LIPID PANEL
Cholesterol: 122 mg/dL (ref 0–200)
HDL: 43.3 mg/dL (ref >39.00)
LDL CALC: 62 mg/dL (ref 0–99)
TRIGLYCERIDES: 83 mg/dL (ref 0.0–149.0)
Total CHOL/HDL Ratio: 3
VLDL: 16.6 mg/dL (ref 0.0–40.0)

## 2013-12-30 NOTE — Telephone Encounter (Signed)
Relevant patient education mailed to patient.  

## 2014-01-03 ENCOUNTER — Encounter: Payer: Self-pay | Admitting: *Deleted

## 2014-01-09 ENCOUNTER — Other Ambulatory Visit: Payer: Self-pay | Admitting: Internal Medicine

## 2014-02-06 ENCOUNTER — Encounter: Payer: Self-pay | Admitting: Physician Assistant

## 2014-02-06 ENCOUNTER — Ambulatory Visit (INDEPENDENT_AMBULATORY_CARE_PROVIDER_SITE_OTHER): Payer: Medicare HMO | Admitting: Physician Assistant

## 2014-02-06 VITALS — BP 136/74 | HR 80 | Temp 97.5°F | Resp 16 | Ht 65.0 in | Wt 150.5 lb

## 2014-02-06 DIAGNOSIS — M109 Gout, unspecified: Secondary | ICD-10-CM

## 2014-02-06 MED ORDER — COLCHICINE 0.6 MG PO CAPS: ORAL_CAPSULE | ORAL | Status: DC

## 2014-02-06 NOTE — Progress Notes (Signed)
Pre visit review using our clinic review tool, if applicable. No additional management support is needed unless otherwise documented below in the visit note/SLS  

## 2014-02-06 NOTE — Patient Instructions (Signed)
Please take medication as directed.  Apply ice to affected area.  Elevate hand.  If symptoms are not improving over the next day, please call or return to clinic.  If you develop fever, chills then please proceed to the ER.

## 2014-02-06 NOTE — Assessment & Plan Note (Signed)
Acute attack.  Symptoms are improving on their own and no systemic symptoms, so septic arthritis much less likely.  Rx colchicine. Elevate extremity.  Apply ice to area.  Patient to closely monitor symptoms.  If no improvement in 24 hours or if symptoms worsen patient is to return to clinic.

## 2014-02-06 NOTE — Progress Notes (Signed)
Patient presents to clinic today c/o 4 days of tenderness, redness and swelling of her left wrist.  Patient has a history of gout.  Patient denies trauma, injury.  Denies numbness or tingling of L wrist or hand.  Endorses some mild decrease in ROM secondary to swelling and tenderness.  Denies fever, chills, malaise or fatigue.  Patient states her symptoms have been improving over the past 2 days.  Has been taking NSAIDs with some relief of tenderness.  Past Medical History  Diagnosis Date  . Hypertension   . Hyperthyroidism     s/p ablation, Dr Debbora Presto  . Diverticulosis   . Anemia   . Gout     h/o   . CRI (chronic renal insufficiency)     elevated creatinine 4-11: stage 3 CKD, sees nephrology, history of solitary   . Glaucoma   . Abnormal MRI of abdomen     ABNORMAL CT and MRI of pancreas, last MRI 1-09: after d/w GI we rec. to f/u clinically and redo XRs if problems      Current Outpatient Prescriptions on File Prior to Visit  Medication Sig Dispense Refill  . amLODipine (NORVASC) 10 MG tablet TAKE 1 TABLET (10 MG TOTAL) BY MOUTH DAILY.  90 tablet  1  . aspirin 81 MG tablet Take 81 mg by mouth daily.        Marland Kitchen latanoprost (XALATAN) 0.005 % ophthalmic solution 1 drop at bedtime.      Marland Kitchen levothyroxine (SYNTHROID, LEVOTHROID) 88 MCG tablet Take 88 mcg by mouth daily before breakfast.      . valsartan (DIOVAN) 160 MG tablet TAKE 1 TABLET (160 MG TOTAL) BY MOUTH DAILY.  90 tablet  1   No current facility-administered medications on file prior to visit.    No Known Allergies  Family History  Problem Relation Age of Onset  . Diabetes Mother   . Heart attack Sister   . Colon cancer Sister     colon  . Heart attack Brother   . Pancreatic cancer Brother   . Esophageal cancer Neg Hx   . Stomach cancer Neg Hx   . Rectal cancer Neg Hx   . Breast cancer Neg Hx     History   Social History  . Marital Status: Legally Separated    Spouse Name: N/A    Number of Children: 1  . Years of  Education: N/A   Occupational History  . retired     Social History Main Topics  . Smoking status: Never Smoker   . Smokeless tobacco: Never Used  . Alcohol Use: No  . Drug Use: No  . Sexual Activity: None   Other Topics Concern  . None   Social History Narrative   Divorced, lives with her sister     Has one daughter who lives here in town    Review of Systems - See HPI.  All other ROS are negative.  BP 136/74  Pulse 80  Temp(Src) 97.5 F (36.4 C) (Oral)  Resp 16  Ht 5\' 5"  (1.651 m)  Wt 150 lb 8 oz (68.266 kg)  BMI 25.04 kg/m2  SpO2 100%  Physical Exam  Vitals reviewed. Constitutional: She is oriented to person, place, and time and well-developed, well-nourished, and in no distress.  HENT:  Head: Normocephalic and atraumatic.  Eyes: Conjunctivae are normal.  Cardiovascular: Normal rate, regular rhythm and normal heart sounds.   Pulmonary/Chest: Effort normal and breath sounds normal.  Musculoskeletal:  Left wrist: She exhibits decreased range of motion, tenderness and swelling. She exhibits no bony tenderness, no effusion, no crepitus, no deformity and no laceration.  Mild erythema or wrist joint noted.  Neurological: She is alert and oriented to person, place, and time.  Skin: Skin is warm and dry.  Psychiatric: Affect normal.    Recent Results (from the past 2160 hour(s))  COMPREHENSIVE METABOLIC PANEL     Status: Abnormal   Collection Time    12/29/13 11:34 AM      Result Value Ref Range   Sodium 138  135 - 145 mEq/L   Potassium 4.6  3.5 - 5.1 mEq/L   Chloride 107  96 - 112 mEq/L   CO2 26  19 - 32 mEq/L   Glucose, Bld 72  70 - 99 mg/dL   BUN 18  6 - 23 mg/dL   Creatinine, Ser 1.6 (*) 0.4 - 1.2 mg/dL   Total Bilirubin 0.5  0.3 - 1.2 mg/dL   Alkaline Phosphatase 86  39 - 117 U/L   AST 17  0 - 37 U/L   ALT 11  0 - 35 U/L   Total Protein 7.9  6.0 - 8.3 g/dL   Albumin 3.4 (*) 3.5 - 5.2 g/dL   Calcium 9.5  8.4 - 10.5 mg/dL   GFR 41.11 (*) >60.00  mL/min  CBC WITH DIFFERENTIAL     Status: Abnormal   Collection Time    12/29/13 11:34 AM      Result Value Ref Range   WBC 6.0  4.5 - 10.5 K/uL   RBC 3.85 (*) 3.87 - 5.11 Mil/uL   Hemoglobin 11.5 (*) 12.0 - 15.0 g/dL   HCT 35.6 (*) 36.0 - 46.0 %   MCV 92.5  78.0 - 100.0 fl   MCHC 32.4  30.0 - 36.0 g/dL   RDW 16.6 (*) 11.5 - 14.6 %   Platelets 355.0  150.0 - 400.0 K/uL   Neutrophils Relative % 56.3  43.0 - 77.0 %   Lymphocytes Relative 32.2  12.0 - 46.0 %   Monocytes Relative 6.1  3.0 - 12.0 %   Eosinophils Relative 4.9  0.0 - 5.0 %   Basophils Relative 0.5  0.0 - 3.0 %   Neutro Abs 3.4  1.4 - 7.7 K/uL   Lymphs Abs 1.9  0.7 - 4.0 K/uL   Monocytes Absolute 0.4  0.1 - 1.0 K/uL   Eosinophils Absolute 0.3  0.0 - 0.7 K/uL   Basophils Absolute 0.0  0.0 - 0.1 K/uL  LIPID PANEL     Status: None   Collection Time    12/29/13 11:34 AM      Result Value Ref Range   Cholesterol 122  0 - 200 mg/dL   Comment: ATP III Classification       Desirable:  < 200 mg/dL               Borderline High:  200 - 239 mg/dL          High:  > = 240 mg/dL   Triglycerides 83.0  0.0 - 149.0 mg/dL   Comment: Normal:  <150 mg/dLBorderline High:  150 - 199 mg/dL   HDL 43.30  >39.00 mg/dL   VLDL 16.6  0.0 - 40.0 mg/dL   LDL Cholesterol 62  0 - 99 mg/dL   Total CHOL/HDL Ratio 3     Comment:                Men  Women1/2 Average Risk     3.4          3.3Average Risk          5.0          4.42X Average Risk          9.6          7.13X Average Risk          15.0          11.0                        Assessment/Plan: Gout Acute attack.  Symptoms are improving on their own and no systemic symptoms, so septic arthritis much less likely.  Rx colchicine. Elevate extremity.  Apply ice to area.  Patient to closely monitor symptoms.  If no improvement in 24 hours or if symptoms worsen patient is to return to clinic.

## 2014-03-09 ENCOUNTER — Telehealth: Payer: Self-pay | Admitting: Internal Medicine

## 2014-03-09 NOTE — Telephone Encounter (Signed)
Patient called and stated that she needed two referrals.  1. [facility] Op[facility] dr Gareth Eagle April 23rd at 10:45 am for visual test Phone # [phone] 2.[facility]  Dr Michiel Sites [address] at 11:1 5am follow up for tsh phone # [phone]  [facility] TD#[phone]

## 2014-03-10 NOTE — Telephone Encounter (Signed)
Referrals faxed to [facility]. Awaiting approval.

## 2014-03-13 NOTE — Telephone Encounter (Signed)
Dr. Chalmers CaterJosem Kaufmann # [id number] Valid 03/21/14-06/19/14 # Visits - 4

## 2014-03-28 NOTE — Telephone Encounter (Signed)
Dr. Leticia Clas auth # [id number] valid 03/16/14-06/14/14 # visits: 4

## 2014-06-27 ENCOUNTER — Ambulatory Visit: Payer: Medicare HMO | Admitting: Internal Medicine

## 2014-07-14 ENCOUNTER — Ambulatory Visit: Payer: Medicare HMO | Admitting: Internal Medicine

## 2014-09-21 ENCOUNTER — Other Ambulatory Visit: Payer: Self-pay

## 2014-09-21 MED ORDER — VALSARTAN 160 MG PO TABS: ORAL_TABLET | ORAL | Status: DC

## 2014-09-27 ENCOUNTER — Other Ambulatory Visit: Payer: Self-pay | Admitting: Internal Medicine

## 2014-10-31 ENCOUNTER — Telehealth: Payer: Self-pay

## 2014-10-31 ENCOUNTER — Other Ambulatory Visit: Payer: Self-pay

## 2014-10-31 ENCOUNTER — Ambulatory Visit: Payer: Medicare HMO | Admitting: Internal Medicine

## 2014-10-31 ENCOUNTER — Telehealth: Payer: Self-pay | Admitting: *Deleted

## 2014-10-31 MED ORDER — VALSARTAN 160 MG PO TABS: ORAL_TABLET | ORAL | Status: DC

## 2014-10-31 NOTE — Telephone Encounter (Signed)
Pt got lost and showed up at 2:50pm for her appointment at 2:15pm on 10/31/2014.  We rescheduled her for 11/27/2014 at 2:15 for medication review.  The only medication she needed a refill on was valsartan (DIOVAN) 160 MG tablet.    Telephone note sent to request refill for above medication to be sent to [facility].  Pt understands she will not get any more refills until she comes to appointment 11/27/2014.

## 2014-10-31 NOTE — Telephone Encounter (Signed)
Okay to refill Diovan one month and one refill. No late fee, she got lost

## 2014-10-31 NOTE — Telephone Encounter (Signed)
Rever [place] [phone] [facility] [address]  [name] missed her appointment today, because she was having trouble finding [place], arrived 30 minutes late. Made her an appointment for 11/27/14. She needs a refill for her valsartan (DIOVAN) 160 MG tablet

## 2014-11-01 ENCOUNTER — Telehealth: Payer: Self-pay | Admitting: *Deleted

## 2014-11-01 NOTE — Telephone Encounter (Signed)
Per Dr. Larose Kells, do not charge no show fee for 10/31/2014 appointment

## 2014-11-01 NOTE — Telephone Encounter (Signed)
Refills sent

## 2014-11-01 NOTE — Telephone Encounter (Signed)
error 

## 2014-11-09 ENCOUNTER — Other Ambulatory Visit: Payer: Self-pay

## 2014-11-26 ENCOUNTER — Other Ambulatory Visit: Payer: Self-pay | Admitting: Internal Medicine

## 2014-11-27 ENCOUNTER — Ambulatory Visit (INDEPENDENT_AMBULATORY_CARE_PROVIDER_SITE_OTHER): Payer: Commercial Managed Care - HMO | Admitting: Internal Medicine

## 2014-11-27 ENCOUNTER — Other Ambulatory Visit: Payer: Self-pay

## 2014-11-27 ENCOUNTER — Encounter: Payer: Self-pay | Admitting: Internal Medicine

## 2014-11-27 VITALS — BP 156/84 | HR 74 | Temp 97.8°F | Ht 65.0 in | Wt 154.4 lb

## 2014-11-27 DIAGNOSIS — I10 Essential (primary) hypertension: Secondary | ICD-10-CM

## 2014-11-27 MED ORDER — VALSARTAN 160 MG PO TABS: ORAL_TABLET | ORAL | Status: DC

## 2014-11-27 MED ORDER — AMLODIPINE BESYLATE 10 MG PO TABS: ORAL_TABLET | ORAL | Status: DC

## 2014-11-27 NOTE — Assessment & Plan Note (Addendum)
BP today slightly elevated but ambulatory BPs normal. Continue with Diovan and amlodipine, refills provided. Recommend to check ambulatory BPs Check a BMP

## 2014-11-27 NOTE — Patient Instructions (Signed)
Get your blood work before you leave   Check the  blood pressure  weekly  Be sure your blood pressure is between  145/85  and 110/65.  if it is consistently higher or lower, let me know   Please come back to the office in 3-4 months  for a physical exam. Come back fasting

## 2014-11-27 NOTE — Progress Notes (Signed)
Subjective:    Patient ID: [name], female    DOB: 1935/11/04, 80 y.o.   MRN: [id number]  DOS:  11/27/2014 Type of visit - description : rov Interval history: In general feeling well, no major concerns. BP today elevated but ambulatory BPs 120, 140 on average. Labs reviewed, due for a BMP. She sees endocrinology regularly for follow-up thyroid disease. Declined a flu shot  ROS Denies chest pain or difficulty breathing No nausea, vomiting, diarrhea.  Past Medical History  Diagnosis Date  . Hypertension   . Hyperthyroidism     s/p ablation, Dr Debbora Presto  . Diverticulosis   . Anemia   . Gout     h/o   . CRI (chronic renal insufficiency)     elevated creatinine 4-11: stage 3 CKD, sees nephrology, history of solitary   . Glaucoma   . Abnormal MRI of abdomen     ABNORMAL CT and MRI of pancreas, last MRI 1-09: after d/w GI we rec. to f/u clinically and redo XRs if problems      Past Surgical History  Procedure Laterality Date  . Cataract extraction w/ intraocular lens  implant, bilateral  2006  . Colonoscopy    . Orif tibia plateau  10/24/2012    Procedure: OPEN REDUCTION INTERNAL FIXATION (ORIF) TIBIAL PLATEAU;  Surgeon: Jessy Oto, MD;  Location: [facility];  Service: Orthopedics;  Laterality: Left;    History   Social History  . Marital Status: Legally Separated    Spouse Name: N/A    Number of Children: 1  . Years of Education: N/A   Occupational History  . retired     Social History Main Topics  . Smoking status: Never Smoker   . Smokeless tobacco: Never Used  . Alcohol Use: No  . Drug Use: No  . Sexual Activity: Not on file   Other Topics Concern  . Not on file   Social History Narrative   Divorced, lives with her sister     Has one daughter who lives here in town        Medication List       This list is accurate as of: 11/27/14 11:59 PM.  Always use your most recent med list.               amLODipine 10 MG tablet  Commonly known as:   NORVASC  Take 1 tablet daily.     aspirin 81 MG tablet  Take 81 mg by mouth daily.     Colchicine 0.6 MG Caps  Take 2 capsules by mouth.  Then take 1 capsule by mouth 1 hour later.     latanoprost 0.005 % ophthalmic solution  Commonly known as:  XALATAN  1 drop at bedtime.     levothyroxine 75 MCG tablet  Commonly known as:  SYNTHROID, LEVOTHROID  Take 75 mcg by mouth daily before breakfast.     valsartan 160 MG tablet  Commonly known as:  DIOVAN  Take 1 tablet daily.           Objective:   Physical Exam BP 156/84 mmHg  Pulse 74  Temp(Src) 97.8 F (36.6 C) (Oral)  Ht 5\' 5"  (1.651 m)  Wt 154 lb 6 oz (70.024 kg)  BMI 25.69 kg/m2  SpO2 99% General -- alert, well-developed, NAD.   Lungs -- normal respiratory effort, no intercostal retractions, no accessory muscle use, and normal breath sounds.  Heart-- normal rate, regular rhythm, no murmur.  Extremities--  no pretibial edema bilaterally  Neurologic--  alert & oriented X3. Speech normal, gait appropriate for age, strength symmetric and appropriate for age.    Psych-- Cognition and judgment appear intact. Cooperative with normal attention span and concentration. No anxious or depressed appearing.        Assessment & Plan:

## 2014-11-27 NOTE — Progress Notes (Signed)
Pre visit review using our clinic review tool, if applicable. No additional management support is needed unless otherwise documented below in the visit note. 

## 2014-11-28 LAB — BASIC METABOLIC PANEL
BUN: 23 mg/dL (ref 6–23)
CALCIUM: 10 mg/dL (ref 8.4–10.5)
CO2: 21 mEq/L (ref 19–32)
CREATININE: 2 mg/dL — AB (ref 0.4–1.2)
Chloride: 114 mEq/L — ABNORMAL HIGH (ref 96–112)
GFR: 30.84 mL/min — ABNORMAL LOW (ref >60.00)
GLUCOSE: 69 mg/dL — AB (ref 70–99)
Potassium: 5.1 mEq/L (ref 3.5–5.1)
SODIUM: 145 meq/L (ref 135–145)

## 2014-11-30 NOTE — Addendum Note (Signed)
Addended by: Wilfrid Lund on: 11/30/2014 02:35 PM   Modules accepted: Orders

## 2014-12-23 ENCOUNTER — Telehealth: Payer: Self-pay | Admitting: Internal Medicine

## 2014-12-23 NOTE — Telephone Encounter (Signed)
Advise patient, due for a BMP, dx hypertension. Please arrange

## 2014-12-25 NOTE — Telephone Encounter (Signed)
Letter printed and mailed to Pt. BMP previously ordered.

## 2015-01-15 ENCOUNTER — Other Ambulatory Visit (INDEPENDENT_AMBULATORY_CARE_PROVIDER_SITE_OTHER): Payer: Commercial Managed Care - HMO

## 2015-01-15 DIAGNOSIS — I10 Essential (primary) hypertension: Secondary | ICD-10-CM

## 2015-01-16 LAB — BASIC METABOLIC PANEL
BUN: 22 mg/dL (ref 6–23)
CO2: 20 meq/L (ref 19–32)
Calcium: 9.9 mg/dL (ref 8.4–10.5)
Chloride: 107 mEq/L (ref 96–112)
Creatinine, Ser: 1.73 mg/dL — ABNORMAL HIGH (ref 0.40–1.20)
GFR: 36.65 mL/min — AB (ref >60.00)
GLUCOSE: 94 mg/dL (ref 70–99)
Potassium: 4.2 mEq/L (ref 3.5–5.1)
Sodium: 137 mEq/L (ref 135–145)

## 2015-02-26 ENCOUNTER — Telehealth: Payer: Self-pay | Admitting: Internal Medicine

## 2015-02-26 NOTE — Telephone Encounter (Signed)
Valsartan/Diovan refills sent 11/27/2014 # 90 tablets and 3 refills to [facility] [address].

## 2015-02-26 NOTE — Telephone Encounter (Signed)
Caller name:Talcott Kynslee Relation to ZO:XWRU Call back number:[phone] [facility]  Reason for call: pt is needing rx   valsartan (DIOVAN) 160 MG tablet     Pt has appt on 03/30/15 for her CPE, states she only has 2 pills left

## 2015-03-12 ENCOUNTER — Telehealth: Payer: Self-pay | Admitting: Internal Medicine

## 2015-03-12 NOTE — Telephone Encounter (Signed)
Pre visit letter sent  °

## 2015-03-28 ENCOUNTER — Telehealth: Payer: Self-pay

## 2015-03-28 NOTE — Telephone Encounter (Signed)
Pre visit call completed 

## 2015-03-30 ENCOUNTER — Encounter: Payer: Self-pay | Admitting: Internal Medicine

## 2015-03-30 ENCOUNTER — Ambulatory Visit (INDEPENDENT_AMBULATORY_CARE_PROVIDER_SITE_OTHER): Payer: Commercial Managed Care - HMO | Admitting: Internal Medicine

## 2015-03-30 VITALS — BP 130/88 | HR 73 | Temp 97.6°F | Ht 65.0 in | Wt 150.2 lb

## 2015-03-30 DIAGNOSIS — Z0000 Encounter for general adult medical examination without abnormal findings: Secondary | ICD-10-CM

## 2015-03-30 DIAGNOSIS — N183 Chronic kidney disease, stage 3 unspecified: Secondary | ICD-10-CM

## 2015-03-30 DIAGNOSIS — I10 Essential (primary) hypertension: Secondary | ICD-10-CM | POA: Diagnosis not present

## 2015-03-30 DIAGNOSIS — Z23 Encounter for immunization: Secondary | ICD-10-CM

## 2015-03-30 DIAGNOSIS — R935 Abnormal findings on diagnostic imaging of other abdominal regions, including retroperitoneum: Secondary | ICD-10-CM

## 2015-03-30 DIAGNOSIS — D649 Anemia, unspecified: Secondary | ICD-10-CM | POA: Diagnosis not present

## 2015-03-30 LAB — BASIC METABOLIC PANEL
BUN: 19 mg/dL (ref 6–23)
CO2: 28 mEq/L (ref 19–32)
Calcium: 10 mg/dL (ref 8.4–10.5)
Chloride: 104 mEq/L (ref 96–112)
Creatinine, Ser: 1.73 mg/dL — ABNORMAL HIGH (ref 0.40–1.20)
GFR: 36.64 mL/min — ABNORMAL LOW (ref >60.00)
GLUCOSE: 74 mg/dL (ref 70–99)
Potassium: 4.6 mEq/L (ref 3.5–5.1)
Sodium: 136 mEq/L (ref 135–145)

## 2015-03-30 LAB — CBC WITH DIFFERENTIAL/PLATELET
Basophils Absolute: 0.1 10*3/uL (ref 0.0–0.1)
Basophils Relative: 1.2 % (ref 0.0–3.0)
EOS ABS: 0.2 10*3/uL (ref 0.0–0.7)
Eosinophils Relative: 3.5 % (ref 0.0–5.0)
HEMATOCRIT: 35.1 % — AB (ref 36.0–46.0)
Hemoglobin: 11.6 g/dL — ABNORMAL LOW (ref 12.0–15.0)
LYMPHS ABS: 1.8 10*3/uL (ref 0.7–4.0)
Lymphocytes Relative: 30.8 % (ref 12.0–46.0)
MCHC: 33 g/dL (ref 30.0–36.0)
MCV: 87.8 fl (ref 78.0–100.0)
MONO ABS: 0.3 10*3/uL (ref 0.1–1.0)
Monocytes Relative: 6 % (ref 3.0–12.0)
NEUTROS PCT: 58.5 % (ref 43.0–77.0)
Neutro Abs: 3.4 10*3/uL (ref 1.4–7.7)
Platelets: 404 10*3/uL — ABNORMAL HIGH (ref 150.0–400.0)
RBC: 4 Mil/uL (ref 3.87–5.11)
RDW: 16.8 % — ABNORMAL HIGH (ref 11.5–15.5)
WBC: 5.7 10*3/uL (ref 4.0–10.5)

## 2015-03-30 LAB — FERRITIN: Ferritin: 42.3 ng/mL (ref 10.0–291.0)

## 2015-03-30 LAB — IRON: Iron: 71 ug/dL (ref 42–145)

## 2015-03-30 NOTE — Assessment & Plan Note (Addendum)
Check a BMP today Last visit w/ renal 2014, f/u is prn rec to avoid NSAIDs

## 2015-03-30 NOTE — Assessment & Plan Note (Addendum)
Td--2015 Pneumonia shot-- 2015 prevnar-- today declined flu, shingles shot    cscope  2008, no polyps cscope again 08/2012--Dr Perry--adenomatous polyps--due 2018  No further PAPs, see previous entries  07-2012 MMG (-) ,  no family history of breast cancer, discussed further screening-not interested.  family history coronary artery disease, good compliance w/  aspirin 81 mg daily  Reports a DEXA x 1 ~ 2014?  (normal) @ Dr Chalmers Cater , rec to discuss w/ her next DEXA  Counseled about diet and  exercise

## 2015-03-30 NOTE — Patient Instructions (Addendum)
Get your blood work before you leave    Check the  blood pressure 2 or 3 times a  Week   Be sure your blood pressure is between 110/65 and  130/80  if it is consistently higher or lower, let me know      Come back to the office in 1 year   for a physical exam  Please schedule an appointment at the front desk    Come back fasting       Fall Prevention and Champion cause injuries and can affect all age groups. It is possible to use preventive measures to significantly decrease the likelihood of falls. There are many simple measures which can make your home safer and prevent falls. OUTDOORS  Repair cracks and edges of walkways and driveways.  Remove high doorway thresholds.  Trim shrubbery on the main path into your home.  Have good outside lighting.  Clear walkways of tools, rocks, debris, and clutter.  Check that handrails are not broken and are securely fastened. Both sides of steps should have handrails.  Have leaves, snow, and ice cleared regularly.  Use sand or salt on walkways during winter months.  In the garage, clean up grease or oil spills. BATHROOM  Install night lights.  Install grab bars by the toilet and in the tub and shower.  Use non-skid mats or decals in the tub or shower.  Place a plastic non-slip stool in the shower to sit on, if needed.  Keep floors dry and clean up all water on the floor immediately.  Remove soap buildup in the tub or shower on a regular basis.  Secure bath mats with non-slip, double-sided rug tape.  Remove throw rugs and tripping hazards from the floors. BEDROOMS  Install night lights.  Make sure a bedside light is easy to reach.  Do not use oversized bedding.  Keep a telephone by your bedside.  Have a firm chair with side arms to use for getting dressed.  Remove throw rugs and tripping hazards from the floor. KITCHEN  Keep handles on pots and pans turned toward the center of the stove. Use back burners  when possible.  Clean up spills quickly and allow time for drying.  Avoid walking on wet floors.  Avoid hot utensils and knives.  Position shelves so they are not too high or low.  Place commonly used objects within easy reach.  If necessary, use a sturdy step stool with a grab bar when reaching.  Keep electrical cables out of the way.  Do not use floor polish or wax that makes floors slippery. If you must use wax, use non-skid floor wax.  Remove throw rugs and tripping hazards from the floor. STAIRWAYS  Never leave objects on stairs.  Place handrails on both sides of stairways and use them. Fix any loose handrails. Make sure handrails on both sides of the stairways are as [place] as the stairs.  Check carpeting to make sure it is firmly attached along stairs. Make repairs to worn or loose carpet promptly.  Avoid placing throw rugs at the top or bottom of stairways, or properly secure the rug with carpet tape to prevent slippage. Get rid of throw rugs, if possible.  Have an electrician put in a light switch at the top and bottom of the stairs. OTHER FALL PREVENTION TIPS  Wear low-heel or rubber-soled shoes that are supportive and fit well. Wear closed toe shoes.  When using a stepladder, make sure it is  fully opened and both spreaders are firmly locked. Do not climb a closed stepladder.  Add color or contrast paint or tape to grab bars and handrails in your home. Place contrasting color strips on first and last steps.  Learn and use mobility aids as needed. Install an electrical emergency response system.  Turn on lights to avoid dark areas. Replace light bulbs that burn out immediately. Get light switches that glow.  Arrange furniture to create clear pathways. Keep furniture in the same place.  Firmly attach carpet with non-skid or double-sided tape.  Eliminate uneven floor surfaces.  Select a carpet pattern that does not visually hide the edge of steps.  Be aware of  all pets. OTHER HOME SAFETY TIPS  Set the water temperature for 120 F (48.8 C).  Keep emergency numbers on or near the telephone.  Keep smoke detectors on every level of the home and near sleeping areas. Document Released: 10/31/2002 Document Revised: 05/11/2012 Document Reviewed: 01/30/2012 [facility] Patient Information 2015 [place], [place]. This information is not intended to replace advice given to you by your health care provider. Make sure you discuss any questions you have with your health care provider.   Preventive Care for Adults Ages 43 and over  Blood pressure check.** / Every 1 to 2 years.  Lipid and cholesterol check.**/ Every 5 years beginning at age 76.  Lung cancer screening. / Every year if you are aged 103-80 years and have a 30-pack-year history of smoking and currently smoke or have quit within the past 15 years. Yearly screening is stopped once you have quit smoking for at least 15 years or develop a health problem that would prevent you from having lung cancer treatment.  Fecal occult blood test (FOBT) of stool. / Every year beginning at age 40 and continuing until age 31. You may not have to do this test if you get a colonoscopy every 10 years.  Flexible sigmoidoscopy** or colonoscopy.** / Every 5 years for a flexible sigmoidoscopy or every 10 years for a colonoscopy beginning at age 76 and continuing until age 64.  Hepatitis C blood test.** / For all people born from [birth year] through 1963 and any individual with known risks for hepatitis C.  Abdominal aortic aneurysm (AAA) screening.** / A one-time screening for ages 71 to 83 years who are current or former smokers.  Skin self-exam. / Monthly.  Influenza vaccine. / Every year.  Tetanus, diphtheria, and acellular pertussis (Tdap/Td) vaccine.** / 1 dose of Td every 10 years.  Varicella vaccine.** / Consult your health care provider.  Zoster vaccine.** / 1 dose for adults aged 4 years or older.  Pneumococcal  13-valent conjugate (PCV13) vaccine.** / Consult your health care provider.  Pneumococcal polysaccharide (PPSV23) vaccine.** / 1 dose for all adults aged 11 years and older.  Meningococcal vaccine.** / Consult your health care provider.  Hepatitis A vaccine.** / Consult your health care provider.  Hepatitis B vaccine.** / Consult your health care provider.  Haemophilus influenzae type b (Hib) vaccine.** / Consult your health care provider. **Family history and personal history of risk and conditions may change your health care provider's recommendations. Document Released: 01/06/2002 Document Revised: 11/15/2013 Document Reviewed: 04/07/2011 [facility] Patient Information 2015 [place], [place]. This information is not intended to replace advice given to you by your health care provider. Make sure you discuss any questions you have with your health care provider.

## 2015-03-30 NOTE — Assessment & Plan Note (Addendum)
Comparison medication, given CRI, goal is 130/80 ekg nsr

## 2015-03-30 NOTE — Assessment & Plan Note (Signed)
Brensinger history of anemia, no iron deficiency. Check a CBC, iron and ferritin ; likely due to mild renal insufficiency

## 2015-03-30 NOTE — Progress Notes (Signed)
Pre visit review using our clinic review tool, if applicable. No additional management support is needed unless otherwise documented below in the visit note. 

## 2015-03-30 NOTE — Progress Notes (Signed)
Subjective:    Patient ID: [name], female    DOB: [birth year], 80 y.o.   MRN: [id number]  DOS:  03/30/2015 Type of visit - description :    Here for Medicare AWV: 1. Risk factors based on Past M, S, F history: reviewed 2. Physical Activities: walks at the [facility] x 3 /weeks, active at home  3. Depression/mood: No problems noted or reported 4. Hearing: No problems noted or reported 5. ADL's: Independent, still drives  6. Fall Risk: tibia fx 10-2012 , fall prevention discussed , see AVS 7. home Safety: does feel safe at home  8. Height, weight, &visual acuity: see VS, no vision problems , sees Dr Herbert Deaner regularly , dx w/ glaucoma  9. Counseling: provided 10. Labs ordered based on risk factors: if needed  11. Referral Coordination: if needed 12. Care Plan, see assessment and plan , written instructions provided , see AVS 13. Cognitive Assessment: Cognition and motor skills appropriate for age 24. End of life care discussed   In addition, today we discussed the following: Hypertension, good compliance of medications, she rarely checks her BPs, usually within normal, the highest BP she has seen one time was 140/77 Hypothyroidism, follow-up by Dr. Chalmers Cater  Review of Systems Constitutional: No fever, chills. No unexplained wt changes. No unusual sweats HEENT: No dental problems, ear discharge, facial swelling, voice changes. No eye discharge, redness or intolerance to light Respiratory: No wheezing or difficulty breathing. No cough , mucus production Cardiovascular: No CP, leg swelling or palpitations GI: no nausea, vomiting, diarrhea or abdominal pain.  No blood in the stools. No dysphagia   Endocrine: No polyphagia, polyuria or polydipsia GU: No dysuria, gross hematuria, difficulty urinating. No urinary urgency or frequency. Musculoskeletal: No joint swellings or unusual aches or pains Skin: No change in the color of the skin, palor or rash Allergic, immunologic: No  environmental allergies or food allergies Neurological: No dizziness or syncope. No headaches. No diplopia, slurred speech, motor deficits, facial numbness Hematological: No enlarged lymph nodes, easy bruising or bleeding Psychiatry: No suicidal ideas, hallucinations, behavior problems or confusion. No unusual/severe anxiety or depression.     Past Medical History  Diagnosis Date  . Hypertension   . Hyperthyroidism     s/p ablation, Dr Debbora Presto  . Diverticulosis   . Anemia   . Gout     h/o   . CRI (chronic renal insufficiency)     elevated creatinine 4-11: stage 3 CKD, sees nephrology, history of solitary   . Glaucoma   . Abnormal MRI of abdomen     ABNORMAL CT and MRI of pancreas, last MRI 1-09: after d/w GI we rec. to f/u clinically and redo XRs if problems      Past Surgical History  Procedure Laterality Date  . Cataract extraction w/ intraocular lens  implant, bilateral  2006  . Colonoscopy    . Orif tibia plateau  10/24/2012    Procedure: OPEN REDUCTION INTERNAL FIXATION (ORIF) TIBIAL PLATEAU;  Surgeon: Jessy Oto, MD;  Location: [facility];  Service: Orthopedics;  Laterality: Left;    History   Social History  . Marital Status: Legally Separated    Spouse Name: N/A  . Number of Children: 1  . Years of Education: N/A   Occupational History  . retired     Social History Main Topics  . Smoking status: Never Smoker   . Smokeless tobacco: Never Used  . Alcohol Use: No  . Drug Use: No  .  Sexual Activity: Not on file   Other Topics Concern  . Not on file   Social History Narrative   Divorced, lives with her sister     Has one daughter who lives here in town     Family History  Problem Relation Age of Onset  . Diabetes Mother   . Heart attack Sister   . Colon cancer Sister     colon  . Heart attack Brother   . Pancreatic cancer Brother   . Esophageal cancer Neg Hx   . Stomach cancer Neg Hx   . Rectal cancer Neg Hx   . Breast cancer Neg Hx          Medication List       This list is accurate as of: 03/30/15 11:59 PM.  Always use your most recent med list.               amLODipine 10 MG tablet  Commonly known as:  NORVASC  Take 1 tablet daily.     aspirin 81 MG tablet  Take 81 mg by mouth daily.     latanoprost 0.005 % ophthalmic solution  Commonly known as:  XALATAN  1 drop at bedtime.     levothyroxine 75 MCG tablet  Commonly known as:  SYNTHROID, LEVOTHROID  Take 75 mcg by mouth daily before breakfast.     valsartan 160 MG tablet  Commonly known as:  DIOVAN  Take 1 tablet daily.           Objective:   Physical Exam BP 130/88 mmHg  Pulse 73  Temp(Src) 97.6 F (36.4 C) (Oral)  Ht 5\' 5"  (1.651 m)  Wt 150 lb 4 oz (68.153 kg)  BMI 25.00 kg/m2  SpO2 98% General:   Well developed, well nourished . NAD.  Neck:  Full range of motion. Supple. No  thyromegaly , normal carotid pulse HEENT:  Normocephalic . Face symmetric, atraumatic Lungs:  CTA B Normal respiratory effort, no intercostal retractions, no accessory muscle use. Heart: RRR,  no murmur.  No pretibial edema bilaterally  Abdomen:  Not distended, soft, non-tender. No rebound or rigidity. No mass,organomegaly Skin: Exposed areas without rash. Not pale. Not jaundice Neurologic:  alert & oriented X3.  Speech normal, gait appropriate for age and unassisted Strength symmetric and appropriate for age.  Psych: Cognition and judgment appear intact.  Cooperative with normal attention span and concentration.  Behavior appropriate. No anxious or depressed appearing.        Assessment & Plan:

## 2015-04-05 ENCOUNTER — Encounter: Payer: Self-pay | Admitting: Internal Medicine

## 2015-04-05 ENCOUNTER — Telehealth: Payer: Self-pay | Admitting: Internal Medicine

## 2015-04-05 DIAGNOSIS — Z0100 Encounter for examination of eyes and vision without abnormal findings: Secondary | ICD-10-CM

## 2015-04-05 NOTE — Telephone Encounter (Signed)
Referral placed.

## 2015-04-05 NOTE — Telephone Encounter (Signed)
Relation to pt: self  Call back number: [phone]   Reason for call:  Pt requesting a referral for [facility]: Judith Sheriff MD for glaucoma check.

## 2015-06-07 DIAGNOSIS — H43393 Other vitreous opacities, bilateral: Secondary | ICD-10-CM | POA: Diagnosis not present

## 2015-06-07 DIAGNOSIS — Z961 Presence of intraocular lens: Secondary | ICD-10-CM | POA: Diagnosis not present

## 2015-06-07 DIAGNOSIS — H402230 Chronic angle-closure glaucoma, bilateral, stage unspecified: Secondary | ICD-10-CM | POA: Diagnosis not present

## 2015-06-07 DIAGNOSIS — H35033 Hypertensive retinopathy, bilateral: Secondary | ICD-10-CM | POA: Diagnosis not present

## 2015-08-09 ENCOUNTER — Telehealth: Payer: Self-pay | Admitting: Internal Medicine

## 2015-08-09 DIAGNOSIS — E0590 Thyrotoxicosis, unspecified without thyrotoxic crisis or storm: Secondary | ICD-10-CM

## 2015-08-09 NOTE — Telephone Encounter (Signed)
[facility] - Endo - Dr. Chalmers Cater Thyroid follow up on Tuesday 08/14/15 11:00am Pt insurance requires referral.

## 2015-08-09 NOTE — Telephone Encounter (Signed)
Referral placed.

## 2015-08-14 DIAGNOSIS — I10 Essential (primary) hypertension: Secondary | ICD-10-CM | POA: Diagnosis not present

## 2015-08-14 DIAGNOSIS — E890 Postprocedural hypothyroidism: Secondary | ICD-10-CM | POA: Diagnosis not present

## 2015-08-14 DIAGNOSIS — M810 Age-related osteoporosis without current pathological fracture: Secondary | ICD-10-CM | POA: Diagnosis not present

## 2015-09-18 DIAGNOSIS — H402230 Chronic angle-closure glaucoma, bilateral, stage unspecified: Secondary | ICD-10-CM | POA: Diagnosis not present

## 2015-12-07 ENCOUNTER — Other Ambulatory Visit: Payer: Self-pay

## 2015-12-07 MED ORDER — VALSARTAN 160 MG PO TABS: 160.0000 mg | ORAL_TABLET | Freq: Every day | ORAL | Status: DC

## 2015-12-07 MED ORDER — AMLODIPINE BESYLATE 10 MG PO TABS: 10.0000 mg | ORAL_TABLET | Freq: Every day | ORAL | Status: DC

## 2015-12-20 ENCOUNTER — Other Ambulatory Visit: Payer: Self-pay | Admitting: Internal Medicine

## 2015-12-25 ENCOUNTER — Telehealth: Payer: Self-pay | Admitting: Internal Medicine

## 2015-12-25 DIAGNOSIS — H409 Unspecified glaucoma: Secondary | ICD-10-CM

## 2015-12-25 NOTE — Telephone Encounter (Signed)
Referral placed.

## 2015-12-25 NOTE — Telephone Encounter (Signed)
Pt needing referral to [facility], Call us: [phone] [place], [place] [address] Dr. Marshall Cork  She has appt for regular exam 01/03/16 12:45pm.

## 2016-01-03 DIAGNOSIS — H402230 Chronic angle-closure glaucoma, bilateral, stage unspecified: Secondary | ICD-10-CM | POA: Diagnosis not present

## 2016-02-17 ENCOUNTER — Other Ambulatory Visit: Payer: Self-pay | Admitting: Internal Medicine

## 2016-02-19 ENCOUNTER — Telehealth: Payer: Self-pay | Admitting: Internal Medicine

## 2016-02-19 MED ORDER — VALSARTAN 160 MG PO TABS: 160.0000 mg | ORAL_TABLET | Freq: Every day | ORAL | Status: DC

## 2016-02-19 NOTE — Telephone Encounter (Signed)
Caller name: Self  Can be reached: [phone]   Pharmacy:  [facility] [place], [place] [phone] (Phone) [phone] (Fax)         Reason for call: Request rx for valsartan (DIOVAN) 160 MG tablet [id number] be sent to above pharmacy

## 2016-02-19 NOTE — Telephone Encounter (Signed)
[facility], was refilled on 02/18/2016 #30 and 2 refills. However, will send to [place], #90 and 0RF.

## 2016-03-31 ENCOUNTER — Telehealth: Payer: Self-pay | Admitting: *Deleted

## 2016-03-31 NOTE — Telephone Encounter (Signed)
Pt not available at time of call. Appt confirmed.

## 2016-04-01 ENCOUNTER — Ambulatory Visit (INDEPENDENT_AMBULATORY_CARE_PROVIDER_SITE_OTHER): Payer: Commercial Managed Care - HMO | Admitting: Internal Medicine

## 2016-04-01 ENCOUNTER — Encounter: Payer: Self-pay | Admitting: Internal Medicine

## 2016-04-01 VITALS — BP 124/62 | HR 67 | Temp 97.5°F | Ht 65.0 in | Wt 153.1 lb

## 2016-04-01 DIAGNOSIS — I10 Essential (primary) hypertension: Secondary | ICD-10-CM | POA: Diagnosis not present

## 2016-04-01 DIAGNOSIS — Z09 Encounter for follow-up examination after completed treatment for conditions other than malignant neoplasm: Secondary | ICD-10-CM

## 2016-04-01 DIAGNOSIS — Z8781 Personal history of (healed) traumatic fracture: Secondary | ICD-10-CM

## 2016-04-01 DIAGNOSIS — Z0000 Encounter for general adult medical examination without abnormal findings: Secondary | ICD-10-CM

## 2016-04-01 DIAGNOSIS — R590 Localized enlarged lymph nodes: Secondary | ICD-10-CM | POA: Diagnosis not present

## 2016-04-01 DIAGNOSIS — N189 Chronic kidney disease, unspecified: Secondary | ICD-10-CM | POA: Diagnosis not present

## 2016-04-01 DIAGNOSIS — E039 Hypothyroidism, unspecified: Secondary | ICD-10-CM

## 2016-04-01 LAB — COMPREHENSIVE METABOLIC PANEL
ALBUMIN: 4 g/dL (ref 3.5–5.2)
ALK PHOS: 76 U/L (ref 39–117)
ALT: 9 U/L (ref 0–35)
AST: 14 U/L (ref 0–37)
BILIRUBIN TOTAL: 0.4 mg/dL (ref 0.2–1.2)
BUN: 21 mg/dL (ref 6–23)
CO2: 26 mEq/L (ref 19–32)
Calcium: 9.9 mg/dL (ref 8.4–10.5)
Chloride: 104 mEq/L (ref 96–112)
Creatinine, Ser: 1.81 mg/dL — ABNORMAL HIGH (ref 0.40–1.20)
GFR: 34.68 mL/min — ABNORMAL LOW (ref >60.00)
GLUCOSE: 84 mg/dL (ref 70–99)
Potassium: 4.9 mEq/L (ref 3.5–5.1)
SODIUM: 138 meq/L (ref 135–145)
TOTAL PROTEIN: 8.2 g/dL (ref 6.0–8.3)

## 2016-04-01 LAB — CBC WITH DIFFERENTIAL/PLATELET
BASOS PCT: 1 % (ref 0.0–3.0)
Basophils Absolute: 0.1 10*3/uL (ref 0.0–0.1)
EOS PCT: 2.8 % (ref 0.0–5.0)
Eosinophils Absolute: 0.2 10*3/uL (ref 0.0–0.7)
HEMATOCRIT: 34.1 % — AB (ref 36.0–46.0)
HEMOGLOBIN: 11.2 g/dL — AB (ref 12.0–15.0)
LYMPHS PCT: 31.1 % (ref 12.0–46.0)
Lymphs Abs: 1.7 10*3/uL (ref 0.7–4.0)
MCHC: 32.9 g/dL (ref 30.0–36.0)
MCV: 90.8 fl (ref 78.0–100.0)
MONOS PCT: 6.4 % (ref 3.0–12.0)
Monocytes Absolute: 0.4 10*3/uL (ref 0.1–1.0)
Neutro Abs: 3.2 10*3/uL (ref 1.4–7.7)
Neutrophils Relative %: 58.7 % (ref 43.0–77.0)
Platelets: 384 10*3/uL (ref 150.0–400.0)
RBC: 3.75 Mil/uL — AB (ref 3.87–5.11)
RDW: 16.1 % — ABNORMAL HIGH (ref 11.5–15.5)
WBC: 5.5 10*3/uL (ref 4.0–10.5)

## 2016-04-01 LAB — LIPID PANEL
Cholesterol: 134 mg/dL (ref 0–200)
HDL: 59.6 mg/dL (ref >39.00)
LDL Cholesterol: 62 mg/dL (ref 0–99)
NONHDL: 74.73
Total CHOL/HDL Ratio: 2
Triglycerides: 63 mg/dL (ref 0.0–149.0)
VLDL: 12.6 mg/dL (ref 0.0–40.0)

## 2016-04-01 LAB — TSH: TSH: 27.52 u[IU]/mL — AB (ref 0.35–4.50)

## 2016-04-01 NOTE — Progress Notes (Signed)
Subjective:    Patient ID: [name], female    DOB: [birth year], 81 y.o.   MRN: [id number]  DOS:  04/01/2016 Type of visit - description :  Here for Medicare AWV: 1. Risk factors based on Past M, S, F history: reviewed 2. Physical Activities:  walks at the [facility] x 3 /weeks, active at home   3. Depression/mood: No problems noted or reported 4. Hearing:  No problems noted or reported 5. ADL's:  Independent, still drives   6. Fall Risk: tibia fx 10-2012 , no recent falls,  prevention discussed  , see AVS 7. home Safety: does feel safe at home   8. Height, weight, &visual acuity: see VS, no vision problems , sees Dr Herbert Deaner regularly , dx w/ glaucoma     9. Counseling: provided 10. Labs ordered based on risk factors: if needed   11. Referral Coordination: if needed 12.  Care Plan, see assessment and plan  , written instructions provided , see AVS 13.   Cognitive Assessment: Cognition and motor skills appropriate for age 41. Care team updated  15. End of life care discussed , rec to get a HC POA, MOST  form provided   We also discussed the following: HTN: Good compliance of medication, BP today is very good, she hardly ever checks her BP at home Hypothyroidism: Does not plan to see endocrinology, due for labs   Review of Systems Constitutional: No fever. No chills. No unexplained wt changes. No unusual sweats  HEENT: No dental problems, no ear discharge, no facial swelling, no voice changes. No eye discharge, no eye  redness , no  intolerance to light   Respiratory: No wheezing , no  difficulty breathing. No cough , no mucus production  Cardiovascular: No CP, no leg swelling , no  Palpitations  GI: no nausea, no vomiting, no diarrhea , no  abdominal pain.  No blood in the stools. No dysphagia, no odynophagia    Endocrine: No polyphagia, no polyuria , no polydipsia  GU: No dysuria, gross hematuria, difficulty urinating. No urinary urgency, no frequency.  Musculoskeletal: No  joint swellings or unusual aches or pains  Skin: No change in the color of the skin, palor , no  Rash  Allergic, immunologic: No environmental allergies , no  food allergies  Neurological: No dizziness no  syncope. No headaches. No diplopia, no slurred, no slurred speech, no motor deficits, no facial  Numbness  Hematological: No enlarged lymph nodes, no easy bruising , no unusual bleedings  Psychiatry: No suicidal ideas, no hallucinations, no beavior problems, no confusion.  No unusual/severe anxiety, no depression   Past Medical History  Diagnosis Date  . Hypertension   . Hyperthyroidism     s/p ablation, Dr Debbora Presto  . Diverticulosis   . Anemia   . Gout     h/o   . CRI (chronic renal insufficiency)     elevated creatinine 4-11: stage 3 CKD, sees nephrology, history of solitary   . Glaucoma   . Abnormal MRI of abdomen     ABNORMAL CT and MRI of pancreas, last MRI 1-09: after d/w GI we rec. to f/u clinically and redo XRs if problems      Past Surgical History  Procedure Laterality Date  . Cataract extraction w/ intraocular lens  implant, bilateral  2006  . Colonoscopy    . Orif tibia plateau  10/24/2012    Procedure: OPEN REDUCTION INTERNAL FIXATION (ORIF) TIBIAL PLATEAU;  Surgeon: Jessy Oto,  MD;  Location: [facility];  Service: Orthopedics;  Laterality: Left;    Social History   Social History  . Marital Status: Legally Separated    Spouse Name: N/A  . Number of Children: 1  . Years of Education: N/A   Occupational History  . retired     Social History Main Topics  . Smoking status: Never Smoker   . Smokeless tobacco: Never Used  . Alcohol Use: No  . Drug Use: No  . Sexual Activity: Not on file   Other Topics Concern  . Not on file   Social History Narrative   Divorced, lives with her sister     Has one daughter who lives here in town     Family History  Problem Relation Age of Onset  . Diabetes Mother   . Heart attack Sister   . Colon cancer Sister       colon  . Heart attack Brother   . Pancreatic cancer Brother   . Esophageal cancer Neg Hx   . Stomach cancer Neg Hx   . Rectal cancer Neg Hx   . Breast cancer Neg Hx       Medication List       This list is accurate as of: 04/01/16  4:52 PM.  Always use your most recent med list.               amLODipine 10 MG tablet  Commonly known as:  NORVASC  Take 1 tablet (10 mg total) by mouth daily.     aspirin 81 MG tablet  Take 81 mg by mouth daily.     latanoprost 0.005 % ophthalmic solution  Commonly known as:  XALATAN  1 drop at bedtime.     levothyroxine 75 MCG tablet  Commonly known as:  SYNTHROID, LEVOTHROID  Take 75 mcg by mouth daily before breakfast.     valsartan 160 MG tablet  Commonly known as:  DIOVAN  Take 1 tablet (160 mg total) by mouth daily.           Objective:   Physical Exam BP 124/62 mmHg  Pulse 67  Temp(Src) 97.5 F (36.4 C) (Oral)  Ht 5\' 5"  (1.651 m)  Wt 153 lb 2 oz (69.457 kg)  BMI 25.48 kg/m2  SpO2 98%  General:   Well developed, well nourished . NAD.  Neck: No  thyromegaly , normal carotid pulse HEENT:  Normocephalic . Face symmetric, atraumatic Lungs:  CTA B Normal respiratory effort, no intercostal retractions, no accessory muscle use. Heart: RRR,  no murmur.  No pretibial edema bilaterally  Abdomen:  Not distended, soft, non-tender. No rebound or rigidity.   Breast: no dominant mass, skin and nipples normal to inspection on palpation; Question of the LAD and the right axillary area; left side normal  Skin: Exposed areas without rash. Not pale. Not jaundice Neurologic:  alert & oriented X3.  Speech normal, gait appropriate for age and unassisted Strength symmetric and appropriate for age.  Psych: Cognition and judgment appear intact.  Cooperative with normal attention span and concentration.  Behavior appropriate. No anxious or depressed appearing.    Assessment & Plan:   Assessment HTN CKD, stage III, Single  kidney (absent L one); sees nephrology prn, last visit 2014 Hyperthyroidism, s/p ablation, Dr. Chalmers Cater, no f/u schedule  Glaucoma H/o Gout H/o abnormal MRI abdomen , last MRI 2009, saw GI ---> follow up clinically, redo MRI prn  Plan: HTN: Continue amlodipine and Diovan. Check a CMP  CKD: Does not see nephrology, reconsult if problems. Check a CBC Hypothyroidism: used to see endo, now f/u here, labs RTC 6 months

## 2016-04-01 NOTE — Assessment & Plan Note (Signed)
HTN: Continue amlodipine and Diovan. Check a CMP CKD: Does not see nephrology, reconsult if problems. Check a CBC Hypothyroidism: used to see endo, now f/u here, labs RTC 6 months

## 2016-04-01 NOTE — Assessment & Plan Note (Addendum)
Td--2015; Pneumonia shot-- 2015; prevnar-- 2016; zostavax discussed, declined     + [name] Ca cscope  2008, no polyps cscope again 08/2012--Dr Perry--adenomatous polyps--due 2018  No further PAPs, see previous entries  07-2012 MMG (-) , she agreed on further screening this year, on exam question of a right axillary LAD. Plan: Mammogram,  breast ultrasound.   + FH CAD- on  aspirin 81 mg daily   DEXA x 1 ~ 2014?  (normal) @ Dr Chalmers Cater, h/o a Fx  >> plan: Ca, vit D, DEXA    diet and  exercise discussed

## 2016-04-01 NOTE — Progress Notes (Signed)
Pre visit review using our clinic review tool, if applicable. No additional management support is needed unless otherwise documented below in the visit note. 

## 2016-04-01 NOTE — Patient Instructions (Addendum)
Get your blood work before you leave   Stay active, take calcium and vitamin D supplements daily  Will schedule a mammogram and breast ultrasound   Next visit in 6 months, please make an appointment.   Fall Prevention and Home Safety Falls cause injuries and can affect all age groups. It is possible to use preventive measures to significantly decrease the likelihood of falls. There are many simple measures which can make your home safer and prevent falls. OUTDOORS  Repair cracks and edges of walkways and driveways.  Remove high doorway thresholds.  Trim shrubbery on the main path into your home.  Have good outside lighting.  Clear walkways of tools, rocks, debris, and clutter.  Check that handrails are not broken and are securely fastened. Both sides of steps should have handrails.  Have leaves, snow, and ice cleared regularly.  Use sand or salt on walkways during winter months.  In the garage, clean up grease or oil spills. BATHROOM  Install night lights.  Install grab bars by the toilet and in the tub and shower.  Use non-skid mats or decals in the tub or shower.  Place a plastic non-slip stool in the shower to sit on, if needed.  Keep floors dry and clean up all water on the floor immediately.  Remove soap buildup in the tub or shower on a regular basis.  Secure bath mats with non-slip, double-sided rug tape.  Remove throw rugs and tripping hazards from the floors. BEDROOMS  Install night lights.  Make sure a bedside light is easy to reach.  Do not use oversized bedding.  Keep a telephone by your bedside.  Have a firm chair with side arms to use for getting dressed.  Remove throw rugs and tripping hazards from the floor. KITCHEN  Keep handles on pots and pans turned toward the center of the stove. Use back burners when possible.  Clean up spills quickly and allow time for drying.  Avoid walking on wet floors.  Avoid hot utensils and  knives.  Position shelves so they are not too high or low.  Place commonly used objects within easy reach.  If necessary, use a sturdy step stool with a grab bar when reaching.  Keep electrical cables out of the way.  Do not use floor polish or wax that makes floors slippery. If you must use wax, use non-skid floor wax.  Remove throw rugs and tripping hazards from the floor. STAIRWAYS  Never leave objects on stairs.  Place handrails on both sides of stairways and use them. Fix any loose handrails. Make sure handrails on both sides of the stairways are as Trimmer as the stairs.  Check carpeting to make sure it is firmly attached along stairs. Make repairs to worn or loose carpet promptly.  Avoid placing throw rugs at the top or bottom of stairways, or properly secure the rug with carpet tape to prevent slippage. Get rid of throw rugs, if possible.  Have an electrician put in a light switch at the top and bottom of the stairs. OTHER FALL PREVENTION TIPS  Wear low-heel or rubber-soled shoes that are supportive and fit well. Wear closed toe shoes.  When using a stepladder, make sure it is fully opened and both spreaders are firmly locked. Do not climb a closed stepladder.  Add color or contrast paint or tape to grab bars and handrails in your home. Place contrasting color strips on first and last steps.  Learn and use mobility aids as needed.  Install an electrical emergency response system.  Turn on lights to avoid dark areas. Replace light bulbs that burn out immediately. Get light switches that glow.  Arrange furniture to create clear pathways. Keep furniture in the same place.  Firmly attach carpet with non-skid or double-sided tape.  Eliminate uneven floor surfaces.  Select a carpet pattern that does not visually hide the edge of steps.  Be aware of all pets. OTHER HOME SAFETY TIPS  Set the water temperature for 120 F (48.8 C).  Keep emergency numbers on or near the  telephone.  Keep smoke detectors on every level of the home and near sleeping areas. Document Released: 10/31/2002 Document Revised: 05/11/2012 Document Reviewed: 01/30/2012 [facility] Patient Information 2015 [place], [place]. This information is not intended to replace advice given to you by your health care provider. Make sure you discuss any questions you have with your health care provider.   Preventive Care for Adults Ages 58 and over  Blood pressure check.** / Every 1 to 2 years.  Lipid and cholesterol check.**/ Every 5 years beginning at age 77.  Lung cancer screening. / Every year if you are aged 24-80 years and have a 30-pack-year history of smoking and currently smoke or have quit within the past 15 years. Yearly screening is stopped once you have quit smoking for at least 15 years or develop a health problem that would prevent you from having lung cancer treatment.  Fecal occult blood test (FOBT) of stool. / Every year beginning at age 14 and continuing until age 39. You may not have to do this test if you get a colonoscopy every 10 years.  Flexible sigmoidoscopy** or colonoscopy.** / Every 5 years for a flexible sigmoidoscopy or every 10 years for a colonoscopy beginning at age 37 and continuing until age 26.  Hepatitis C blood test.** / For all people born from [birth year] through 1963 and any individual with known risks for hepatitis C.  Abdominal aortic aneurysm (AAA) screening.** / A one-time screening for ages 25 to 39 years who are current or former smokers.  Skin self-exam. / Monthly.  Influenza vaccine. / Every year.  Tetanus, diphtheria, and acellular pertussis (Tdap/Td) vaccine.** / 1 dose of Td every 10 years.  Varicella vaccine.** / Consult your health care provider.  Zoster vaccine.** / 1 dose for adults aged 76 years or older.  Pneumococcal 13-valent conjugate (PCV13) vaccine.** / Consult your health care provider.  Pneumococcal polysaccharide (PPSV23) vaccine.**  / 1 dose for all adults aged 67 years and older.  Meningococcal vaccine.** / Consult your health care provider.  Hepatitis A vaccine.** / Consult your health care provider.  Hepatitis B vaccine.** / Consult your health care provider.  Haemophilus influenzae type b (Hib) vaccine.** / Consult your health care provider. **Family history and personal history of risk and conditions may change your health care provider's recommendations. Document Released: 01/06/2002 Document Revised: 11/15/2013 Document Reviewed: 04/07/2011 [facility] Patient Information 2015 [place], [place]. This information is not intended to replace advice given to you by your health care provider. Make sure you discuss any questions you have with your health care provider.

## 2016-04-04 MED ORDER — LEVOTHYROXINE SODIUM 112 MCG PO TABS: 112.0000 ug | ORAL_TABLET | Freq: Every day | ORAL | Status: DC

## 2016-04-04 NOTE — Addendum Note (Signed)
Addended by: Damita Dunnings D on: 04/04/2016 02:53 PM   Modules accepted: Orders, Medications

## 2016-04-22 ENCOUNTER — Other Ambulatory Visit: Payer: Self-pay | Admitting: Internal Medicine

## 2016-05-05 ENCOUNTER — Other Ambulatory Visit (INDEPENDENT_AMBULATORY_CARE_PROVIDER_SITE_OTHER): Payer: Commercial Managed Care - HMO

## 2016-05-05 DIAGNOSIS — E039 Hypothyroidism, unspecified: Secondary | ICD-10-CM | POA: Diagnosis not present

## 2016-05-05 LAB — TSH: TSH: 0.67 u[IU]/mL (ref 0.35–4.50)

## 2016-05-05 NOTE — Addendum Note (Signed)
Addended byDamita Dunnings D on: 05/05/2016 04:45 PM   Modules accepted: Orders

## 2016-05-06 DIAGNOSIS — H402223 Chronic angle-closure glaucoma, left eye, severe stage: Secondary | ICD-10-CM | POA: Diagnosis not present

## 2016-05-06 DIAGNOSIS — H402212 Chronic angle-closure glaucoma, right eye, moderate stage: Secondary | ICD-10-CM | POA: Diagnosis not present

## 2016-05-15 ENCOUNTER — Telehealth: Payer: Self-pay | Admitting: Internal Medicine

## 2016-05-15 DIAGNOSIS — R590 Localized enlarged lymph nodes: Secondary | ICD-10-CM

## 2016-05-15 NOTE — Telephone Encounter (Signed)
Yes, they are ordered incorrectly and need to be changed to the imaging codes listed below

## 2016-05-15 NOTE — Telephone Encounter (Signed)
Per chart, diagnostic mammogram is ordered as well as bilateral ultrasounds.

## 2016-05-15 NOTE — Telephone Encounter (Signed)
Orders changed. 

## 2016-05-15 NOTE — Telephone Encounter (Signed)
[place] Relationship to patient: Can be reached: Pharmacy:  Reason for call:Orders need to be changed for Diagnostic Mammogram and both [place]. New Imaging codes should be: [id number], [id number] and [phone]

## 2016-05-23 ENCOUNTER — Ambulatory Visit
Admission: RE | Admit: 2016-05-23 | Discharge: 2016-05-23 | Disposition: A | Discharge status: Home / Self Care | Payer: Commercial Managed Care - HMO | Source: Ambulatory Visit | Attending: Internal Medicine | Admitting: Internal Medicine

## 2016-05-23 ENCOUNTER — Ambulatory Visit: Admission: RE | Admit: 2016-05-23 | Payer: Commercial Managed Care - HMO | Source: Ambulatory Visit

## 2016-05-23 DIAGNOSIS — N6489 Other specified disorders of breast: Secondary | ICD-10-CM | POA: Diagnosis not present

## 2016-05-23 DIAGNOSIS — R922 Inconclusive mammogram: Secondary | ICD-10-CM | POA: Diagnosis not present

## 2016-06-05 ENCOUNTER — Telehealth: Payer: Self-pay | Admitting: Internal Medicine

## 2016-06-05 NOTE — Telephone Encounter (Signed)
Due for a TSH, please arrange 

## 2016-06-05 NOTE — Telephone Encounter (Signed)
Lab appt scheduled for 06/06/2016 at 1045.

## 2016-06-05 NOTE — Telephone Encounter (Signed)
thx

## 2016-06-06 ENCOUNTER — Other Ambulatory Visit (INDEPENDENT_AMBULATORY_CARE_PROVIDER_SITE_OTHER): Payer: Commercial Managed Care - HMO

## 2016-06-06 DIAGNOSIS — E039 Hypothyroidism, unspecified: Secondary | ICD-10-CM | POA: Diagnosis not present

## 2016-06-06 LAB — TSH: TSH: 0.07 u[IU]/mL — AB (ref 0.35–4.50)

## 2016-06-09 MED ORDER — LEVOTHYROXINE SODIUM 100 MCG PO TABS: 100.0000 ug | ORAL_TABLET | Freq: Every day | ORAL | Status: DC

## 2016-06-09 NOTE — Addendum Note (Signed)
Addended by: Damita Dunnings D on: 06/09/2016 10:13 AM   Modules accepted: Orders, Medications

## 2016-07-07 ENCOUNTER — Other Ambulatory Visit: Payer: Self-pay | Admitting: Internal Medicine

## 2016-07-14 DIAGNOSIS — H402212 Chronic angle-closure glaucoma, right eye, moderate stage: Secondary | ICD-10-CM | POA: Diagnosis not present

## 2016-07-14 DIAGNOSIS — H402223 Chronic angle-closure glaucoma, left eye, severe stage: Secondary | ICD-10-CM | POA: Diagnosis not present

## 2016-07-14 DIAGNOSIS — Z961 Presence of intraocular lens: Secondary | ICD-10-CM | POA: Diagnosis not present

## 2016-07-14 DIAGNOSIS — H35033 Hypertensive retinopathy, bilateral: Secondary | ICD-10-CM | POA: Diagnosis not present

## 2016-07-21 ENCOUNTER — Other Ambulatory Visit (INDEPENDENT_AMBULATORY_CARE_PROVIDER_SITE_OTHER): Payer: Commercial Managed Care - HMO

## 2016-07-21 DIAGNOSIS — E039 Hypothyroidism, unspecified: Secondary | ICD-10-CM

## 2016-07-21 LAB — TSH: TSH: 0.02 u[IU]/mL — ABNORMAL LOW (ref 0.35–4.50)

## 2016-07-22 MED ORDER — LEVOTHYROXINE SODIUM 25 MCG PO TABS: 25.0000 ug | ORAL_TABLET | Freq: Every day | ORAL | 0 refills | Status: DC

## 2016-07-22 MED ORDER — LEVOTHYROXINE SODIUM 25 MCG PO CAPS: 25.0000 ug | ORAL_CAPSULE | Freq: Every day | ORAL | 0 refills | Status: DC

## 2016-07-22 NOTE — Addendum Note (Signed)
Addended byDamita Dunnings D on: 07/22/2016 05:01 PM   Modules accepted: Orders

## 2016-09-03 ENCOUNTER — Other Ambulatory Visit (INDEPENDENT_AMBULATORY_CARE_PROVIDER_SITE_OTHER): Payer: Medicare HMO

## 2016-09-03 DIAGNOSIS — E039 Hypothyroidism, unspecified: Secondary | ICD-10-CM | POA: Diagnosis not present

## 2016-09-03 LAB — TSH: TSH: 86.16 u[IU]/mL — AB (ref 0.35–4.50)

## 2016-09-08 ENCOUNTER — Other Ambulatory Visit: Payer: Self-pay | Admitting: Internal Medicine

## 2016-09-11 MED ORDER — LEVOTHYROXINE SODIUM 50 MCG PO TABS: 50.0000 ug | ORAL_TABLET | Freq: Every day | ORAL | 1 refills | Status: DC

## 2016-10-02 ENCOUNTER — Ambulatory Visit (INDEPENDENT_AMBULATORY_CARE_PROVIDER_SITE_OTHER): Payer: Medicare HMO | Admitting: Internal Medicine

## 2016-10-02 ENCOUNTER — Encounter: Payer: Self-pay | Admitting: Internal Medicine

## 2016-10-02 VITALS — BP 124/78 | HR 68 | Temp 98.5°F | Resp 12 | Ht 65.0 in | Wt 152.4 lb

## 2016-10-02 DIAGNOSIS — Z23 Encounter for immunization: Secondary | ICD-10-CM

## 2016-10-02 DIAGNOSIS — N183 Chronic kidney disease, stage 3 unspecified: Secondary | ICD-10-CM

## 2016-10-02 DIAGNOSIS — E039 Hypothyroidism, unspecified: Secondary | ICD-10-CM

## 2016-10-02 LAB — BASIC METABOLIC PANEL
BUN: 19 mg/dL (ref 6–23)
CALCIUM: 10 mg/dL (ref 8.4–10.5)
CHLORIDE: 107 meq/L (ref 96–112)
CO2: 25 meq/L (ref 19–32)
CREATININE: 1.85 mg/dL — AB (ref 0.40–1.20)
GFR: 33.78 mL/min — ABNORMAL LOW (ref >60.00)
GLUCOSE: 75 mg/dL (ref 70–99)
Potassium: 5 mEq/L (ref 3.5–5.1)
Sodium: 140 mEq/L (ref 135–145)

## 2016-10-02 LAB — TSH: TSH: 50.11 u[IU]/mL — ABNORMAL HIGH (ref 0.35–4.50)

## 2016-10-02 NOTE — Patient Instructions (Signed)
GO TO THE LAB : Get the blood work     GO TO THE FRONT DESK Schedule your next appointment for a  routine checkup in 3 months  Will likely have to recheck your thyroid in few weeks   Check the  blood pressure 2 or 3 times a month  Be sure your blood pressure is between 110/65 and  145/85. If it is consistently higher or lower, let me know

## 2016-10-02 NOTE — Progress Notes (Signed)
Subjective:    Patient ID: [name], female    DOB: [birth year], 81 y.o.   MRN: [id number]  DOS:  10/02/2016 Type of visit - description : Routine visit Interval history: HTN: Good medication compliance, not ambulatory BPs Hypothyroidism: TSH fluctuates. Reports good compliance with medications lately   Review of Systems  No chest pain, difficulty breathing. No cough.  Past Medical History:  Diagnosis Date  . Abnormal MRI of abdomen    ABNORMAL CT and MRI of pancreas, last MRI 1-09: after d/w GI we rec. to f/u clinically and redo XRs if problems    . Anemia   . CRI (chronic renal insufficiency)    elevated creatinine 4-11: stage 3 CKD, sees nephrology, history of solitary   . Diverticulosis   . Glaucoma   . Gout    h/o   . Hypertension   . Hyperthyroidism    s/p ablation, Dr Debbora Presto    Past Surgical History:  Procedure Laterality Date  . CATARACT EXTRACTION W/ INTRAOCULAR LENS  IMPLANT, BILATERAL  2006  . COLONOSCOPY    . ORIF TIBIA PLATEAU  10/24/2012   Procedure: OPEN REDUCTION INTERNAL FIXATION (ORIF) TIBIAL PLATEAU;  Surgeon: Jessy Oto, MD;  Location: [facility];  Service: Orthopedics;  Laterality: Left;    Social History   Social History  . Marital status: Legally Separated    Spouse name: N/A  . Number of children: 1  . Years of education: N/A   Occupational History  . retired     Social History Main Topics  . Smoking status: Never Smoker  . Smokeless tobacco: Never Used  . Alcohol use No  . Drug use: No  . Sexual activity: Not on file   Other Topics Concern  . Not on file   Social History Narrative   Divorced, lives with her sister     Has one daughter who lives here in town        Medication List       Accurate as of 10/02/16 11:00 AM. Always use your most recent med list.          amLODipine 10 MG tablet Commonly known as:  NORVASC Take 1 tablet (10 mg total) by mouth daily.   aspirin 81 MG tablet Take 81 mg by mouth daily.   latanoprost 0.005 % ophthalmic solution Commonly known as:  XALATAN 1 drop at bedtime.   levothyroxine 50 MCG tablet Commonly known as:  SYNTHROID, LEVOTHROID Take 1 tablet (50 mcg total) by mouth daily before breakfast.   valsartan 160 MG tablet Commonly known as:  DIOVAN Take 1 tablet (160 mg total) by mouth daily.          Objective:   Physical Exam BP 124/78 (BP Location: Left Arm, Patient Position: Sitting, Cuff Size: Normal)   Pulse 68   Temp 98.5 F (36.9 C) (Oral)   Resp 12   Ht 5\' 5"  (1.651 m)   Wt 152 lb 6 oz (69.1 kg)   SpO2 97%   BMI 25.36 kg/m  General:   Well developed, well nourished . NAD.  HEENT:  Normocephalic . Face symmetric, atraumatic Lungs:  CTA B Normal respiratory effort, no intercostal retractions, no accessory muscle use. Heart: RRR,  no murmur.  No pretibial edema bilaterally  Skin: Not pale. Not jaundice Neurologic:  alert & oriented X3.  Speech normal, gait appropriate for age and unassisted Psych--  Cognition and judgment appear intact.  Cooperative with normal attention span  and concentration.  Behavior appropriate. No anxious or depressed appearing.      Assessment & Plan:   Assessment HTN CKD, stage III, Single kidney (absent L one); sees nephrology prn, last visit 2014. (Creatinine 1.8 = clearance ~ 30) Hyperthyroidism, s/p ablation, Dr. Chalmers Cater, no f/u schedule  Glaucoma H/o Gout H/o abnormal MRI abdomen , last MRI 2009, saw GI ---> follow up clinically, redo MRI prn  Plan: HTN: Controlled. Continue amlodipine and Diovan CKD stage III: Check a BMP  hypothyroidism: TSH varies  widely, reason unclear (compliance?), last TSH was 86, she was taking Synthroid 25 mg daily w/ reportedly good compliance, dose increased to 50 mg and again reports good compliance lately. Plan--check a TSH Flu shot today RTC 6 months

## 2016-10-02 NOTE — Progress Notes (Signed)
Pre visit review using our clinic review tool, if applicable. No additional management support is needed unless otherwise documented below in the visit note. 

## 2016-10-03 NOTE — Assessment & Plan Note (Signed)
HTN: Controlled. Continue amlodipine and Diovan CKD stage III: Check a BMP  hypothyroidism: TSH varies  widely, reason unclear (compliance?), last TSH was 86, she was taking Synthroid 25 mg daily w/ reportedly good compliance, dose increased to 50 mg and again reports good compliance lately. Plan--check a TSH Flu shot today RTC 6 months

## 2016-10-09 MED ORDER — LEVOTHYROXINE SODIUM 75 MCG PO TABS: 75.0000 ug | ORAL_TABLET | Freq: Every day | ORAL | 1 refills | Status: DC

## 2016-10-09 NOTE — Addendum Note (Signed)
Addended byDamita Dunnings D on: 10/09/2016 01:43 PM   Modules accepted: Orders

## 2016-11-04 ENCOUNTER — Other Ambulatory Visit (INDEPENDENT_AMBULATORY_CARE_PROVIDER_SITE_OTHER): Payer: Commercial Managed Care - HMO

## 2016-11-04 DIAGNOSIS — E039 Hypothyroidism, unspecified: Secondary | ICD-10-CM

## 2016-11-04 LAB — TSH: TSH: 2.65 u[IU]/mL (ref 0.35–4.50)

## 2016-11-11 ENCOUNTER — Other Ambulatory Visit: Payer: Self-pay

## 2016-11-11 DIAGNOSIS — E039 Hypothyroidism, unspecified: Secondary | ICD-10-CM

## 2016-11-11 MED ORDER — LEVOTHYROXINE SODIUM 75 MCG PO TABS: 75.0000 ug | ORAL_TABLET | Freq: Every day | ORAL | 1 refills | Status: DC

## 2016-11-21 ENCOUNTER — Other Ambulatory Visit: Payer: Self-pay | Admitting: Internal Medicine

## 2016-12-04 ENCOUNTER — Other Ambulatory Visit: Payer: Self-pay | Admitting: Internal Medicine

## 2016-12-04 ENCOUNTER — Telehealth: Payer: Self-pay | Admitting: Internal Medicine

## 2016-12-04 DIAGNOSIS — E039 Hypothyroidism, unspecified: Secondary | ICD-10-CM

## 2016-12-04 MED ORDER — LEVOTHYROXINE SODIUM 75 MCG PO TABS: 75.0000 ug | ORAL_TABLET | Freq: Every day | ORAL | 1 refills | Status: DC

## 2016-12-04 NOTE — Telephone Encounter (Signed)
Rx sent 

## 2016-12-04 NOTE — Telephone Encounter (Signed)
Relation to [id number] Call back [place]: [place], [place] [phone] (Phone) [phone] (Fax)    Reason for call:  atient requesting a refill levothyroxine (SYNTHROID, LEVOTHROID) 75 MCG tablet

## 2016-12-31 ENCOUNTER — Encounter: Payer: Self-pay | Admitting: Internal Medicine

## 2016-12-31 ENCOUNTER — Ambulatory Visit (INDEPENDENT_AMBULATORY_CARE_PROVIDER_SITE_OTHER): Payer: Medicare HMO | Admitting: Internal Medicine

## 2016-12-31 VITALS — BP 134/78 | HR 69 | Temp 97.9°F | Resp 12 | Ht 65.0 in | Wt 149.4 lb

## 2016-12-31 DIAGNOSIS — N183 Chronic kidney disease, stage 3 unspecified: Secondary | ICD-10-CM

## 2016-12-31 DIAGNOSIS — E039 Hypothyroidism, unspecified: Secondary | ICD-10-CM

## 2016-12-31 DIAGNOSIS — Z0000 Encounter for general adult medical examination without abnormal findings: Secondary | ICD-10-CM

## 2016-12-31 LAB — BASIC METABOLIC PANEL
BUN: 19 mg/dL (ref 6–23)
CALCIUM: 9.9 mg/dL (ref 8.4–10.5)
CO2: 24 mEq/L (ref 19–32)
CREATININE: 1.81 mg/dL — AB (ref 0.40–1.20)
Chloride: 108 mEq/L (ref 96–112)
GFR: 34.62 mL/min — AB (ref >60.00)
Glucose, Bld: 85 mg/dL (ref 70–99)
Potassium: 4.6 mEq/L (ref 3.5–5.1)
Sodium: 138 mEq/L (ref 135–145)

## 2016-12-31 LAB — TSH: TSH: 1.06 u[IU]/mL (ref 0.35–4.50)

## 2016-12-31 NOTE — Patient Instructions (Signed)
GO TO THE LAB : Get the blood work     GO TO THE FRONT DESK Schedule your next appointment for a  Physical exam and a medicare wellness with on of our nurses in 5 to 6 months

## 2016-12-31 NOTE — Progress Notes (Signed)
Pre visit review using our clinic review tool, if applicable. No additional management support is needed unless otherwise documented below in the visit note. 

## 2016-12-31 NOTE — Progress Notes (Signed)
Subjective:    Patient ID: [name], female    DOB: Oct 18, 1935, 82 y.o.   MRN: [id number]  DOS:  12/31/2016 Type of visit - description : rov Interval history: Reports she is doing well, good compliance w/ medications, she has been able to remain active. No major concerns   Review of Systems Denies chest pain or difficulty breathing No lower extremity edema No nausea, vomiting, diarrhea  Past Medical History:  Diagnosis Date  . Abnormal MRI of abdomen    ABNORMAL CT and MRI of pancreas, last MRI 1-09: after d/w GI we rec. to f/u clinically and redo XRs if problems    . Anemia   . CRI (chronic renal insufficiency)    elevated creatinine 4-11: stage 3 CKD, sees nephrology, history of solitary   . Diverticulosis   . Glaucoma   . Gout    h/o   . Hypertension   . Hyperthyroidism    s/p ablation, Dr Debbora Presto    Past Surgical History:  Procedure Laterality Date  . CATARACT EXTRACTION W/ INTRAOCULAR LENS  IMPLANT, BILATERAL  2006  . COLONOSCOPY    . ORIF TIBIA PLATEAU  10/24/2012   Procedure: OPEN REDUCTION INTERNAL FIXATION (ORIF) TIBIAL PLATEAU;  Surgeon: Jessy Oto, MD;  Location: [facility];  Service: Orthopedics;  Laterality: Left;    Social History   Social History  . Marital status: Legally Separated    Spouse name: N/A  . Number of children: 1  . Years of education: N/A   Occupational History  . retired     Social History Main Topics  . Smoking status: Never Smoker  . Smokeless tobacco: Never Used  . Alcohol use No  . Drug use: No  . Sexual activity: Not on file   Other Topics Concern  . Not on file   Social History Narrative   Divorced, lives with her sister     Has one daughter who lives here in town      Allergies as of 12/31/2016   No Known Allergies     Medication List       Accurate as of 12/31/16 11:59 PM. Always use your most recent med list.          amLODipine 10 MG tablet Commonly known as:  NORVASC Take 1 tablet (10 mg total) by  mouth daily.   aspirin 81 MG tablet Take 81 mg by mouth daily.   latanoprost 0.005 % ophthalmic solution Commonly known as:  XALATAN 1 drop at bedtime.   levothyroxine 75 MCG tablet Commonly known as:  SYNTHROID, LEVOTHROID Take 1 tablet (75 mcg total) by mouth daily before breakfast.   valsartan 160 MG tablet Commonly known as:  DIOVAN Take 1 tablet (160 mg total) by mouth daily.          Objective:   Physical Exam BP 134/78 (BP Location: Left Arm, Patient Position: Sitting, Cuff Size: Small)   Pulse 69   Temp 97.9 F (36.6 C) (Oral)   Resp 12   Ht 5\' 5"  (1.651 m)   Wt 149 lb 6 oz (67.8 kg)   SpO2 98%   BMI 24.86 kg/m  General:   Well developed, well nourished . NAD.  HEENT:  Normocephalic . Face symmetric, atraumatic Lungs:  CTA B Normal respiratory effort, no intercostal retractions, no accessory muscle use. Heart: RRR,  no murmur.  No pretibial edema bilaterally  Skin: Not pale. Not jaundice Neurologic:  alert & oriented X3.  Speech  normal, gait appropriate for age and unassisted Psych--  Cognition and judgment appear intact.  Cooperative with normal attention span and concentration.  Behavior appropriate. No anxious or depressed appearing.      Assessment & Plan:   Assessment HTN CKD, stage III, Single kidney (absent L one); sees nephrology prn, last visit 2014. (Creatinine 1.8 = clearance ~ 30) Hyperthyroidism, s/p ablation, Dr. Chalmers Cater, no f/u schedule  Glaucoma H/o Gout H/o abnormal MRI abdomen , last MRI 2009, saw GI ---> follow up clinically, redo MRI prn  PLAN: HTN: Controlled on amlodipine and Diovan. CKD: Checking a BMP today Hypothyroidism: Good compliance with Synthroid 75 daily. Check a TSH RTC 5-6 months, CPX-Medicare wellness exam.

## 2016-12-31 NOTE — Assessment & Plan Note (Signed)
We talk about scheduling a bone density test: Not interested Since the last physical exam, mammogram and ultrasound were negative.

## 2017-01-01 NOTE — Assessment & Plan Note (Signed)
HTN: Controlled on amlodipine and Diovan. CKD: Checking a BMP today Hypothyroidism: Good compliance with Synthroid 75 daily. Check a TSH RTC 5-6 months, CPX-Medicare wellness exam.

## 2017-01-12 ENCOUNTER — Telehealth: Payer: Self-pay | Admitting: Internal Medicine

## 2017-01-12 NOTE — Telephone Encounter (Signed)
Patient scheduled AWV with Hoyle Sauer for 07/07/17 at 9:30am and physical with PCP at 10am.

## 2017-01-16 DIAGNOSIS — H402212 Chronic angle-closure glaucoma, right eye, moderate stage: Secondary | ICD-10-CM | POA: Diagnosis not present

## 2017-01-16 DIAGNOSIS — H402223 Chronic angle-closure glaucoma, left eye, severe stage: Secondary | ICD-10-CM | POA: Diagnosis not present

## 2017-01-26 ENCOUNTER — Emergency Department (HOSPITAL_COMMUNITY)
Admission: EM | Admit: 2017-01-26 | Discharge: 2017-01-26 | Disposition: A | Discharge status: Home / Self Care | Payer: Medicare HMO | Attending: Emergency Medicine | Admitting: Emergency Medicine

## 2017-01-26 ENCOUNTER — Emergency Department (HOSPITAL_COMMUNITY): Payer: Medicare HMO

## 2017-01-26 ENCOUNTER — Encounter (HOSPITAL_COMMUNITY): Payer: Self-pay | Admitting: Emergency Medicine

## 2017-01-26 DIAGNOSIS — Z79899 Other long term (current) drug therapy: Secondary | ICD-10-CM | POA: Insufficient documentation

## 2017-01-26 DIAGNOSIS — R202 Paresthesia of skin: Secondary | ICD-10-CM | POA: Diagnosis not present

## 2017-01-26 DIAGNOSIS — Z7982 Long term (current) use of aspirin: Secondary | ICD-10-CM | POA: Diagnosis not present

## 2017-01-26 DIAGNOSIS — N183 Chronic kidney disease, stage 3 (moderate): Secondary | ICD-10-CM | POA: Diagnosis not present

## 2017-01-26 DIAGNOSIS — E039 Hypothyroidism, unspecified: Secondary | ICD-10-CM | POA: Insufficient documentation

## 2017-01-26 DIAGNOSIS — I129 Hypertensive chronic kidney disease with stage 1 through stage 4 chronic kidney disease, or unspecified chronic kidney disease: Secondary | ICD-10-CM | POA: Insufficient documentation

## 2017-01-26 DIAGNOSIS — R51 Headache: Secondary | ICD-10-CM | POA: Diagnosis not present

## 2017-01-26 DIAGNOSIS — R200 Anesthesia of skin: Secondary | ICD-10-CM | POA: Diagnosis present

## 2017-01-26 LAB — BASIC METABOLIC PANEL
Anion gap: 7 (ref 5–15)
BUN: 24 mg/dL — ABNORMAL HIGH (ref 6–20)
CHLORIDE: 107 mmol/L (ref 101–111)
CO2: 22 mmol/L (ref 22–32)
CREATININE: 1.81 mg/dL — AB (ref 0.44–1.00)
Calcium: 9.1 mg/dL (ref 8.9–10.3)
GFR calc non Af Amer: 25 mL/min — ABNORMAL LOW (ref >60)
GFR, EST AFRICAN AMERICAN: 30 mL/min — AB (ref >60)
Glucose, Bld: 157 mg/dL — ABNORMAL HIGH (ref 65–99)
Potassium: 5.5 mmol/L — ABNORMAL HIGH (ref 3.5–5.1)
Sodium: 136 mmol/L (ref 135–145)

## 2017-01-26 LAB — CBC WITH DIFFERENTIAL/PLATELET
Basophils Absolute: 0 10*3/uL (ref 0.0–0.1)
Basophils Relative: 0 %
EOS ABS: 0 10*3/uL (ref 0.0–0.7)
Eosinophils Relative: 0 %
HCT: 33.5 % — ABNORMAL LOW (ref 36.0–46.0)
HEMOGLOBIN: 11.3 g/dL — AB (ref 12.0–15.0)
LYMPHS ABS: 0.9 10*3/uL (ref 0.7–4.0)
LYMPHS PCT: 10 %
MCH: 29 pg (ref 26.0–34.0)
MCHC: 33.7 g/dL (ref 30.0–36.0)
MCV: 86.1 fL (ref 78.0–100.0)
Monocytes Absolute: 0.1 10*3/uL (ref 0.1–1.0)
Monocytes Relative: 1 %
NEUTROS PCT: 89 %
Neutro Abs: 7.4 10*3/uL (ref 1.7–7.7)
Platelets: 351 10*3/uL (ref 150–400)
RBC: 3.89 MIL/uL (ref 3.87–5.11)
RDW: 14.9 % (ref 11.5–15.5)
WBC: 8.4 10*3/uL (ref 4.0–10.5)

## 2017-01-26 MED ORDER — SODIUM CHLORIDE 0.9 % IV SOLN
INTRAVENOUS | Status: DC
  Administered 2017-01-26: 18:00:00 via INTRAVENOUS

## 2017-01-26 MED ORDER — MORPHINE SULFATE (PF) 4 MG/ML IV SOLN
2.0000 mg | Freq: Once | INTRAVENOUS | Status: AC
Start: 2017-01-26 — End: 2017-01-26
  Administered 2017-01-26: 2 mg via INTRAVENOUS
  Filled 2017-01-26: qty 1

## 2017-01-26 NOTE — ED Provider Notes (Signed)
[facility] DEPT Provider Note   CSN: [id number] Arrival date & time: 01/26/17  1307     History   Chief Complaint Chief Complaint  Patient presents with  . Headache  . Visual Field Change  . Numbness    HPI [name] is a 82 y.o. female.  82 year old female presents with acute onset of right-sided headache with blurred vision and left hand numbness. Hand numbness lasting for possibly 5 minutes and was from the MCP joints down to the tips of her finger. Denied any vision loss. Right-sided headache was sharp and has been persistent. No associated nausea vomiting or confusion. No ataxia noted. No prior history of same. A recent medication changes. Symptoms occurred proximally 6 hours ago. No treatment use prior to arrival      Past Medical History:  Diagnosis Date  . Abnormal MRI of abdomen    ABNORMAL CT and MRI of pancreas, last MRI 1-09: after d/w GI we rec. to f/u clinically and redo XRs if problems    . Anemia   . CRI (chronic renal insufficiency)    elevated creatinine 4-11: stage 3 CKD, sees nephrology, history of solitary   . Diverticulosis   . Glaucoma   . Gout    h/o   . Hypertension   . Hyperthyroidism    s/p ablation, Dr Debbora Presto    Patient Active Problem List   Diagnosis Date Noted  . PCP NOTES >>>>>>>>>>>>>>>>>>>>>> 04/01/2016  . Gout 02/06/2014  . Glaucoma 12/07/2012  . Annual physical exam 08/08/2011  . GOUT, UNSPECIFIED 09/23/2010  . CKD (chronic kidney disease) stage 3, GFR 30-59 ml/min 09/23/2010  . Hypothyroidism 06/13/2008  . DIVERTICULOSIS, COLON 01/27/2008  . Anemia 08/03/2007  . Abnormal MRI of abdomen 08/03/2007  . Essential hypertension 05/31/2007    Past Surgical History:  Procedure Laterality Date  . CATARACT EXTRACTION W/ INTRAOCULAR LENS  IMPLANT, BILATERAL  2006  . COLONOSCOPY    . ORIF TIBIA PLATEAU  10/24/2012   Procedure: OPEN REDUCTION INTERNAL FIXATION (ORIF) TIBIAL PLATEAU;  Surgeon: Jessy Oto, MD;  Location: [facility]  [facility];  Service: Orthopedics;  Laterality: Left;    OB History    No data available       Home Medications    Prior to Admission medications   Medication Sig Start Date End Date Taking? Authorizing Provider  amLODipine (NORVASC) 10 MG tablet Take 1 tablet (10 mg total) by mouth daily. 11/25/16   Colon Branch, MD  aspirin 81 MG tablet Take 81 mg by mouth daily.      Historical Provider, MD  latanoprost (XALATAN) 0.005 % ophthalmic solution 1 drop at bedtime.    Historical Provider, MD  levothyroxine (SYNTHROID, LEVOTHROID) 75 MCG tablet Take 1 tablet (75 mcg total) by mouth daily before breakfast. 12/04/16   Colon Branch, MD  valsartan (DIOVAN) 160 MG tablet Take 1 tablet (160 mg total) by mouth daily. 09/09/16   Colon Branch, MD    Family History Family History  Problem Relation Age of Onset  . Heart attack Sister   . Colon cancer Sister     colon  . Heart attack Brother   . Pancreatic cancer Brother   . Diabetes Mother   . Esophageal cancer Neg Hx   . Stomach cancer Neg Hx   . Rectal cancer Neg Hx   . Breast cancer Neg Hx     Social History Social History  Substance Use Topics  . Smoking status: Never Smoker  .  Smokeless tobacco: Never Used  . Alcohol use No     Allergies   Patient has no known allergies.   Review of Systems Review of Systems  All other systems reviewed and are negative.    Physical Exam Updated Vital Signs BP 175/74 (BP Location: Left Arm)   Pulse 83   Temp 97.5 F (36.4 C) (Oral)   Resp 18   Ht 5\' 5"  (1.651 m)   Wt 66.2 kg   SpO2 100%   BMI 24.30 kg/m   Physical Exam  Constitutional: She is oriented to person, place, and time. She appears well-developed and well-nourished.  Non-toxic appearance. No distress.  HENT:  Head: Normocephalic and atraumatic.  Eyes: Conjunctivae, EOM and lids are normal. Pupils are equal, round, and reactive to light.  Neck: Normal range of motion. Neck supple. No tracheal deviation present. No thyroid mass  present.  Cardiovascular: Normal rate, regular rhythm and normal heart sounds.  Exam reveals no gallop.   No murmur heard. Pulmonary/Chest: Effort normal and breath sounds normal. No stridor. No respiratory distress. She has no decreased breath sounds. She has no wheezes. She has no rhonchi. She has no rales.  Abdominal: Soft. Normal appearance and bowel sounds are normal. She exhibits no distension. There is no tenderness. There is no rebound and no CVA tenderness.  Musculoskeletal: Normal range of motion. She exhibits no edema or tenderness.  Neurological: She is alert and oriented to person, place, and time. She has normal strength. No cranial nerve deficit or sensory deficit. GCS eye subscore is 4. GCS verbal subscore is 5. GCS motor subscore is 6.  Skin: Skin is warm and dry. No abrasion and no rash noted.  Psychiatric: She has a normal mood and affect. Her speech is normal and behavior is normal.  Nursing note and vitals reviewed.    ED Treatments / Results  Labs (all labs ordered are listed, but only abnormal results are displayed) Labs Reviewed  CBC WITH DIFFERENTIAL/PLATELET  BASIC METABOLIC PANEL    EKG  EKG Interpretation  Date/Time:  Monday January 26 2017 18:00:24 EST Ventricular Rate:  88 PR Interval:    QRS Duration: 80 QT Interval:  387 QTC Calculation: 469 R Axis:   56 Text Interpretation:  Sinus rhythm Abnormal R-wave progression, early transition Confirmed by Giani Betzold  MD, Autumm Hattery ([id number]) on 01/26/2017 6:58:36 PM       Radiology No results found.  Procedures Procedures (including critical care time)  Medications Ordered in ED Medications  0.9 %  sodium chloride infusion (not administered)  morphine 4 MG/ML injection 2 mg (not administered)     Initial Impression / Assessment and Plan / ED Course  I have reviewed the triage vital signs and the nursing notes.  Pertinent labs & imaging results that were available during my care of the patient were reviewed  by me and considered in my medical decision making (see chart for details).     Head CT and blood work are without acute changes. Patient has no neurological deficits. Patient never had any vision loss. Was only slightly blurred. Her numbness at her left hand was only for about 5 minutes and was only from the knuckles down. She had an associated headache prior to all this starting. Suspect that this might be a complex migraine. Return precautions given  Final Clinical Impressions(s) / ED Diagnoses   Final diagnoses:  None    New Prescriptions New Prescriptions   No medications on file  Lacretia Leigh, MD 01/26/17 2030

## 2017-01-26 NOTE — Discharge Instructions (Signed)
Return here at once for weakness in his extremities, severe headaches, any other problems. Call your doctor to schedule a follow-up visit.

## 2017-01-26 NOTE — ED Triage Notes (Signed)
Patient c/o headache, blurred vision and had left hand numbness that started around 1130a-12noon today. Patient reports numbness has stopped in hand.  Patient has PMH thyroid and HTN.

## 2017-02-10 ENCOUNTER — Other Ambulatory Visit: Payer: Self-pay | Admitting: Internal Medicine

## 2017-02-10 DIAGNOSIS — E039 Hypothyroidism, unspecified: Secondary | ICD-10-CM

## 2017-04-03 ENCOUNTER — Encounter: Payer: Medicare HMO | Admitting: Internal Medicine

## 2017-04-28 ENCOUNTER — Other Ambulatory Visit: Payer: Self-pay | Admitting: Internal Medicine

## 2017-06-23 ENCOUNTER — Telehealth: Payer: Self-pay

## 2017-06-23 MED ORDER — LOSARTAN POTASSIUM 50 MG PO TABS: 50.0000 mg | ORAL_TABLET | Freq: Every day | ORAL | 0 refills | Status: DC

## 2017-06-23 NOTE — Telephone Encounter (Signed)
Advise patient: Changed to losartan 50 mg 1 by mouth daily #30 no refills. We'll check labs when she comes back for her appointment 07-07-17

## 2017-06-23 NOTE — Telephone Encounter (Signed)
Pt on valsartan- please advise.

## 2017-06-23 NOTE — Telephone Encounter (Signed)
Tried calling Pt, no answer, unable to leave message.  °

## 2017-06-24 ENCOUNTER — Other Ambulatory Visit: Payer: Self-pay | Admitting: Internal Medicine

## 2017-06-26 NOTE — Telephone Encounter (Signed)
Spoke w/ Pt, informed her of med recall and change in medication to losartan. Pt verbalized understanding.

## 2017-07-07 ENCOUNTER — Encounter: Payer: Self-pay | Admitting: Internal Medicine

## 2017-07-07 ENCOUNTER — Ambulatory Visit (INDEPENDENT_AMBULATORY_CARE_PROVIDER_SITE_OTHER): Payer: Medicare HMO | Admitting: Internal Medicine

## 2017-07-07 VITALS — BP 126/66 | HR 70 | Temp 98.0°F | Resp 14 | Ht 65.0 in | Wt 143.5 lb

## 2017-07-07 DIAGNOSIS — Z0000 Encounter for general adult medical examination without abnormal findings: Secondary | ICD-10-CM

## 2017-07-07 DIAGNOSIS — Z1231 Encounter for screening mammogram for malignant neoplasm of breast: Secondary | ICD-10-CM | POA: Diagnosis not present

## 2017-07-07 LAB — COMPREHENSIVE METABOLIC PANEL
ALBUMIN: 4 g/dL (ref 3.5–5.2)
ALK PHOS: 76 U/L (ref 39–117)
ALT: 9 U/L (ref 0–35)
AST: 15 U/L (ref 0–37)
BUN: 25 mg/dL — ABNORMAL HIGH (ref 6–23)
CALCIUM: 9.6 mg/dL (ref 8.4–10.5)
CHLORIDE: 109 meq/L (ref 96–112)
CO2: 23 mEq/L (ref 19–32)
CREATININE: 1.78 mg/dL — AB (ref 0.40–1.20)
GFR: 35.24 mL/min — ABNORMAL LOW (ref >60.00)
Glucose, Bld: 78 mg/dL (ref 70–99)
POTASSIUM: 4.4 meq/L (ref 3.5–5.1)
Sodium: 138 mEq/L (ref 135–145)
Total Bilirubin: 0.5 mg/dL (ref 0.2–1.2)
Total Protein: 7.3 g/dL (ref 6.0–8.3)

## 2017-07-07 LAB — LIPID PANEL
CHOLESTEROL: 113 mg/dL (ref 0–200)
HDL: 52.6 mg/dL (ref >39.00)
LDL CALC: 45 mg/dL (ref 0–99)
NonHDL: 60.88
Total CHOL/HDL Ratio: 2
Triglycerides: 79 mg/dL (ref 0.0–149.0)
VLDL: 15.8 mg/dL (ref 0.0–40.0)

## 2017-07-07 LAB — TSH: TSH: 0.44 u[IU]/mL (ref 0.35–4.50)

## 2017-07-07 LAB — HEMOGLOBIN A1C: Hgb A1c MFr Bld: 5.5 % (ref 4.6–6.5)

## 2017-07-07 NOTE — Progress Notes (Signed)
Subjective:    Patient ID: [name], female    DOB: 01/19/1935, 82 y.o.   MRN: [id number]  DOS:  07/07/2017 Type of visit - description : cpx Interval history: No major concerns, BP medication changed to losartan, good compliance and tolerance   Review of Systems   Other than above, a 14 point review of systems is negative    Past Medical History:  Diagnosis Date  . Abnormal MRI of abdomen    ABNORMAL CT and MRI of pancreas, last MRI 1-09: after d/w GI we rec. to f/u clinically and redo XRs if problems    . Anemia   . CRI (chronic renal insufficiency)    elevated creatinine 4-11: stage 3 CKD, sees nephrology, history of solitary   . Diverticulosis   . Glaucoma   . Gout    h/o   . Hypertension   . Hyperthyroidism    s/p ablation, Dr Debbora Presto    Past Surgical History:  Procedure Laterality Date  . CATARACT EXTRACTION W/ INTRAOCULAR LENS  IMPLANT, BILATERAL  2006  . COLONOSCOPY    . ORIF TIBIA PLATEAU  10/24/2012   Procedure: OPEN REDUCTION INTERNAL FIXATION (ORIF) TIBIAL PLATEAU;  Surgeon: Jessy Oto, MD;  Location: [facility];  Service: Orthopedics;  Laterality: Left;    Social History   Social History  . Marital status: Legally Separated    Spouse name: N/A  . Number of children: 1  . Years of education: N/A   Occupational History  . retired , Scientist, water quality     Social History Main Topics  . Smoking status: Never Smoker  . Smokeless tobacco: Never Used  . Alcohol use No  . Drug use: No  . Sexual activity: Not on file   Other Topics Concern  . Not on file   Social History Narrative   Divorced, lives with her sister     Has one daughter who lives here in town     Family History  Problem Relation Age of Onset  . Heart attack Sister   . Colon cancer Sister        colon  . Heart attack Brother   . Pancreatic cancer Brother   . Diabetes Mother   . Esophageal cancer Neg Hx   . Stomach cancer Neg Hx   . Rectal cancer Neg Hx   . Breast cancer Neg Hx       Allergies as of 07/07/2017   No Known Allergies     Medication List       Accurate as of 07/07/17  6:55 PM. Always use your most recent med list.          amLODipine 10 MG tablet Commonly known as:  NORVASC Take 1 tablet (10 mg total) by mouth daily.   aspirin 81 MG tablet Take 81 mg by mouth daily.   latanoprost 0.005 % ophthalmic solution Commonly known as:  XALATAN Place 1 drop into both eyes at bedtime.   levothyroxine 75 MCG tablet Commonly known as:  SYNTHROID, LEVOTHROID Take 1 tablet (75 mcg total) by mouth daily before breakfast.   losartan 50 MG tablet Commonly known as:  COZAAR Take 1 tablet (50 mg total) by mouth daily.          Objective:   Physical Exam BP 126/66 (BP Location: Left Arm, Patient Position: Sitting, Cuff Size: Small)   Pulse 70   Temp 98 F (36.7 C) (Oral)   Resp 14   Ht 5\' 5"  (  1.651 m)   Wt 143 lb 8 oz (65.1 kg)   SpO2 96%   BMI 23.88 kg/m   General:   Well developed, well nourished . NAD.  Neck: No  thyromegaly  HEENT:  Normocephalic . Face symmetric, atraumatic Lungs:  CTA B Normal respiratory effort, no intercostal retractions, no accessory muscle use. Heart: RRR,  no murmur.  No pretibial edema bilaterally  Abdomen:  Not distended, soft, non-tender. No rebound or rigidity.   Skin: Exposed areas without rash. Not pale. Not jaundice Neurologic:  alert & oriented X3.  Speech normal, gait appropriate for age and unassisted Strength symmetric and appropriate for age.  Psych: Cognition and judgment appear intact.  Cooperative with normal attention span and concentration.  Behavior appropriate. No anxious or depressed appearing.    Assessment & Plan:   Assessment HTN CKD, stage III, Single kidney (absent L one); sees nephrology prn, last visit 2014. (Creatinine 1.8 = clearance ~ 30) Hypothyroidism:  s/p ablation, Dr. Chalmers Cater, no f/u schedule  Glaucoma H/o Gout H/o abnormal MRI abdomen , last MRI 2009, saw GI  ---> follow up clinically, redo MRI prn  PLAN: HTN: On amlodipine and losartan (switch to losartan 2 weeks ago). Checking labs. BP today is very good. Rec amb BPs CKD: Monitoring labs Hypothyroidism: Continue Synthroid, check a TSH History of gout: Asx RTC 8-10 months

## 2017-07-07 NOTE — Assessment & Plan Note (Addendum)
-  Td: 2015; Pneumonia shot: 2015; prevnar : 2016; shingles shot benefits  Discussed (shingrex on back order)  -CCS: + FH Colon Ca;  cscope  2008, no polyps;  cscope again 08/2012,Dr Henrene Pastor, (+)adenomatous polyps--due 2018 but is not interested - cervical Ca screening: no further PAPs, see previous entries  -breast ca screening: last MMG 04-2016 neg , schedule a MMG. - (+) FH CAD- on  aspirin 81 mg daily - DEXA ?  benefits discussed, declined. rx calcium and vitamin D. -diet and  exercise discussed  -Labs: CMP, FLP, A1c (last CBG slt elevated) , TSH

## 2017-07-07 NOTE — Progress Notes (Signed)
Pre visit review using our clinic review tool, if applicable. No additional management support is needed unless otherwise documented below in the visit note. 

## 2017-07-07 NOTE — Assessment & Plan Note (Signed)
HTN: On amlodipine and losartan (switch to losartan 2 weeks ago). Checking labs. BP today is very good. Rec amb BPs CKD: Monitoring labs Hypothyroidism: Continue Synthroid, check a TSH History of gout: Asx RTC 8-10 months

## 2017-07-07 NOTE — Assessment & Plan Note (Deleted)
-  Td: 2015; Pneumonia shot: 2015; prevnar : 2016; shingles shot benefits  discussed, declined    -CCS: + FH Colon Ca;  cscope  2008, no polyps;  cscope again 08/2012,Dr Henrene Pastor, (+)adenomatous polyps--due 2018 but is not interested - cervical Ca screening: no further PAPs, see previous entries  -breast ca screening: last MMG 04-2016 neg  - (+) FH CAD- on  aspirin 81 mg daily - DEXA x 1 ~ 2014?  (normal) @ Dr Chalmers Cater, h/o a Fx  >> plan: Ca, vit D, DEXA   -diet and  exercise discussed

## 2017-07-07 NOTE — Patient Instructions (Signed)
GO TO THE LAB : Get the blood work     GO TO THE FRONT DESK Schedule your next appointment for a  routine checkup in 10 months   Please reschedule your Medicare wellness  Check the  blood pressure 2 or 3 times a month   Be sure your blood pressure is between 110/65 and  145/85. If it is consistently higher or lower, let me know

## 2017-07-07 NOTE — Progress Notes (Deleted)
Subjective:   Judith KULKARNI is a 82 y.o. female who presents for Medicare Annual (Subsequent) preventive examination.  Review of Systems:  No ROS.  Medicare Wellness Visit. Additional risk factors are reflected in the social history.     Sleep patterns: {SX; SLEEP PATTERNS:18802::"feels rested on waking","does not get up to void","gets up *** times nightly to void","sleeps *** hours nightly"}.   Home Safety/Smoke Alarms: Feels safe in home. Smoke alarms in place.  Living environment; residence and Firearm Safety: {Rehab home environment / accessibility:30080::"no firearms","firearms stored safely"}. Seat Belt Safety/Bike Helmet: Wears seat belt.   Counseling:   Eye Exam-  Dental-  Female:   Pap- Aged out      Mammo- last 05/23/2016. BI-RADS CATEGORY  2: Benign.        Dexa scan- Not on file.      CCS- last 09/16/2012. 2 polyps, moderate diverticulosis. 5 year recall.      Objective:     Vitals: There were no vitals taken for this visit.  There is no height or weight on file to calculate BMI.   Tobacco History  Smoking Status  . Never Smoker  Smokeless Tobacco  . Never Used     Counseling given: Not Answered   Past Medical History:  Diagnosis Date  . Abnormal MRI of abdomen    ABNORMAL CT and MRI of pancreas, last MRI 1-09: after d/w GI we rec. to f/u clinically and redo XRs if problems    . Anemia   . CRI (chronic renal insufficiency)    elevated creatinine 4-11: stage 3 CKD, sees nephrology, history of solitary   . Diverticulosis   . Glaucoma   . Gout    h/o   . Hypertension   . Hyperthyroidism    s/p ablation, Dr Debbora Presto   Past Surgical History:  Procedure Laterality Date  . CATARACT EXTRACTION W/ INTRAOCULAR LENS  IMPLANT, BILATERAL  2006  . COLONOSCOPY    . ORIF TIBIA PLATEAU  10/24/2012   Procedure: OPEN REDUCTION INTERNAL FIXATION (ORIF) TIBIAL PLATEAU;  Surgeon: Jessy Oto, MD;  Location: [facility];  Service: Orthopedics;  Laterality: Left;    Family History  Problem Relation Age of Onset  . Heart attack Sister   . Colon cancer Sister        colon  . Heart attack Brother   . Pancreatic cancer Brother   . Diabetes Mother   . Esophageal cancer Neg Hx   . Stomach cancer Neg Hx   . Rectal cancer Neg Hx   . Breast cancer Neg Hx    History  Sexual Activity  . Sexual activity: Not on file    Outpatient Encounter Prescriptions as of 07/07/2017  Medication Sig  . amLODipine (NORVASC) 10 MG tablet Take 1 tablet (10 mg total) by mouth daily.  Marland Kitchen aspirin 81 MG tablet Take 81 mg by mouth daily.    Marland Kitchen latanoprost (XALATAN) 0.005 % ophthalmic solution Place 1 drop into both eyes at bedtime.   Marland Kitchen levothyroxine (SYNTHROID, LEVOTHROID) 75 MCG tablet Take 1 tablet (75 mcg total) by mouth daily before breakfast.  . losartan (COZAAR) 50 MG tablet Take 1 tablet (50 mg total) by mouth daily.   No facility-administered encounter medications on file as of 07/07/2017.     Activities of Daily Living In your present state of health, do you have any difficulty performing the following activities: 10/02/2016  Hearing? N  Vision? N  Difficulty concentrating or making decisions? N  Walking or climbing stairs? N  Dressing or bathing? N  Doing errands, shopping? N  Some recent data might be hidden    Patient Care Team: Colon Branch, MD as PCP - General Monna Fam, MD as Consulting Physician (Ophthalmology) Jacelyn Pi, MD as Consulting Physician (Endocrinology)    Assessment:    Physical assessment deferred to PCP.  Exercise Activities and Dietary recommendations    Diet (meal preparation, eat out, water intake, caffeinated beverages, dairy products, fruits and vegetables): {Desc; diets:16563} Breakfast: Lunch:  Dinner:      Goals    None     Fall Risk Fall Risk  10/02/2016 04/01/2016 03/30/2015 04/04/2013  Falls in the past year? No No No Yes  Number falls in past yr: - - - 1  Injury with Fall? - - - Yes   Depression  Screen PHQ 2/9 Scores 10/02/2016 04/01/2016 03/30/2015 04/04/2013  PHQ - 2 Score 0 0 0 0     Cognitive Function        Immunization History  Administered Date(s) Administered  . Influenza, High Dose Seasonal PF 10/02/2016  . Pneumococcal Conjugate-13 03/30/2015  . Pneumococcal Polysaccharide-23 12/29/2013  . Td 03/26/2010  . Tdap 12/29/2013   Screening Tests Health Maintenance  Topic Date Due  . DEXA SCAN  03/30/2002  . MAMMOGRAM  05/23/2017  . INFLUENZA VACCINE  07/25/2017 (Originally 06/24/2017)  . COLONOSCOPY  09/16/2017  . TETANUS/TDAP  12/30/2023  . PNA vac Low Risk Adult  Completed      Plan:    Follow-up w/ PCP as directed.   I have personally reviewed and noted the following in the patient's chart:   . Medical and social history . Use of alcohol, tobacco or illicit drugs  . Current medications and supplements . Functional ability and status . Nutritional status . Physical activity . Advanced directives . List of other physicians . Vitals . Screenings to include cognitive, depression, and falls . Referrals and appointments  In addition, I have reviewed and discussed with patient certain preventive protocols, quality metrics, and best practice recommendations. A written personalized care plan for preventive services as well as general preventive health recommendations were provided to patient.     Dorrene German, RN  07/07/2017

## 2017-07-16 ENCOUNTER — Ambulatory Visit (HOSPITAL_BASED_OUTPATIENT_CLINIC_OR_DEPARTMENT_OTHER)
Admission: RE | Admit: 2017-07-16 | Discharge: 2017-07-16 | Disposition: A | Discharge status: Home / Self Care | Payer: Medicare HMO | Source: Ambulatory Visit | Attending: Internal Medicine | Admitting: Internal Medicine

## 2017-07-16 DIAGNOSIS — Z1231 Encounter for screening mammogram for malignant neoplasm of breast: Secondary | ICD-10-CM | POA: Diagnosis not present

## 2017-07-24 DIAGNOSIS — Z961 Presence of intraocular lens: Secondary | ICD-10-CM | POA: Diagnosis not present

## 2017-07-24 DIAGNOSIS — H402223 Chronic angle-closure glaucoma, left eye, severe stage: Secondary | ICD-10-CM | POA: Diagnosis not present

## 2017-07-24 DIAGNOSIS — H33102 Unspecified retinoschisis, left eye: Secondary | ICD-10-CM | POA: Diagnosis not present

## 2017-07-24 DIAGNOSIS — H402212 Chronic angle-closure glaucoma, right eye, moderate stage: Secondary | ICD-10-CM | POA: Diagnosis not present

## 2017-07-24 DIAGNOSIS — H35033 Hypertensive retinopathy, bilateral: Secondary | ICD-10-CM | POA: Diagnosis not present

## 2017-07-30 ENCOUNTER — Telehealth: Payer: Self-pay | Admitting: *Deleted

## 2017-07-30 NOTE — Telephone Encounter (Signed)
Received Diabetic Eye Exam results from [facility]; forwarded to provider/SLS 09/06

## 2017-08-26 IMAGING — MG 2D DIGITAL SCREENING BILATERAL MAMMOGRAM WITH CAD AND ADJUNCT TO
9 series · 9 of 25 positions shown · non-contrast
Comparison: Previous exam(s).

CLINICAL DATA: Screening.

EXAM:
2D DIGITAL SCREENING BILATERAL MAMMOGRAM WITH CAD AND ADJUNCT TOMO

[R MLO (1 of 2)]
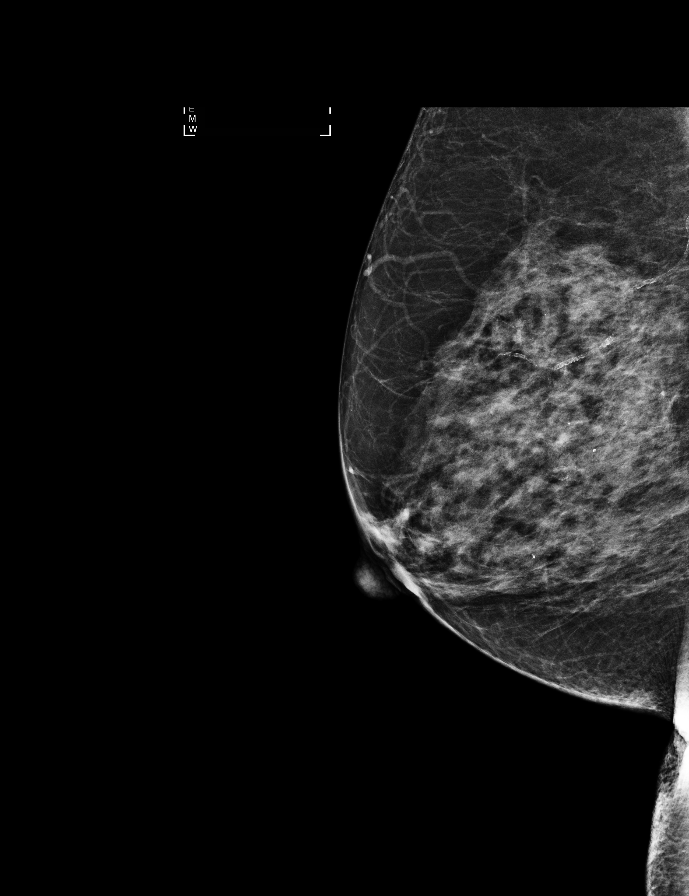

[R MLO (2 of 2)]
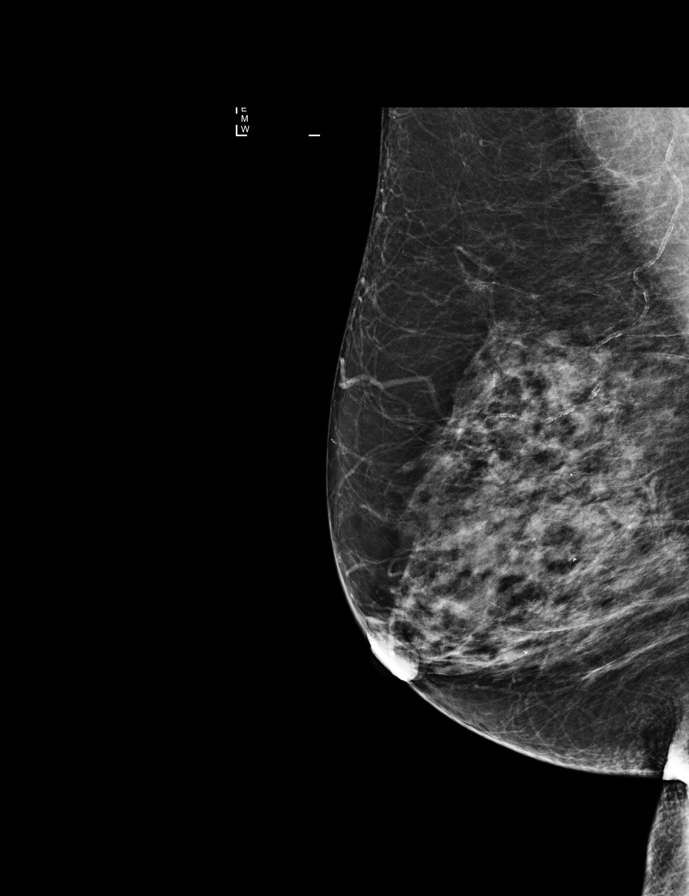

[L CC]
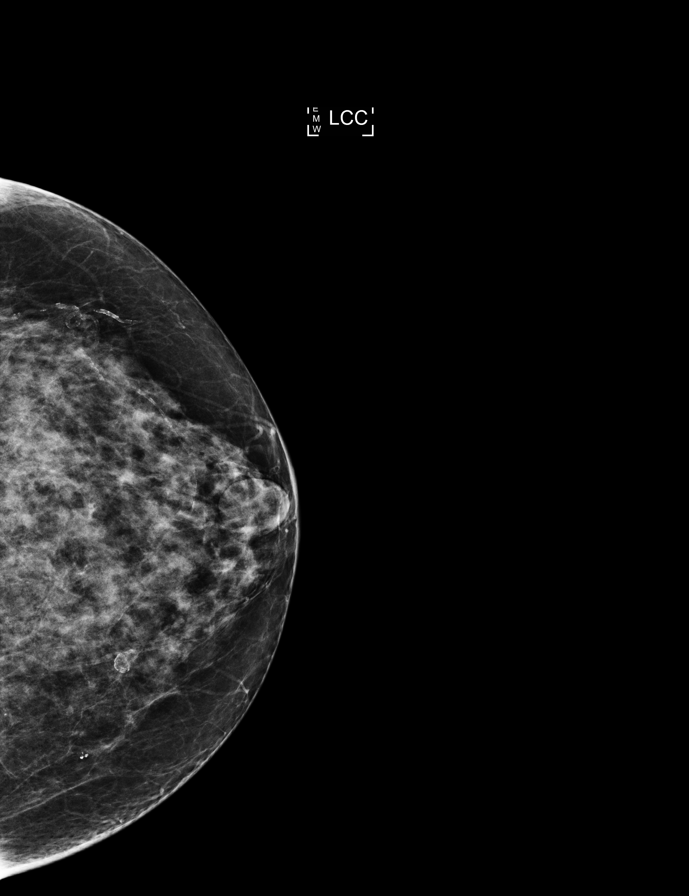

[R CC]
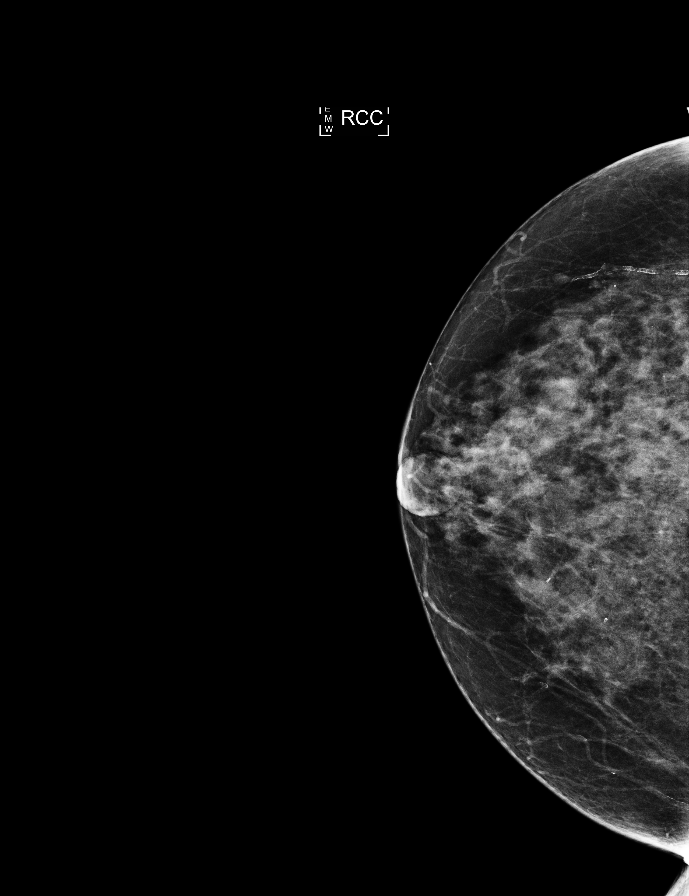

[L MLO]
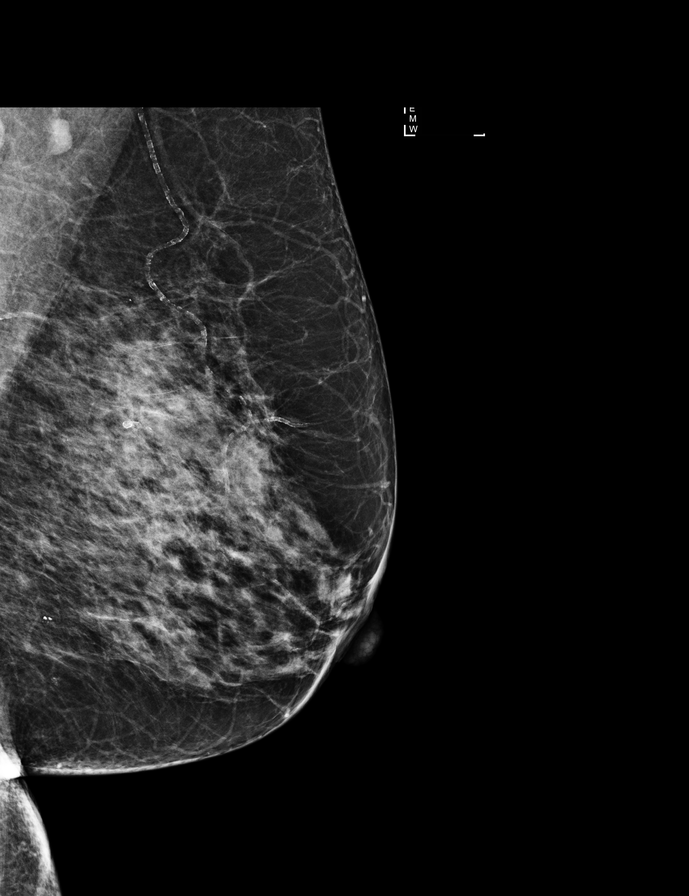

[L CC tomo · tomo slice 25/50.0]
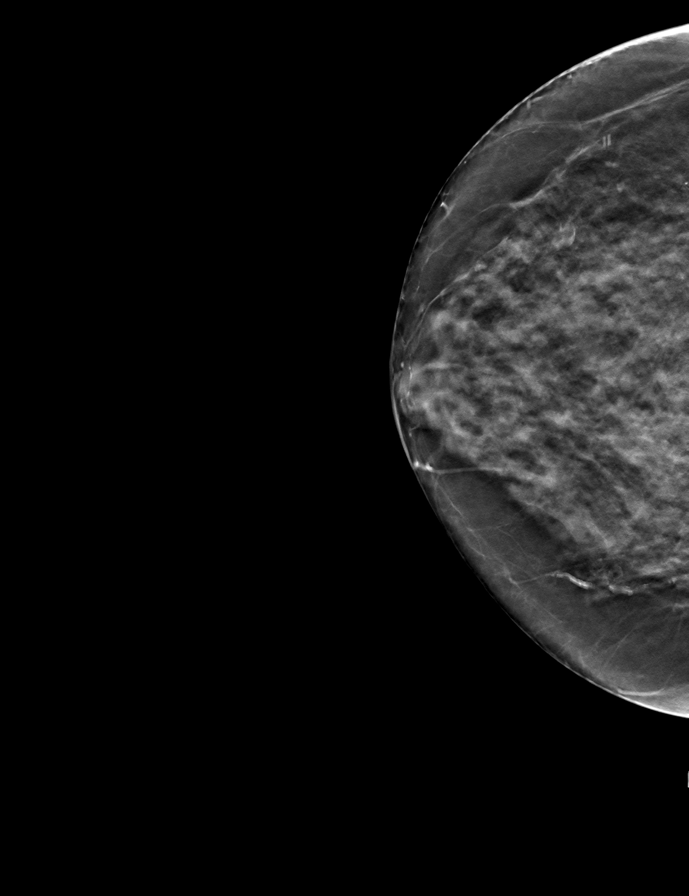

[L MLO tomo · tomo slice 27/54.0]
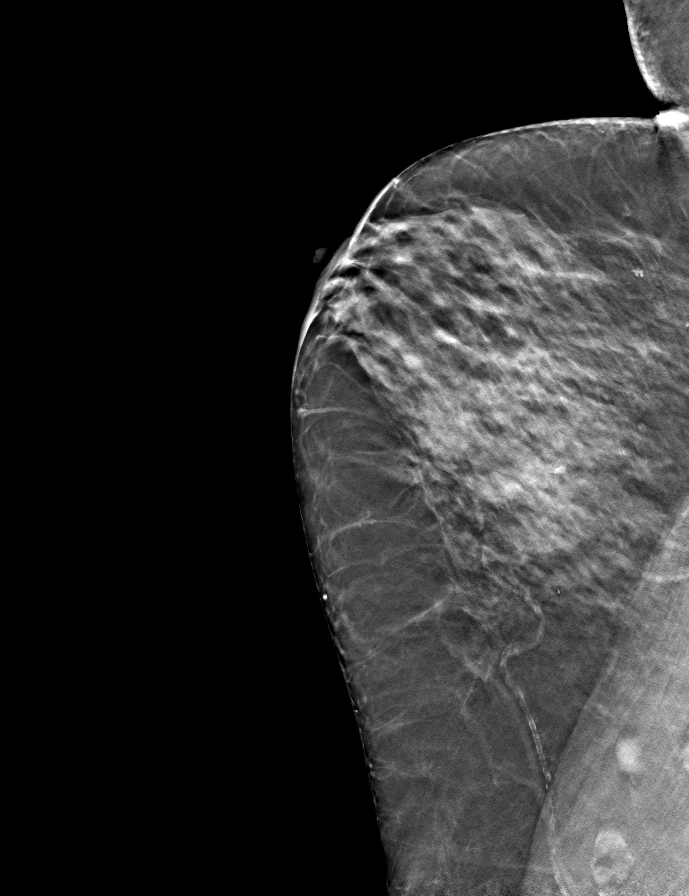

[R MLO tomo · tomo slice 28/55.0]
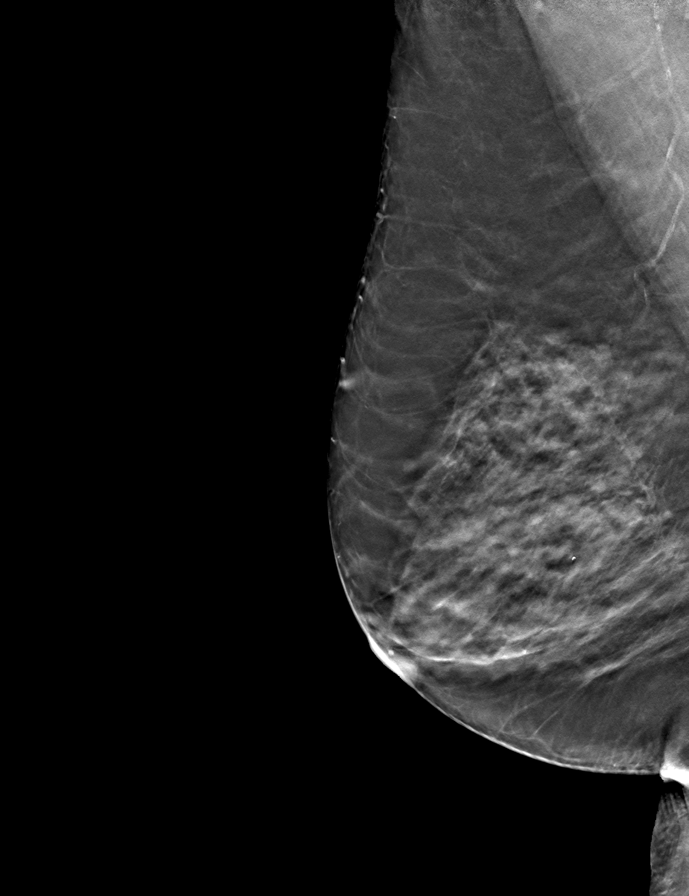

[R CC tomo · tomo slice 23/44.0]
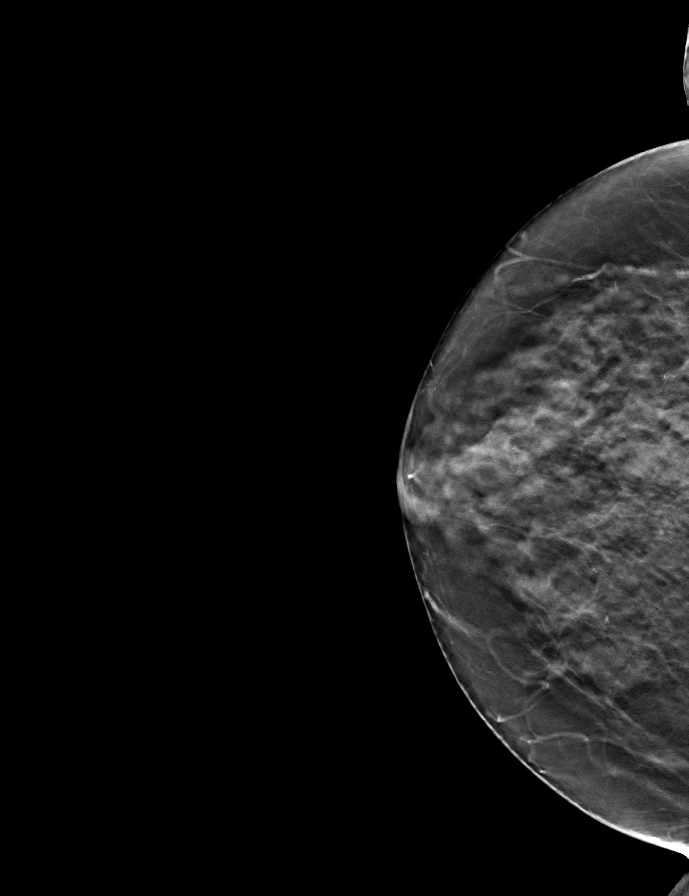

[9 of 25 positions shown; findings below may reference images not displayed]

ACR Breast Density Category c: The breast tissue is heterogeneously
dense, which may obscure small masses.
FINDINGS: There are no findings suspicious for malignancy. Images were
processed with CAD.
IMPRESSION: No mammographic evidence of malignancy. A result letter of this
screening mammogram will be mailed directly to the patient.

RECOMMENDATION:
Screening mammogram in one year. (Code:TN-[id number])

BI-RADS CATEGORY  1: Negative.

## 2017-09-07 ENCOUNTER — Other Ambulatory Visit: Payer: Self-pay | Admitting: Internal Medicine

## 2017-09-09 NOTE — Progress Notes (Addendum)
Subjective:   [name] is a 82 y.o. female who presents for Medicare Annual (Subsequent) preventive examination.  [name] is very pleasant and seems very energetic for her age.  Review of Systems:  No ROS.  Medicare Wellness Visit. Additional risk factors are reflected in the social history.  Cardiac Risk Factors include: advanced age (>13men, >23 women);hypertension Sleep patterns: Sleeps 8-10hrs.   Female:     Mammo-  Last 07/17/17:  normal    Dexa scan-  Pt states she will review info given to her today      CCS- last 09/16/12. Recall 5 yrs.  Objective:     Vitals: BP (!) 160/73 (BP Location: Left Arm, Patient Position: Sitting, Cuff Size: Normal)   Pulse 66   Ht 5\' 5"  (1.651 m)   Wt 145 lb 6.4 oz (66 kg)   SpO2 98%   BMI 24.20 kg/m   Body mass index is 24.2 kg/m.   Tobacco History  Smoking Status  . Never Smoker  Smokeless Tobacco  . Never Used     Counseling given: Not Answered   Past Medical History:  Diagnosis Date  . Abnormal MRI of abdomen    ABNORMAL CT and MRI of pancreas, last MRI 1-09: after d/w GI we rec. to f/u clinically and redo XRs if problems    . Anemia   . CRI (chronic renal insufficiency)    elevated creatinine 4-11: stage 3 CKD, sees nephrology, history of solitary   . Diverticulosis   . Glaucoma   . Gout    h/o   . Hypertension   . Hyperthyroidism    s/p ablation, Dr Debbora Presto   Past Surgical History:  Procedure Laterality Date  . CATARACT EXTRACTION W/ INTRAOCULAR LENS  IMPLANT, BILATERAL  2006  . COLONOSCOPY    . ORIF TIBIA PLATEAU  10/24/2012   Procedure: OPEN REDUCTION INTERNAL FIXATION (ORIF) TIBIAL PLATEAU;  Surgeon: Jessy Oto, MD;  Location: [facility];  Service: Orthopedics;  Laterality: Left;   Family History  Problem Relation Age of Onset  . Heart attack Sister   . Colon cancer Sister        colon  . Cancer Sister   . Heart attack Brother   . Pancreatic cancer Brother   . Diabetes Mother   . Esophageal  cancer Neg Hx   . Stomach cancer Neg Hx   . Rectal cancer Neg Hx   . Breast cancer Neg Hx    History  Sexual Activity  . Sexual activity: Not on file    Outpatient Encounter Prescriptions as of 09/16/2017  Medication Sig  . amLODipine (NORVASC) 10 MG tablet Take 1 tablet (10 mg total) by mouth daily.  Marland Kitchen aspirin 81 MG tablet Take 81 mg by mouth daily.    Marland Kitchen latanoprost (XALATAN) 0.005 % ophthalmic solution Place 1 drop into both eyes at bedtime.   Marland Kitchen levothyroxine (SYNTHROID, LEVOTHROID) 75 MCG tablet Take 1 tablet (75 mcg total) by mouth daily before breakfast.  . losartan (COZAAR) 50 MG tablet Take 1 tablet (50 mg total) by mouth daily.   No facility-administered encounter medications on file as of 09/16/2017.     Activities of Daily Living In your present state of health, do you have any difficulty performing the following activities: 09/16/2017 10/02/2016  Hearing? N N  Vision? N N  Comment wearing glasses. Dr.Hecker yearly. -  Difficulty concentrating or making decisions? N N  Walking or climbing stairs? N N  Dressing  or bathing? N N  Doing errands, shopping? N N  Preparing Food and eating ? N -  Using the Toilet? N -  In the past six months, have you accidently leaked urine? N -  Do you have problems with loss of bowel control? N -  Managing your Medications? N -  Managing your Finances? N -  Housekeeping or managing your Housekeeping? N -  Some recent data might be hidden    Patient Care Team: Colon Branch, MD as PCP - General Monna Fam, MD as Consulting Physician (Ophthalmology) Jacelyn Pi, MD as Consulting Physician (Endocrinology)    Assessment:    Physical assessment deferred to PCP.  Exercise Activities and Dietary recommendations Current Exercise Habits: Structured exercise class, Type of exercise: walking, Time (Minutes): 60, Frequency (Times/Week): 2, Weekly Exercise (Minutes/Week): 120 Diet (meal preparation, eat out, water intake, caffeinated  beverages, dairy products, fruits and vegetables): well balanced   Goals    . Maintain current health (pt-stated)      Fall Risk Fall Risk  09/16/2017 10/02/2016 04/01/2016 03/30/2015 04/04/2013  Falls in the past year? No No No No Yes  Number falls in past yr: - - - - 1  Injury with Fall? - - - - Yes   Depression Screen PHQ 2/9 Scores 09/16/2017 10/02/2016 04/01/2016 03/30/2015  PHQ - 2 Score 0 0 0 0     Cognitive Function MMSE - Mini Mental State Exam 09/16/2017  Orientation to time 5  Orientation to Place 5  Registration 3  Attention/ Calculation 5  Recall 2  Language- name 2 objects 2  Language- repeat 1  Language- follow 3 step command 3  Language- read & follow direction 1  Write a sentence 1  Copy design 1  Total score 29        Immunization History  Administered Date(s) Administered  . Influenza, High Dose Seasonal PF 10/02/2016  . Pneumococcal Conjugate-13 03/30/2015  . Pneumococcal Polysaccharide-23 12/29/2013  . Td 03/26/2010  . Tdap 12/29/2013   Screening Tests Health Maintenance  Topic Date Due  . INFLUENZA VACCINE  06/24/2017  . COLONOSCOPY  09/16/2017  . DEXA SCAN  07/07/2018 (Originally 03/30/2002)  . MAMMOGRAM  07/16/2018  . TETANUS/TDAP  12/30/2023  . PNA vac Low Risk Adult  Completed      Plan:   Follow up with Dr.Paz 05/07/18 as scheduled.  Continue to eat heart healthy diet (full of fruits, vegetables, whole grains, lean protein, water--limit salt, fat, and sugar intake) and increase physical activity as tolerated.  Continue doing brain stimulating activities (puzzles, reading, adult coloring books, staying active) to keep memory sharp.   You received your flu shot today!   I have personally reviewed and noted the following in the patient's chart:   . Medical and social history . Use of alcohol, tobacco or illicit drugs  . Current medications and supplements . Functional ability and status . Nutritional status . Physical  activity . Advanced directives . List of other physicians . Hospitalizations, surgeries, and ER visits in previous 12 months . Vitals . Screenings to include cognitive, depression, and falls . Referrals and appointments  In addition, I have reviewed and discussed with patient certain preventive protocols, quality metrics, and best practice recommendations. A written personalized care plan for preventive services as well as general preventive health recommendations were provided to patient.     Naaman Plummer [place], [place]  09/16/2017  Kathlene November, MD

## 2017-09-16 ENCOUNTER — Ambulatory Visit (INDEPENDENT_AMBULATORY_CARE_PROVIDER_SITE_OTHER): Payer: Medicare HMO | Admitting: *Deleted

## 2017-09-16 ENCOUNTER — Encounter: Payer: Self-pay | Admitting: *Deleted

## 2017-09-16 VITALS — BP 142/68 | HR 66 | Ht 65.0 in | Wt 145.4 lb

## 2017-09-16 DIAGNOSIS — Z23 Encounter for immunization: Secondary | ICD-10-CM

## 2017-09-16 DIAGNOSIS — Z0000 Encounter for general adult medical examination without abnormal findings: Secondary | ICD-10-CM | POA: Diagnosis not present

## 2017-09-16 NOTE — Patient Instructions (Signed)
[name] , Thank you for taking time to come for your Medicare Wellness Visit. I appreciate your ongoing commitment to your health goals. Please review the following plan we discussed and let me know if I can assist you in the future.   These are the goals we discussed: Goals    . Maintain current health (pt-stated)       This is a list of the screening recommended for you and due dates:  Health Maintenance  Topic Date Due  . Flu Shot  06/24/2017  . Colon Cancer Screening  09/16/2017  . DEXA scan (bone density measurement)  07/07/2018*  . Mammogram  07/16/2018  . Tetanus Vaccine  12/30/2023  . Pneumonia vaccines  Completed  *Topic was postponed. The date shown is not the original due date.    Follow up with Dr.Paz 05/07/18 as scheduled.  Continue to eat heart healthy diet (full of fruits, vegetables, whole grains, lean protein, water--limit salt, fat, and sugar intake) and increase physical activity as tolerated.  Continue doing brain stimulating activities (puzzles, reading, adult coloring books, staying active) to keep memory sharp.   You received your flu shot today!  Bone Densitometry Bone densitometry is an imaging test that uses a special X-ray to measure the amount of calcium and other minerals in your bones (bone density). This test is also known as a bone mineral density test or dual-energy X-ray absorptiometry (DXA). The test can measure bone density at your hip and your spine. It is similar to having a regular X-ray. You may have this test to:  Diagnose a condition that causes weak or thin bones (osteoporosis).  Predict your risk of a broken bone (fracture).  Determine how well osteoporosis treatment is working.  Tell a health care provider about:  Any allergies you have.  All medicines you are taking, including vitamins, herbs, eye drops, creams, and over-the-counter medicines.  Any problems you or family members have had with anesthetic medicines.  Any blood  disorders you have.  Any surgeries you have had.  Any medical conditions you have.  Possibility of pregnancy.  Any other medical test you had within the previous 14 days that used contrast material. What are the risks? Generally, this is a safe procedure. However, problems can occur and may include the following:  This test exposes you to a very small amount of radiation.  The risks of radiation exposure may be greater to unborn children.  What happens before the procedure?  Do not take any calcium supplements for 24 hours before having the test. You can otherwise eat and drink what you usually do.  Take off all metal jewelry, eyeglasses, dental appliances, and any other metal objects. What happens during the procedure?  You may lie on an exam table. There will be an X-ray generator below you and an imaging device above you.  Other devices, such as boxes or braces, may be used to position your body properly for the scan.  You will need to lie still while the machine slowly scans your body.  The images will show up on a computer monitor. What happens after the procedure? You may need more testing at a later time. This information is not intended to replace advice given to you by your health care provider. Make sure you discuss any questions you have with your health care provider. Document Released: 12/02/2004 Document Revised: 04/17/2016 Document Reviewed: 04/20/2014 Elsevier Interactive Patient Education  2018 [place] Maintenance for Postmenopausal Women  Menopause is a normal process in which your reproductive ability comes to an end. This process happens gradually over a span of months to years, usually between the ages of 49 and 86. Menopause is complete when you have missed 12 consecutive menstrual periods. It is important to talk with your health care provider about some of the most common conditions that affect postmenopausal women, such as heart disease,  cancer, and bone loss (osteoporosis). Adopting a healthy lifestyle and getting preventive care can help to promote your health and wellness. Those actions can also lower your chances of developing some of these common conditions. What should I know about menopause? During menopause, you may experience a number of symptoms, such as:  Moderate-to-severe hot flashes.  Night sweats.  Decrease in sex drive.  Mood swings.  Headaches.  Tiredness.  Irritability.  Memory problems.  Insomnia.  Choosing to treat or not to treat menopausal changes is an individual decision that you make with your health care provider. What should I know about hormone replacement therapy and supplements? Hormone therapy products are effective for treating symptoms that are associated with menopause, such as hot flashes and night sweats. Hormone replacement carries certain risks, especially as you become older. If you are thinking about using estrogen or estrogen with progestin treatments, discuss the benefits and risks with your health care provider. What should I know about heart disease and stroke? Heart disease, heart attack, and stroke become more likely as you age. This may be due, in part, to the hormonal changes that your body experiences during menopause. These can affect how your body processes dietary fats, triglycerides, and cholesterol. Heart attack and stroke are both medical emergencies. There are many things that you can do to help prevent heart disease and stroke:  Have your blood pressure checked at least every 1-2 years. High blood pressure causes heart disease and increases the risk of stroke.  If you are 39-67 years old, ask your health care provider if you should take aspirin to prevent a heart attack or a stroke.  Do not use any tobacco products, including cigarettes, chewing tobacco, or electronic cigarettes. If you need help quitting, ask your health care provider.  It is important to  eat a healthy diet and maintain a healthy weight. ? Be sure to include plenty of vegetables, fruits, low-fat dairy products, and lean protein. ? Avoid eating foods that are high in solid fats, added sugars, or salt (sodium).  Get regular exercise. This is one of the most important things that you can do for your health. ? Try to exercise for at least 150 minutes each week. The type of exercise that you do should increase your heart rate and make you sweat. This is known as moderate-intensity exercise. ? Try to do strengthening exercises at least twice each week. Do these in addition to the moderate-intensity exercise.  Know your numbers.Ask your health care provider to check your cholesterol and your blood glucose. Continue to have your blood tested as directed by your health care provider.  What should I know about cancer screening? There are several types of cancer. Take the following steps to reduce your risk and to catch any cancer development as early as possible. Breast Cancer  Practice breast self-awareness. ? This means understanding how your breasts normally appear and feel. ? It also means doing regular breast self-exams. Let your health care provider know about any changes, no matter how small.  If you are 40 or older, have a  clinician do a breast exam (clinical breast exam or CBE) every year. Depending on your age, family history, and medical history, it may be recommended that you also have a yearly breast X-ray (mammogram).  If you have a family history of breast cancer, talk with your health care provider about genetic screening.  If you are at high risk for breast cancer, talk with your health care provider about having an MRI and a mammogram every year.  Breast cancer (BRCA) gene test is recommended for women who have family members with BRCA-related cancers. Results of the assessment will determine the need for genetic counseling and BRCA1 and for BRCA2 testing.  BRCA-related cancers include these types: ? Breast. This occurs in males or females. ? Ovarian. ? Tubal. This may also be called fallopian tube cancer. ? Cancer of the abdominal or pelvic lining (peritoneal cancer). ? Prostate. ? Pancreatic.  Cervical, Uterine, and Ovarian Cancer Your health care provider may recommend that you be screened regularly for cancer of the pelvic organs. These include your ovaries, uterus, and vagina. This screening involves a pelvic exam, which includes checking for microscopic changes to the surface of your cervix (Pap test).  For women ages 21-65, health care providers may recommend a pelvic exam and a Pap test every three years. For women ages 84-65, they may recommend the Pap test and pelvic exam, combined with testing for human papilloma virus (HPV), every five years. Some types of HPV increase your risk of cervical cancer. Testing for HPV may also be done on women of any age who have unclear Pap test results.  Other health care providers may not recommend any screening for nonpregnant women who are considered low risk for pelvic cancer and have no symptoms. Ask your health care provider if a screening pelvic exam is right for you.  If you have had past treatment for cervical cancer or a condition that could lead to cancer, you need Pap tests and screening for cancer for at least 20 years after your treatment. If Pap tests have been discontinued for you, your risk factors (such as having a new sexual partner) need to be reassessed to determine if you should start having screenings again. Some women have medical problems that increase the chance of getting cervical cancer. In these cases, your health care provider may recommend that you have screening and Pap tests more often.  If you have a family history of uterine cancer or ovarian cancer, talk with your health care provider about genetic screening.  If you have vaginal bleeding after reaching menopause, tell  your health care provider.  There are currently no reliable tests available to screen for ovarian cancer.  Lung Cancer Lung cancer screening is recommended for adults 45-18 years old who are at high risk for lung cancer because of a history of smoking. A yearly low-dose CT scan of the lungs is recommended if you:  Currently smoke.  Have a history of at least 30 pack-years of smoking and you currently smoke or have quit within the past 15 years. A pack-year is smoking an average of one pack of cigarettes per day for one year.  Yearly screening should:  Continue until it has been 15 years since you quit.  Stop if you develop a health problem that would prevent you from having lung cancer treatment.  Colorectal Cancer  This type of cancer can be detected and can often be prevented.  Routine colorectal cancer screening usually begins at age 37 and continues  through age 78.  If you have risk factors for colon cancer, your health care provider may recommend that you be screened at an earlier age.  If you have a family history of colorectal cancer, talk with your health care provider about genetic screening.  Your health care provider may also recommend using home test kits to check for hidden blood in your stool.  A small camera at the end of a tube can be used to examine your colon directly (sigmoidoscopy or colonoscopy). This is done to check for the earliest forms of colorectal cancer.  Direct examination of the colon should be repeated every 5-10 years until age 36. However, if early forms of precancerous polyps or small growths are found or if you have a family history or genetic risk for colorectal cancer, you may need to be screened more often.  Skin Cancer  Check your skin from head to toe regularly.  Monitor any moles. Be sure to tell your health care provider: ? About any new moles or changes in moles, especially if there is a change in a mole's shape or color. ? If you  have a mole that is larger than the size of a pencil eraser.  If any of your family members has a history of skin cancer, especially at a young age, talk with your health care provider about genetic screening.  Always use sunscreen. Apply sunscreen liberally and repeatedly throughout the day.  Whenever you are outside, protect yourself by wearing Heiser sleeves, pants, a wide-brimmed hat, and sunglasses.  What should I know about osteoporosis? Osteoporosis is a condition in which bone destruction happens more quickly than new bone creation. After menopause, you may be at an increased risk for osteoporosis. To help prevent osteoporosis or the bone fractures that can happen because of osteoporosis, the following is recommended:  If you are 35-41 years old, get at least 1,000 mg of calcium and at least 600 mg of vitamin D per day.  If you are older than age 77 but younger than age 79, get at least 1,200 mg of calcium and at least 600 mg of vitamin D per day.  If you are older than age 56, get at least 1,200 mg of calcium and at least 800 mg of vitamin D per day.  Smoking and excessive alcohol intake increase the risk of osteoporosis. Eat foods that are rich in calcium and vitamin D, and do weight-bearing exercises several times each week as directed by your health care provider. What should I know about how menopause affects my mental health? Depression may occur at any age, but it is more common as you become older. Common symptoms of depression include:  Low or sad mood.  Changes in sleep patterns.  Changes in appetite or eating patterns.  Feeling an overall lack of motivation or enjoyment of activities that you previously enjoyed.  Frequent crying spells.  Talk with your health care provider if you think that you are experiencing depression. What should I know about immunizations? It is important that you get and maintain your immunizations. These include:  Tetanus, diphtheria, and  pertussis (Tdap) booster vaccine.  Influenza every year before the flu season begins.  Pneumonia vaccine.  Shingles vaccine.  Your health care provider may also recommend other immunizations. This information is not intended to replace advice given to you by your health care provider. Make sure you discuss any questions you have with your health care provider. Document Released: 01/02/2006 Document Revised: 05/30/2016 Document  Reviewed: 08/14/2015 Elsevier Interactive Patient Education  [employer].

## 2017-09-22 ENCOUNTER — Encounter: Payer: Self-pay | Admitting: Internal Medicine

## 2017-09-28 ENCOUNTER — Telehealth: Payer: Self-pay | Admitting: Internal Medicine

## 2017-09-28 MED ORDER — LOSARTAN POTASSIUM 50 MG PO TABS: 50.0000 mg | ORAL_TABLET | Freq: Every day | ORAL | 1 refills | Status: DC

## 2017-09-28 NOTE — Telephone Encounter (Signed)
°  [id number] - [place], [place].    [facility] losartan (COZAAR) 50 MG - she is complete out. Advised patient to call sooner for refills.

## 2017-09-28 NOTE — Telephone Encounter (Signed)
Rx sent 

## 2017-10-26 ENCOUNTER — Telehealth: Payer: Self-pay | Admitting: Internal Medicine

## 2017-10-26 NOTE — Telephone Encounter (Signed)
Copied from [place] [id number]. Topic: Quick Communication - Rx Refill/Question >> Oct 26, 2017  2:12 PM Lennox Solders wrote: Has the patient contacted their pharmacy? yes (Agent: If no, request that the patient contact the pharmacy for the refill.) Preferred Pharmacy (with phone number or street name): [facility] [phone]. Pt needs levothyroxine 75 mcg #90 w/refills Agent: Please be advised that RX refills may take up to 48 hours. We ask that you follow-up with your pharmacy.

## 2017-10-27 ENCOUNTER — Other Ambulatory Visit: Payer: Self-pay | Admitting: *Deleted

## 2017-10-27 DIAGNOSIS — E039 Hypothyroidism, unspecified: Secondary | ICD-10-CM

## 2017-10-27 MED ORDER — LEVOTHYROXINE SODIUM 75 MCG PO TABS: 75.0000 ug | ORAL_TABLET | Freq: Every day | ORAL | 1 refills | Status: DC

## 2017-10-27 NOTE — Telephone Encounter (Signed)
Patient was seen for AEX 8/14- current with appointments

## 2018-01-19 ENCOUNTER — Encounter: Payer: Self-pay | Admitting: Internal Medicine

## 2018-01-19 ENCOUNTER — Ambulatory Visit: Payer: Medicare HMO | Admitting: Internal Medicine

## 2018-01-19 VITALS — BP 122/76 | HR 71 | Temp 98.1°F | Resp 14 | Ht 65.0 in | Wt 141.2 lb

## 2018-01-19 DIAGNOSIS — M25561 Pain in right knee: Secondary | ICD-10-CM

## 2018-01-19 DIAGNOSIS — J40 Bronchitis, not specified as acute or chronic: Secondary | ICD-10-CM

## 2018-01-19 DIAGNOSIS — E039 Hypothyroidism, unspecified: Secondary | ICD-10-CM

## 2018-01-19 DIAGNOSIS — M109 Gout, unspecified: Secondary | ICD-10-CM

## 2018-01-19 MED ORDER — BENZONATATE 100 MG PO CAPS: 100.0000 mg | ORAL_CAPSULE | Freq: Three times a day (TID) | ORAL | 0 refills | Status: DC | PRN

## 2018-01-19 MED ORDER — AZITHROMYCIN 250 MG PO TABS: ORAL_TABLET | ORAL | 0 refills | Status: DC

## 2018-01-19 MED ORDER — PREDNISONE 10 MG PO TABS: ORAL_TABLET | ORAL | 0 refills | Status: DC

## 2018-01-19 NOTE — Progress Notes (Signed)
Pre visit review using our clinic review tool, if applicable. No additional management support is needed unless otherwise documented below in the visit note. 

## 2018-01-19 NOTE — Patient Instructions (Addendum)
Rest, fluids , tylenol  For cough:  Take Robitussin-DM better Tessalon Perles as needed   Avoid decongestants such as  Pseudoephedrine or phenylephrine   Prednisone as prescribed  Take the antibiotic as prescribed  (Zithromax )  Call if not gradually better over the next  10 days  Call anytime if the symptoms are severe  ICE the knee twice a day

## 2018-01-19 NOTE — Progress Notes (Signed)
Subjective:    Patient ID: [name], female    DOB: 10-28-1935, 83 y.o.   MRN: [id number]  DOS:  01/19/2018 Type of visit - description : acute, 2 issues Interval history: 2 weeks history of cough, persistent, constant and bothersome.  Tried Mucinex  for several days but stopped because it was not helping. Also, sudden onset of right knee pain yesterday.  She did not notice any swelling or redness but on exam it is swollen.  Denies any injury. She has a history of gout, the problem  has not been active in a while, when she has an attack is usually in the feet.  Review of Systems No fevers or chills No nausea, vomiting, no generalized myalgias No sputum production Sinuses with minimal congestion   Past Medical History:  Diagnosis Date  . Abnormal MRI of abdomen    ABNORMAL CT and MRI of pancreas, last MRI 1-09: after d/w GI we rec. to f/u clinically and redo XRs if problems    . Anemia   . CRI (chronic renal insufficiency)    elevated creatinine 4-11: stage 3 CKD, sees nephrology, history of solitary   . Diverticulosis   . Glaucoma   . Gout    h/o   . Hypertension   . Hyperthyroidism    s/p ablation, Dr Debbora Presto    Past Surgical History:  Procedure Laterality Date  . CATARACT EXTRACTION W/ INTRAOCULAR LENS  IMPLANT, BILATERAL  2006  . COLONOSCOPY    . ORIF TIBIA PLATEAU  10/24/2012   Procedure: OPEN REDUCTION INTERNAL FIXATION (ORIF) TIBIAL PLATEAU;  Surgeon: Jessy Oto, MD;  Location: [facility];  Service: Orthopedics;  Laterality: Left;    Social History   Socioeconomic History  . Marital status: Legally Separated    Spouse name: Not on file  . Number of children: 1  . Years of education: Not on file  . Highest education level: Not on file  Social Needs  . Financial resource strain: Not on file  . Food insecurity - worry: Not on file  . Food insecurity - inability: Not on file  . Transportation needs - medical: Not on file  . Transportation needs -  non-medical: Not on file  Occupational History  . Occupation: retired , Scientist, water quality   Tobacco Use  . Smoking status: Never Smoker  . Smokeless tobacco: Never Used  Substance and Sexual Activity  . Alcohol use: No  . Drug use: No  . Sexual activity: Not on file  Other Topics Concern  . Not on file  Social History Narrative   Divorced, lives with her sister     Has one daughter who lives here in town      Allergies as of 01/19/2018   No Known Allergies     Medication List        Accurate as of 01/19/18 11:59 PM. Always use your most recent med list.          amLODipine 10 MG tablet Commonly known as:  NORVASC Take 1 tablet (10 mg total) by mouth daily.   aspirin 81 MG tablet Take 81 mg by mouth daily.   azithromycin 250 MG tablet Commonly known as:  ZITHROMAX Z-PAK 2 tabs a day the first day, then 1 tab a day x 4 days   benzonatate 100 MG capsule Commonly known as:  TESSALON PERLES Take 1-2 capsules (100-200 mg total) by mouth 3 (three) times daily as needed for cough.   latanoprost 0.005 %  ophthalmic solution Commonly known as:  XALATAN Place 1 drop into both eyes at bedtime.   levothyroxine 75 MCG tablet Commonly known as:  SYNTHROID, LEVOTHROID Take 1 tablet (75 mcg total) by mouth daily before breakfast.   losartan 50 MG tablet Commonly known as:  COZAAR Take 1 tablet (50 mg total) daily by mouth.   predniSONE 10 MG tablet Commonly known as:  DELTASONE 3 tabs x 2 days, 2 tabs x 2 days, 1 tab x 2 days          Objective:   Physical Exam BP 122/76 (BP Location: Left Arm, Patient Position: Sitting, Cuff Size: Small)   Pulse 71   Temp 98.1 F (36.7 C) (Oral)   Resp 14   Ht 5\' 5"  (1.651 m)   Wt 141 lb 4 oz (64.1 kg)   SpO2 97%   BMI 23.51 kg/m  General:   Well developed, well nourished.  No acute distress, nontoxic appearing.  HEENT:  Normocephalic . Face symmetric, atraumatic Nose congested, sinuses no TTP, TMs normal Lungs:  CTA B.  Few  rhonchi with cough only.  No wheezing Normal respiratory effort, no intercostal retractions, no accessory muscle use. Heart: RRR,  no murmur.  No pretibial edema bilaterally  MSK: Left knee: Status post surgery, no swelling, redness.  R OM normal Right knee: Mild effusion, slightly warm, not red, very painful with passive range of motion. Skin: Not pale. Not jaundice Neurologic:  alert & oriented X3.  Speech normal, gait very limited due to knee pain, uses a cane, needs help with transferring. Psych--  Cognition and judgment appear intact.  Cooperative with normal attention span and concentration.  Behavior appropriate. No anxious or depressed appearing.      Assessment & Plan:   Assessment HTN CKD, stage III, Single kidney (absent L one); sees nephrology prn, last visit 2014. (Creatinine 1.8 = clearance ~ 30) Hypothyroidism:  s/p ablation, Dr. Chalmers Cater, no f/u schedule  Glaucoma H/o Gout H/o abnormal MRI abdomen , last MRI 2009, saw GI ---> follow up clinically, redo MRI prn  PLAN: Bronchitis: Zithromax.  See AVS R knee pain: Acute onset, started yesterday, + small  effusion on exam.  DDX: DJD flareup, gout, doubt septic but it is in the ddx.  Will ask sports medicine to see, consider diagnostic/therapeutic tap. Addendum: Will cancel referral, patient will be quite reluctant to let them do a tap.  We agreed to monitor the area and call if worse, more swelling, fever or chills. Also rx prednisone for possible gout

## 2018-01-20 NOTE — Assessment & Plan Note (Signed)
Bronchitis: Zithromax.  See AVS R knee pain: Acute onset, started yesterday, + small  effusion on exam.  DDX: DJD flareup, gout, doubt septic but it is in the ddx.  Will ask sports medicine to see, consider diagnostic/therapeutic tap. Addendum: Will cancel referral, patient will be quite reluctant to let them do a tap.  We agreed to monitor the area and call if worse, more swelling, fever or chills. Also rx prednisone for possible gout

## 2018-02-08 DIAGNOSIS — H402223 Chronic angle-closure glaucoma, left eye, severe stage: Secondary | ICD-10-CM | POA: Diagnosis not present

## 2018-02-08 DIAGNOSIS — H402212 Chronic angle-closure glaucoma, right eye, moderate stage: Secondary | ICD-10-CM | POA: Diagnosis not present

## 2018-03-15 ENCOUNTER — Other Ambulatory Visit: Payer: Self-pay | Admitting: Internal Medicine

## 2018-03-15 DIAGNOSIS — E039 Hypothyroidism, unspecified: Secondary | ICD-10-CM

## 2018-03-24 ENCOUNTER — Other Ambulatory Visit: Payer: Self-pay | Admitting: Internal Medicine

## 2018-04-02 ENCOUNTER — Other Ambulatory Visit: Payer: Self-pay | Admitting: Internal Medicine

## 2018-04-12 ENCOUNTER — Other Ambulatory Visit: Payer: Self-pay | Admitting: Internal Medicine

## 2018-05-07 ENCOUNTER — Ambulatory Visit (INDEPENDENT_AMBULATORY_CARE_PROVIDER_SITE_OTHER): Payer: Medicare HMO | Admitting: Internal Medicine

## 2018-05-07 ENCOUNTER — Encounter: Payer: Self-pay | Admitting: Internal Medicine

## 2018-05-07 VITALS — BP 126/78 | HR 83 | Temp 97.3°F | Resp 14 | Ht 65.0 in | Wt 136.5 lb

## 2018-05-07 DIAGNOSIS — M79673 Pain in unspecified foot: Secondary | ICD-10-CM | POA: Diagnosis not present

## 2018-05-07 DIAGNOSIS — E039 Hypothyroidism, unspecified: Secondary | ICD-10-CM

## 2018-05-07 DIAGNOSIS — I10 Essential (primary) hypertension: Secondary | ICD-10-CM

## 2018-05-07 DIAGNOSIS — N183 Chronic kidney disease, stage 3 unspecified: Secondary | ICD-10-CM

## 2018-05-07 DIAGNOSIS — R634 Abnormal weight loss: Secondary | ICD-10-CM

## 2018-05-07 LAB — COMPREHENSIVE METABOLIC PANEL
ALK PHOS: 95 U/L (ref 39–117)
ALT: 7 U/L (ref 0–35)
AST: 12 U/L (ref 0–37)
Albumin: 3.9 g/dL (ref 3.5–5.2)
BILIRUBIN TOTAL: 0.6 mg/dL (ref 0.2–1.2)
BUN: 27 mg/dL — ABNORMAL HIGH (ref 6–23)
CALCIUM: 10 mg/dL (ref 8.4–10.5)
CO2: 25 mEq/L (ref 19–32)
CREATININE: 1.8 mg/dL — AB (ref 0.40–1.20)
Chloride: 106 mEq/L (ref 96–112)
GFR: 34.72 mL/min — ABNORMAL LOW (ref >60.00)
GLUCOSE: 77 mg/dL (ref 70–99)
Potassium: 4.9 mEq/L (ref 3.5–5.1)
Sodium: 139 mEq/L (ref 135–145)
TOTAL PROTEIN: 7.6 g/dL (ref 6.0–8.3)

## 2018-05-07 LAB — CBC WITH DIFFERENTIAL/PLATELET
BASOS ABS: 0.1 10*3/uL (ref 0.0–0.1)
Basophils Relative: 1.5 % (ref 0.0–3.0)
EOS ABS: 0.1 10*3/uL (ref 0.0–0.7)
Eosinophils Relative: 2.4 % (ref 0.0–5.0)
HCT: 33.4 % — ABNORMAL LOW (ref 36.0–46.0)
Hemoglobin: 11.1 g/dL — ABNORMAL LOW (ref 12.0–15.0)
LYMPHS ABS: 1.6 10*3/uL (ref 0.7–4.0)
LYMPHS PCT: 26.6 % (ref 12.0–46.0)
MCHC: 33.3 g/dL (ref 30.0–36.0)
MCV: 90.3 fl (ref 78.0–100.0)
Monocytes Absolute: 0.4 10*3/uL (ref 0.1–1.0)
Monocytes Relative: 7.2 % (ref 3.0–12.0)
NEUTROS ABS: 3.7 10*3/uL (ref 1.4–7.7)
NEUTROS PCT: 62.3 % (ref 43.0–77.0)
PLATELETS: 420 10*3/uL — AB (ref 150.0–400.0)
RBC: 3.71 Mil/uL — AB (ref 3.87–5.11)
RDW: 15.5 % (ref 11.5–15.5)
WBC: 6 10*3/uL (ref 4.0–10.5)

## 2018-05-07 LAB — TSH: TSH: 0.56 u[IU]/mL (ref 0.35–4.50)

## 2018-05-07 NOTE — Progress Notes (Signed)
Subjective:    Patient ID: [name], female    DOB: 08/09/1935, 83 y.o.   MRN: [id number]  DOS:  05/07/2018 Type of visit - description : Routine follow-up Interval history: In general feels well. Good compliance of medication. Has noted some weight loss over the last few weeks, not on purpose. Denies any fever, chills. Appetite is normal.  No nausea, vomiting, diarrhea or change in her bowel habits. No depression.  No tremors.  Also, had noted a discoloration at the right side of the back, around a month ago, denies any pain, bleeding or discharge.  Also reports right-sided heel pain, mostly when she stands up.  Wt Readings from Last 3 Encounters:  05/07/18 136 lb 8 oz (61.9 kg)  01/19/18 141 lb 4 oz (64.1 kg)  09/16/17 145 lb 6.4 oz (66 kg)     Review of Systems See above   Past Medical History:  Diagnosis Date  . Abnormal MRI of abdomen    ABNORMAL CT and MRI of pancreas, last MRI 1-09: after d/w GI we rec. to f/u clinically and redo XRs if problems    . Anemia   . CRI (chronic renal insufficiency)    elevated creatinine 4-11: stage 3 CKD, sees nephrology, history of solitary   . Diverticulosis   . Glaucoma   . Gout    h/o   . Hypertension   . Hyperthyroidism    s/p ablation, Dr Debbora Presto    Past Surgical History:  Procedure Laterality Date  . CATARACT EXTRACTION W/ INTRAOCULAR LENS  IMPLANT, BILATERAL  2006  . COLONOSCOPY    . ORIF TIBIA PLATEAU  10/24/2012   Procedure: OPEN REDUCTION INTERNAL FIXATION (ORIF) TIBIAL PLATEAU;  Surgeon: Jessy Oto, MD;  Location: [facility];  Service: Orthopedics;  Laterality: Left;    Social History   Socioeconomic History  . Marital status: Legally Separated    Spouse name: Not on file  . Number of children: 1  . Years of education: Not on file  . Highest education level: Not on file  Occupational History  . Occupation: retired , Artist  . Financial resource strain: Not on file  . Food insecurity:      Worry: Not on file    Inability: Not on file  . Transportation needs:    Medical: Not on file    Non-medical: Not on file  Tobacco Use  . Smoking status: Never Smoker  . Smokeless tobacco: Never Used  Substance and Sexual Activity  . Alcohol use: No  . Drug use: No  . Sexual activity: Not on file  Lifestyle  . Physical activity:    Days per week: Not on file    Minutes per session: Not on file  . Stress: Not on file  Relationships  . Social connections:    Talks on phone: Not on file    Gets together: Not on file    Attends religious service: Not on file    Active member of club or organization: Not on file    Attends meetings of clubs or organizations: Not on file    Relationship status: Not on file  . Intimate partner violence:    Fear of current or ex partner: Not on file    Emotionally abused: Not on file    Physically abused: Not on file    Forced sexual activity: Not on file  Other Topics Concern  . Not on file  Social History Narrative  Divorced, lives with her sister     Has one daughter who lives here in town      Allergies as of 05/07/2018   No Known Allergies     Medication List        Accurate as of 05/07/18  3:32 PM. Always use your most recent med list.          amLODipine 10 MG tablet Commonly known as:  NORVASC Take 1 tablet (10 mg total) by mouth daily.   aspirin 81 MG tablet Take 81 mg by mouth daily.   latanoprost 0.005 % ophthalmic solution Commonly known as:  XALATAN Place 1 drop into both eyes at bedtime.   levothyroxine 75 MCG tablet Commonly known as:  SYNTHROID, LEVOTHROID Take 1 tablet (75 mcg total) by mouth daily before breakfast.   losartan 50 MG tablet Commonly known as:  COZAAR Take 1 tablet (50 mg total) by mouth daily.          Objective:   Physical Exam  Skin:      BP 126/78 (BP Location: Left Arm, Patient Position: Sitting, Cuff Size: Small)   Pulse 83   Temp (!) 97.3 F (36.3 C) (Oral)   Resp 14    Ht 5\' 5"  (1.651 m)   Wt 136 lb 8 oz (61.9 kg)   SpO2 98%   BMI 22.71 kg/m  General:   Well developed, NAD, see BMI.  HEENT:  Normocephalic . Face symmetric, atraumatic. Neck: No thyromegaly Lungs:  CTA B Normal respiratory effort, no intercostal retractions, no accessory muscle use. Heart: RRR,  no murmur.  no pretibial edema bilaterally  Abdomen:  Not distended, soft, non-tender. No rebound or rigidity.   MSK: Right foot normal to inspection and palpation Neurologic:  alert & oriented X3.  Speech normal, gait appropriate for age and unassisted Psych--  Cognition and judgment appear intact.  Cooperative with normal attention span and concentration.  Behavior appropriate. No anxious or depressed appearing.     Assessment & Plan:  Assessment HTN CKD, stage III, Single kidney (absent L one); sees nephrology prn, last visit 2014. (Creatinine 1.8 = clearance ~ 30) Hypothyroidism:  s/p ablation, Dr. Chalmers Cater, no f/u schedule  Glaucoma H/o Gout H/o abnormal MRI abdomen , last MRI 2009, saw GI ---> follow up clinically, redo MRI prn  PLAN: HTN: Seems controlled on amlodipine, losartan. CKD: Check a CMP and CBC.  Avoid NSAIDs. Hypothyroidism: Check a TSH, on Synthroid. Weight loss: Checking labs, ROS negative.  Follow-up in 3 months Skin lesion: No worrisome features, recommend observation. Heel pain: Recommend stretching, call if not better.  Avoid NSAIDs RTC 3 months

## 2018-05-07 NOTE — Patient Instructions (Signed)
GO TO THE LAB : Get the blood work     GO TO THE FRONT DESK Schedule your next appointment for a checkup in 3 months  Watch the skin irritation, if it gets darker or worse, let me know  Please stretch your right heel daily  Avoid ibuprofen, naproxen, Advil, Motrin or similar NSAIDs. If you have pain, okay to take Tylenol

## 2018-05-07 NOTE — Assessment & Plan Note (Signed)
HTN: Seems controlled on amlodipine, losartan. CKD: Check a CMP and CBC.  Avoid NSAIDs. Hypothyroidism: Check a TSH, on Synthroid. Weight loss: Checking labs, ROS negative.  Follow-up in 3 months Skin lesion: No worrisome features, recommend observation. Heel pain: Recommend stretching, call if not better.  Avoid NSAIDs RTC 3 months

## 2018-05-07 NOTE — Progress Notes (Signed)
Pre visit review using our clinic review tool, if applicable. No additional management support is needed unless otherwise documented below in the visit note. 

## 2018-07-29 DIAGNOSIS — H04123 Dry eye syndrome of bilateral lacrimal glands: Secondary | ICD-10-CM | POA: Diagnosis not present

## 2018-07-29 DIAGNOSIS — H269 Unspecified cataract: Secondary | ICD-10-CM | POA: Diagnosis not present

## 2018-07-29 DIAGNOSIS — H402223 Chronic angle-closure glaucoma, left eye, severe stage: Secondary | ICD-10-CM | POA: Diagnosis not present

## 2018-07-29 DIAGNOSIS — H402212 Chronic angle-closure glaucoma, right eye, moderate stage: Secondary | ICD-10-CM | POA: Diagnosis not present

## 2018-08-03 ENCOUNTER — Other Ambulatory Visit: Payer: Self-pay | Admitting: Internal Medicine

## 2018-08-03 DIAGNOSIS — E039 Hypothyroidism, unspecified: Secondary | ICD-10-CM

## 2018-08-09 ENCOUNTER — Ambulatory Visit (INDEPENDENT_AMBULATORY_CARE_PROVIDER_SITE_OTHER): Payer: Medicare HMO | Admitting: Internal Medicine

## 2018-08-09 ENCOUNTER — Encounter: Payer: Self-pay | Admitting: Internal Medicine

## 2018-08-09 VITALS — BP 124/76 | HR 83 | Temp 97.9°F | Resp 16 | Ht 65.0 in | Wt 138.0 lb

## 2018-08-09 DIAGNOSIS — E039 Hypothyroidism, unspecified: Secondary | ICD-10-CM

## 2018-08-09 DIAGNOSIS — R634 Abnormal weight loss: Secondary | ICD-10-CM

## 2018-08-09 DIAGNOSIS — I10 Essential (primary) hypertension: Secondary | ICD-10-CM

## 2018-08-09 NOTE — Progress Notes (Signed)
Subjective:    Patient ID: [name], female    DOB: 29-Oct-1935, 83 y.o.   MRN: [id number]  DOS:  08/09/2018 Type of visit - description : f/u Interval history: Follow-up from previous visit, her main concern was weight loss. Since then she is feeling well.  Wt Readings from Last 3 Encounters:  08/09/18 138 lb (62.6 kg)  05/07/18 136 lb 8 oz (61.9 kg)  01/19/18 141 lb 4 oz (64.1 kg)     Review of Systems  Denies nausea, vomiting.  No diarrhea or abdominal pain No anxiety or depression Past Medical History:  Diagnosis Date  . Abnormal MRI of abdomen    ABNORMAL CT and MRI of pancreas, last MRI 1-09: after d/w GI we rec. to f/u clinically and redo XRs if problems    . Anemia   . CRI (chronic renal insufficiency)    elevated creatinine 4-11: stage 3 CKD, sees nephrology, history of solitary   . Diverticulosis   . Glaucoma   . Gout    h/o   . Hypertension   . Hyperthyroidism    s/p ablation, Dr Debbora Presto    Past Surgical History:  Procedure Laterality Date  . CATARACT EXTRACTION W/ INTRAOCULAR LENS  IMPLANT, BILATERAL  2006  . COLONOSCOPY    . ORIF TIBIA PLATEAU  10/24/2012   Procedure: OPEN REDUCTION INTERNAL FIXATION (ORIF) TIBIAL PLATEAU;  Surgeon: Jessy Oto, MD;  Location: [facility];  Service: Orthopedics;  Laterality: Left;    Social History   Socioeconomic History  . Marital status: Legally Separated    Spouse name: Not on file  . Number of children: 1  . Years of education: Not on file  . Highest education level: Not on file  Occupational History  . Occupation: retired , Artist  . Financial resource strain: Not on file  . Food insecurity:    Worry: Not on file    Inability: Not on file  . Transportation needs:    Medical: Not on file    Non-medical: Not on file  Tobacco Use  . Smoking status: Never Smoker  . Smokeless tobacco: Never Used  Substance and Sexual Activity  . Alcohol use: No  . Drug use: No  . Sexual activity: Not  on file  Lifestyle  . Physical activity:    Days per week: Not on file    Minutes per session: Not on file  . Stress: Not on file  Relationships  . Social connections:    Talks on phone: Not on file    Gets together: Not on file    Attends religious service: Not on file    Active member of club or organization: Not on file    Attends meetings of clubs or organizations: Not on file    Relationship status: Not on file  . Intimate partner violence:    Fear of current or ex partner: Not on file    Emotionally abused: Not on file    Physically abused: Not on file    Forced sexual activity: Not on file  Other Topics Concern  . Not on file  Social History Narrative   Divorced, lives with her sister     Has one daughter who lives here in town      Allergies as of 08/09/2018   No Known Allergies     Medication List        Accurate as of 08/09/18 11:59 PM. Always use your most  recent med list.          amLODipine 10 MG tablet Commonly known as:  NORVASC Take 1 tablet (10 mg total) by mouth daily.   aspirin 81 MG tablet Take 81 mg by mouth daily.   latanoprost 0.005 % ophthalmic solution Commonly known as:  XALATAN Place 1 drop into both eyes at bedtime.   levothyroxine 75 MCG tablet Commonly known as:  SYNTHROID, LEVOTHROID TAKE 1 TABLET DAILY BEFORE BREAKFAST   losartan 50 MG tablet Commonly known as:  COZAAR Take 1 tablet (50 mg total) by mouth daily.          Objective:   Physical Exam BP 124/76 (BP Location: Left Arm, Patient Position: Sitting, Cuff Size: Small)   Pulse 83   Temp 97.9 F (36.6 C) (Oral)   Resp 16   Ht 5\' 5"  (1.651 m)   Wt 138 lb (62.6 kg)   SpO2 98%   BMI 22.96 kg/m  General:   Well developed, NAD, see BMI.  HEENT:  Normocephalic . Face symmetric, atraumatic Lungs:  CTA B Normal respiratory effort, no intercostal retractions, no accessory muscle use. Heart: RRR,  no murmur.  No pretibial edema bilaterally  Skin: Not pale. Not  jaundice Neurologic:  alert & oriented X3.  Speech normal, gait appropriate for age and unassisted Psych--  Cognition and judgment appear intact.  Cooperative with normal attention span and concentration.  Behavior appropriate. No anxious or depressed appearing.       Assessment & Plan:   Assessment HTN CKD, stage III, Single kidney (absent L one); sees nephrology prn, last visit 2014. (Creatinine 1.8 = clearance ~ 30) Hypothyroidism:  s/p ablation, Dr. Chalmers Cater, no f/u schedule  Glaucoma H/o Gout H/o abnormal MRI abdomen , last MRI 2009, saw GI ---> follow up clinically, redo MRI prn  PLAN: Weight loss: Since the last office visit, she has gained 2 pounds, she has not changed her lifestyle.  Labs were normal.  Recommend observation HTN: Continue amlodipine and losartan, last BMP satisfactory. Hypothyroidism: On Synthroid, last TSH very good. Immunizations discussed  RTC 4 to 5 months CPX

## 2018-08-09 NOTE — Patient Instructions (Signed)
   GO TO THE FRONT DESK Schedule your next appointment for a  Physical exam in 4-5 months   Get a flu shot by October , get the high dose

## 2018-08-09 NOTE — Progress Notes (Signed)
Pre visit review using our clinic review tool, if applicable. No additional management support is needed unless otherwise documented below in the visit note. 

## 2018-08-10 NOTE — Assessment & Plan Note (Signed)
Declined Shingrix Recommend high-dose flu shot in October

## 2018-08-10 NOTE — Assessment & Plan Note (Signed)
Weight loss: Since the last office visit, she has gained 2 pounds, she has not changed her lifestyle.  Labs were normal.  Recommend observation HTN: Continue amlodipine and losartan, last BMP satisfactory. Hypothyroidism: On Synthroid, last TSH very good. Immunizations discussed  RTC 4 to 5 months CPX

## 2018-09-16 NOTE — Progress Notes (Addendum)
Subjective:   [name] is a 83 y.o. female who presents for Medicare Annual (Subsequent) preventive examination.  Review of Systems: No ROS.  Medicare Wellness Visit. Additional risk factors are reflected in the social history. Cardiac Risk Factors include: advanced age (>11men, >42 women);hypertension Sleep patterns: no issues Home Safety/Smoke Alarms: Feels safe in home. Smoke alarms in place. Lives in 2 story home. Lives with sister. No issues with stairs.   Female:        Mammo-  Active order    Dexa scan- declines       Objective:     Vitals: BP 140/78 (BP Location: Left Arm, Patient Position: Sitting, Cuff Size: Normal)   Ht 5\' 5"  (1.651 m)   Wt 139 lb 3.2 oz (63.1 kg)   BMI 23.16 kg/m   Body mass index is 23.16 kg/m.  Advanced Directives 09/17/2018 09/16/2017 01/26/2017 10/24/2012 10/24/2012  Does Patient Have a Medical Advance Directive? No No No Patient does not have advance directive Patient does not have advance directive  Would patient like information on creating a medical advance directive? Yes (MAU/Ambulatory/Procedural Areas - Information given) Yes (MAU/Ambulatory/Procedural Areas - Information given);No - Patient declined No - Patient declined - -  Pre-existing out of facility DNR order (yellow form or pink MOST form) - - - No No    Tobacco Social History   Tobacco Use  Smoking Status Never Smoker  Smokeless Tobacco Never Used     Counseling given: Not Answered   Clinical Intake: Pain : No/denies pain    Past Medical History:  Diagnosis Date  . Abnormal MRI of abdomen    ABNORMAL CT and MRI of pancreas, last MRI 1-09: after d/w GI we rec. to f/u clinically and redo XRs if problems    . Anemia   . CRI (chronic renal insufficiency)    elevated creatinine 4-11: stage 3 CKD, sees nephrology, history of solitary   . Diverticulosis   . Glaucoma   . Gout    h/o   . Hypertension   . Hyperthyroidism    s/p ablation, Dr Debbora Presto   Past Surgical  History:  Procedure Laterality Date  . CATARACT EXTRACTION W/ INTRAOCULAR LENS  IMPLANT, BILATERAL  2006  . COLONOSCOPY    . ORIF TIBIA PLATEAU  10/24/2012   Procedure: OPEN REDUCTION INTERNAL FIXATION (ORIF) TIBIAL PLATEAU;  Surgeon: Jessy Oto, MD;  Location: [facility];  Service: Orthopedics;  Laterality: Left;   Family History  Problem Relation Age of Onset  . Heart attack Sister   . Colon cancer Sister        colon  . Cancer Sister   . Heart attack Brother   . Pancreatic cancer Brother   . Diabetes Mother   . Esophageal cancer Neg Hx   . Stomach cancer Neg Hx   . Rectal cancer Neg Hx   . Breast cancer Neg Hx    Social History   Socioeconomic History  . Marital status: Legally Separated    Spouse name: Not on file  . Number of children: 1  . Years of education: Not on file  . Highest education level: Not on file  Occupational History  . Occupation: retired , Artist  . Financial resource strain: Not on file  . Food insecurity:    Worry: Not on file    Inability: Not on file  . Transportation needs:    Medical: Not on file    Non-medical: Not  on file  Tobacco Use  . Smoking status: Never Smoker  . Smokeless tobacco: Never Used  Substance and Sexual Activity  . Alcohol use: No  . Drug use: No  . Sexual activity: Not on file  Lifestyle  . Physical activity:    Days per week: Not on file    Minutes per session: Not on file  . Stress: Not on file  Relationships  . Social connections:    Talks on phone: Not on file    Gets together: Not on file    Attends religious service: Not on file    Active member of club or organization: Not on file    Attends meetings of clubs or organizations: Not on file    Relationship status: Not on file  Other Topics Concern  . Not on file  Social History Narrative   Divorced, lives with her sister     Has one daughter who lives here in town    Outpatient Encounter Medications as of 09/17/2018  Medication Sig    . amLODipine (NORVASC) 10 MG tablet Take 1 tablet (10 mg total) by mouth daily.  Marland Kitchen aspirin 81 MG tablet Take 81 mg by mouth daily.    Marland Kitchen latanoprost (XALATAN) 0.005 % ophthalmic solution Place 1 drop into both eyes at bedtime.   Marland Kitchen levothyroxine (SYNTHROID, LEVOTHROID) 75 MCG tablet TAKE 1 TABLET DAILY BEFORE BREAKFAST  . losartan (COZAAR) 50 MG tablet Take 1 tablet (50 mg total) by mouth daily.   No facility-administered encounter medications on file as of 09/17/2018.     Activities of Daily Living In your present state of health, do you have any difficulty performing the following activities: 09/17/2018  Hearing? N  Vision? N  Difficulty concentrating or making decisions? N  Walking or climbing stairs? N  Dressing or bathing? N  Doing errands, shopping? N  Preparing Food and eating ? N  Using the Toilet? N  In the past six months, have you accidently leaked urine? N  Do you have problems with loss of bowel control? N  Managing your Medications? N  Managing your Finances? N  Housekeeping or managing your Housekeeping? N  Some recent data might be hidden    Patient Care Team: Colon Branch, MD as PCP - General Monna Fam, MD as Consulting Physician (Ophthalmology) Jacelyn Pi, MD as Consulting Physician (Endocrinology)    Assessment:   This is a routine wellness examination for [place]. Physical assessment deferred to PCP.  Exercise Activities and Dietary recommendations Current Exercise Habits: The patient does not participate in regular exercise at present, Exercise limited by: None identified   Diet (meal preparation, eat out, water intake, caffeinated beverages, dairy products, fruits and vegetables): 24 hour recall Breakfast: eggs and crackers Lunch: skips Dinner:  spaghetti and salad   Pt reports she doesn't like water. Drinks tea and coffee decaf  Goals    . Maintain current health (pt-stated)       Fall Risk Fall Risk  09/17/2018 09/16/2017 10/02/2016  04/01/2016 03/30/2015  Falls in the past year? No No No No No  Number falls in past yr: - - - - -  Injury with Fall? - - - - -     Depression Screen PHQ 2/9 Scores 09/17/2018 09/16/2017 10/02/2016 04/01/2016  PHQ - 2 Score 0 0 0 0     Cognitive Function Ad8 score reviewed for issues:  Issues making decisions:no  Less interest in hobbies / activities:no  Repeats questions, stories (family  complaining):no  Trouble using ordinary gadgets (microwave, computer, phone):no  Forgets the month or year: no  Mismanaging finances: no  Remembering appts:no  Daily problems with thinking and/or memory:no Ad8 score is=0    MMSE - Mini Mental State Exam 09/16/2017  Orientation to time 5  Orientation to Place 5  Registration 3  Attention/ Calculation 5  Recall 2  Language- name 2 objects 2  Language- repeat 1  Language- follow 3 step command 3  Language- read & follow direction 1  Write a sentence 1  Copy design 1  Total score 29        Immunization History  Administered Date(s) Administered  . Influenza, High Dose Seasonal PF 10/02/2016, 09/16/2017  . Pneumococcal Conjugate-13 03/30/2015  . Pneumococcal Polysaccharide-23 12/29/2013  . Td 03/26/2010  . Tdap 12/29/2013   Screening Tests Health Maintenance  Topic Date Due  . DEXA SCAN  03/30/2002  . INFLUENZA VACCINE  06/24/2018  . MAMMOGRAM  07/16/2018  . TETANUS/TDAP  12/30/2023  . PNA vac Low Risk Adult  Completed      Plan:    Please schedule your next medicare wellness visit with me in 1 yr.  Continue to eat heart healthy diet (full of fruits, vegetables, whole grains, lean protein, water--limit salt, fat, and sugar intake) and increase physical activity as tolerated.  Continue doing brain stimulating activities (puzzles, reading, adult coloring books, staying active) to keep memory sharp.   Bring a copy of your living will and/or healthcare power of attorney to your next office visit.  You received your flu  shot today.  I have personally reviewed and noted the following in the patient's chart:   . Medical and social history . Use of alcohol, tobacco or illicit drugs  . Current medications and supplements . Functional ability and status . Nutritional status . Physical activity . Advanced directives . List of other physicians . Hospitalizations, surgeries, and ER visits in previous 12 months . Vitals . Screenings to include cognitive, depression, and falls . Referrals and appointments  In addition, I have reviewed and discussed with patient certain preventive protocols, quality metrics, and best practice recommendations. A written personalized care plan for preventive services as well as general preventive health recommendations were provided to patient.     Naaman Plummer [place], [place]  09/17/2018   Kathlene November, MD

## 2018-09-17 ENCOUNTER — Ambulatory Visit (INDEPENDENT_AMBULATORY_CARE_PROVIDER_SITE_OTHER): Payer: Medicare HMO | Admitting: *Deleted

## 2018-09-17 ENCOUNTER — Encounter: Payer: Self-pay | Admitting: *Deleted

## 2018-09-17 VITALS — BP 140/78 | HR 69 | Ht 65.0 in | Wt 139.2 lb

## 2018-09-17 DIAGNOSIS — Z0000 Encounter for general adult medical examination without abnormal findings: Secondary | ICD-10-CM | POA: Diagnosis not present

## 2018-09-17 DIAGNOSIS — Z23 Encounter for immunization: Secondary | ICD-10-CM | POA: Diagnosis not present

## 2018-09-17 NOTE — Patient Instructions (Signed)
Please schedule your next medicare wellness visit with me in 1 yr.  Continue to eat heart healthy diet (full of fruits, vegetables, whole grains, lean protein, water--limit salt, fat, and sugar intake) and increase physical activity as tolerated.  Continue doing brain stimulating activities (puzzles, reading, adult coloring books, staying active) to keep memory sharp.   Bring a copy of your living will and/or healthcare power of attorney to your next office visit.  You received your flu shot today.   [name] , Thank you for taking time to come for your Medicare Wellness Visit. I appreciate your ongoing commitment to your health goals. Please review the following plan we discussed and let me know if I can assist you in the future.   These are the goals we discussed: Goals    . Maintain current health (pt-stated)       This is a list of the screening recommended for you and due dates:  Health Maintenance  Topic Date Due  . DEXA scan (bone density measurement)  03/30/2002  . Flu Shot  06/24/2018  . Mammogram  07/16/2018  . Tetanus Vaccine  12/30/2023  . Pneumonia vaccines  Completed    Health Maintenance for Postmenopausal Women Menopause is a normal process in which your reproductive ability comes to an end. This process happens gradually over a span of months to years, usually between the ages of 45 and 60. Menopause is complete when you have missed 12 consecutive menstrual periods. It is important to talk with your health care provider about some of the most common conditions that affect postmenopausal women, such as heart disease, cancer, and bone loss (osteoporosis). Adopting a healthy lifestyle and getting preventive care can help to promote your health and wellness. Those actions can also lower your chances of developing some of these common conditions. What should I know about menopause? During menopause, you may experience a number of symptoms, such as:  Moderate-to-severe  hot flashes.  Night sweats.  Decrease in sex drive.  Mood swings.  Headaches.  Tiredness.  Irritability.  Memory problems.  Insomnia.  Choosing to treat or not to treat menopausal changes is an individual decision that you make with your health care provider. What should I know about hormone replacement therapy and supplements? Hormone therapy products are effective for treating symptoms that are associated with menopause, such as hot flashes and night sweats. Hormone replacement carries certain risks, especially as you become older. If you are thinking about using estrogen or estrogen with progestin treatments, discuss the benefits and risks with your health care provider. What should I know about heart disease and stroke? Heart disease, heart attack, and stroke become more likely as you age. This may be due, in part, to the hormonal changes that your body experiences during menopause. These can affect how your body processes dietary fats, triglycerides, and cholesterol. Heart attack and stroke are both medical emergencies. There are many things that you can do to help prevent heart disease and stroke:  Have your blood pressure checked at least every 1-2 years. High blood pressure causes heart disease and increases the risk of stroke.  If you are 19-14 years old, ask your health care provider if you should take aspirin to prevent a heart attack or a stroke.  Do not use any tobacco products, including cigarettes, chewing tobacco, or electronic cigarettes. If you need help quitting, ask your health care provider.  It is important to eat a healthy diet and maintain a healthy weight. ?  Be sure to include plenty of vegetables, fruits, low-fat dairy products, and lean protein. ? Avoid eating foods that are high in solid fats, added sugars, or salt (sodium).  Get regular exercise. This is one of the most important things that you can do for your health. ? Try to exercise for at least  150 minutes each week. The type of exercise that you do should increase your heart rate and make you sweat. This is known as moderate-intensity exercise. ? Try to do strengthening exercises at least twice each week. Do these in addition to the moderate-intensity exercise.  Know your numbers.Ask your health care provider to check your cholesterol and your blood glucose. Continue to have your blood tested as directed by your health care provider.  What should I know about cancer screening? There are several types of cancer. Take the following steps to reduce your risk and to catch any cancer development as early as possible. Breast Cancer  Practice breast self-awareness. ? This means understanding how your breasts normally appear and feel. ? It also means doing regular breast self-exams. Let your health care provider know about any changes, no matter how small.  If you are 87 or older, have a clinician do a breast exam (clinical breast exam or CBE) every year. Depending on your age, family history, and medical history, it may be recommended that you also have a yearly breast X-ray (mammogram).  If you have a family history of breast cancer, talk with your health care provider about genetic screening.  If you are at high risk for breast cancer, talk with your health care provider about having an MRI and a mammogram every year.  Breast cancer (BRCA) gene test is recommended for women who have family members with BRCA-related cancers. Results of the assessment will determine the need for genetic counseling and BRCA1 and for BRCA2 testing. BRCA-related cancers include these types: ? Breast. This occurs in males or females. ? Ovarian. ? Tubal. This may also be called fallopian tube cancer. ? Cancer of the abdominal or pelvic lining (peritoneal cancer). ? Prostate. ? Pancreatic.  Cervical, Uterine, and Ovarian Cancer Your health care provider may recommend that you be screened regularly for  cancer of the pelvic organs. These include your ovaries, uterus, and vagina. This screening involves a pelvic exam, which includes checking for microscopic changes to the surface of your cervix (Pap test).  For women ages 21-65, health care providers may recommend a pelvic exam and a Pap test every three years. For women ages 16-65, they may recommend the Pap test and pelvic exam, combined with testing for human papilloma virus (HPV), every five years. Some types of HPV increase your risk of cervical cancer. Testing for HPV may also be done on women of any age who have unclear Pap test results.  Other health care providers may not recommend any screening for nonpregnant women who are considered low risk for pelvic cancer and have no symptoms. Ask your health care provider if a screening pelvic exam is right for you.  If you have had past treatment for cervical cancer or a condition that could lead to cancer, you need Pap tests and screening for cancer for at least 20 years after your treatment. If Pap tests have been discontinued for you, your risk factors (such as having a new sexual partner) need to be reassessed to determine if you should start having screenings again. Some women have medical problems that increase the chance of getting cervical cancer.  In these cases, your health care provider may recommend that you have screening and Pap tests more often.  If you have a family history of uterine cancer or ovarian cancer, talk with your health care provider about genetic screening.  If you have vaginal bleeding after reaching menopause, tell your health care provider.  There are currently no reliable tests available to screen for ovarian cancer.  Lung Cancer Lung cancer screening is recommended for adults 105-5 years old who are at high risk for lung cancer because of a history of smoking. A yearly low-dose CT scan of the lungs is recommended if you:  Currently smoke.  Have a history of at  least 30 pack-years of smoking and you currently smoke or have quit within the past 15 years. A pack-year is smoking an average of one pack of cigarettes per day for one year.  Yearly screening should:  Continue until it has been 15 years since you quit.  Stop if you develop a health problem that would prevent you from having lung cancer treatment.  Colorectal Cancer  This type of cancer can be detected and can often be prevented.  Routine colorectal cancer screening usually begins at age 55 and continues through age 84.  If you have risk factors for colon cancer, your health care provider may recommend that you be screened at an earlier age.  If you have a family history of colorectal cancer, talk with your health care provider about genetic screening.  Your health care provider may also recommend using home test kits to check for hidden blood in your stool.  A small camera at the end of a tube can be used to examine your colon directly (sigmoidoscopy or colonoscopy). This is done to check for the earliest forms of colorectal cancer.  Direct examination of the colon should be repeated every 5-10 years until age 12. However, if early forms of precancerous polyps or small growths are found or if you have a family history or genetic risk for colorectal cancer, you may need to be screened more often.  Skin Cancer  Check your skin from head to toe regularly.  Monitor any moles. Be sure to tell your health care provider: ? About any new moles or changes in moles, especially if there is a change in a mole's shape or color. ? If you have a mole that is larger than the size of a pencil eraser.  If any of your family members has a history of skin cancer, especially at a young age, talk with your health care provider about genetic screening.  Always use sunscreen. Apply sunscreen liberally and repeatedly throughout the day.  Whenever you are outside, protect yourself by wearing Lefever  sleeves, pants, a wide-brimmed hat, and sunglasses.  What should I know about osteoporosis? Osteoporosis is a condition in which bone destruction happens more quickly than new bone creation. After menopause, you may be at an increased risk for osteoporosis. To help prevent osteoporosis or the bone fractures that can happen because of osteoporosis, the following is recommended:  If you are 39-38 years old, get at least 1,000 mg of calcium and at least 600 mg of vitamin D per day.  If you are older than age 66 but younger than age 43, get at least 1,200 mg of calcium and at least 600 mg of vitamin D per day.  If you are older than age 31, get at least 1,200 mg of calcium and at least 800 mg of vitamin  D per day.  Smoking and excessive alcohol intake increase the risk of osteoporosis. Eat foods that are rich in calcium and vitamin D, and do weight-bearing exercises several times each week as directed by your health care provider. What should I know about how menopause affects my mental health? Depression may occur at any age, but it is more common as you become older. Common symptoms of depression include:  Low or sad mood.  Changes in sleep patterns.  Changes in appetite or eating patterns.  Feeling an overall lack of motivation or enjoyment of activities that you previously enjoyed.  Frequent crying spells.  Talk with your health care provider if you think that you are experiencing depression. What should I know about immunizations? It is important that you get and maintain your immunizations. These include:  Tetanus, diphtheria, and pertussis (Tdap) booster vaccine.  Influenza every year before the flu season begins.  Pneumonia vaccine.  Shingles vaccine.  Your health care provider may also recommend other immunizations. This information is not intended to replace advice given to you by your health care provider. Make sure you discuss any questions you have with your health care  provider. Document Released: 01/02/2006 Document Revised: 05/30/2016 Document Reviewed: 08/14/2015 Elsevier Interactive Patient Education  2018 [employer].

## 2018-10-06 ENCOUNTER — Other Ambulatory Visit: Payer: Self-pay | Admitting: Internal Medicine

## 2018-10-06 MED ORDER — LOSARTAN POTASSIUM 50 MG PO TABS: 50.0000 mg | ORAL_TABLET | Freq: Every day | ORAL | 1 refills | Status: DC

## 2018-10-06 NOTE — Telephone Encounter (Signed)
Copied from [place] [id number]. Topic: Quick Communication - Rx Refill/Question >> Oct 06, 2018 11:06 AM Rayann Heman wrote: Medication:losartan (COZAAR) 50 MG tablet [[id number]]   Has the patient contacted their pharmacy?no Preferred Pharmacy (with phone number or street name): [place], [place] [phone] (Phone) [phone] (Fax)  Agent: Please be advised that RX refills may take up to 3 business days. We ask that you follow-up with your pharmacy.

## 2018-11-18 ENCOUNTER — Other Ambulatory Visit: Payer: Self-pay | Admitting: Internal Medicine

## 2018-12-27 ENCOUNTER — Other Ambulatory Visit: Payer: Self-pay | Admitting: Internal Medicine

## 2018-12-27 DIAGNOSIS — E039 Hypothyroidism, unspecified: Secondary | ICD-10-CM

## 2018-12-28 ENCOUNTER — Other Ambulatory Visit: Payer: Self-pay

## 2018-12-28 MED ORDER — LOSARTAN POTASSIUM 50 MG PO TABS: 50.0000 mg | ORAL_TABLET | Freq: Every day | ORAL | 1 refills | Status: DC

## 2019-01-12 ENCOUNTER — Encounter: Payer: Medicare HMO | Admitting: Internal Medicine

## 2019-01-20 ENCOUNTER — Encounter: Payer: Self-pay | Admitting: Internal Medicine

## 2019-01-20 ENCOUNTER — Ambulatory Visit (INDEPENDENT_AMBULATORY_CARE_PROVIDER_SITE_OTHER): Payer: Medicare HMO | Admitting: Internal Medicine

## 2019-01-20 VITALS — BP 124/62 | HR 76 | Temp 97.6°F | Resp 16 | Ht 65.0 in | Wt 140.4 lb

## 2019-01-20 DIAGNOSIS — E039 Hypothyroidism, unspecified: Secondary | ICD-10-CM | POA: Diagnosis not present

## 2019-01-20 DIAGNOSIS — Z0000 Encounter for general adult medical examination without abnormal findings: Secondary | ICD-10-CM | POA: Diagnosis not present

## 2019-01-20 NOTE — Patient Instructions (Signed)
GO TO THE LAB : Get the blood work     GO TO THE FRONT DESK Schedule your next appointment for a check up in 6-8 months     Use Aveeno to moisturize your legs

## 2019-01-20 NOTE — Assessment & Plan Note (Addendum)
-  Td: 2015; Pneumonia shot: 2015; prevnar : 2016; shingrex : declined  ; had a flu shot  -CCS: + FH Colon Ca;  cscope  2008, no polyps;  cscope again 08/2012,Dr Henrene Pastor, (+)adenomatous polyps; due 2018 but is not interested, benefits discussed - cervical Ca screening: no further PAPs, see previous entries  -breast ca screening: last MMG 2018, no h/o abnormal MMGs, discussed options, elected to stop screenings - (+) FH CAD- on  aspirin 81 mg daily - DEXA?   Declined again today; not on supplements, rec  vitamin D but pt is reluctant to do. -diet and  exercise discussed  -Labs: BMP, CBC, FLP, TSH

## 2019-01-20 NOTE — Progress Notes (Signed)
Subjective:    Patient ID: [name], female    DOB: 10/13/1935, 84 y.o.   MRN: [id number]  DOS:  01/20/2019 Type of visit - description: CPX No major concerns   Wt Readings from Last 3 Encounters:  01/20/19 140 lb 6 oz (63.7 kg)  09/17/18 139 lb 3.2 oz (63.1 kg)  08/09/18 138 lb (62.6 kg)   Review of Systems Pretibial skin is hyper pigmented and somewhat dry.   Other than above, a 14 point review of systems is negative    Past Medical History:  Diagnosis Date  . Abnormal MRI of abdomen    ABNORMAL CT and MRI of pancreas, last MRI 1-09: after d/w GI we rec. to f/u clinically and redo XRs if problems    . Anemia   . CRI (chronic renal insufficiency)    elevated creatinine 4-11: stage 3 CKD, sees nephrology, history of solitary   . Diverticulosis   . Glaucoma   . Gout    h/o   . Hypertension   . Hyperthyroidism    s/p ablation, Dr Debbora Presto    Past Surgical History:  Procedure Laterality Date  . CATARACT EXTRACTION W/ INTRAOCULAR LENS  IMPLANT, BILATERAL  2006  . COLONOSCOPY    . ORIF TIBIA PLATEAU  10/24/2012   Procedure: OPEN REDUCTION INTERNAL FIXATION (ORIF) TIBIAL PLATEAU;  Surgeon: Jessy Oto, MD;  Location: [facility];  Service: Orthopedics;  Laterality: Left;    Social History   Socioeconomic History  . Marital status: Legally Separated    Spouse name: Not on file  . Number of children: 1  . Years of education: Not on file  . Highest education level: Not on file  Occupational History  . Occupation: retired , Artist  . Financial resource strain: Not on file  . Food insecurity:    Worry: Not on file    Inability: Not on file  . Transportation needs:    Medical: Not on file    Non-medical: Not on file  Tobacco Use  . Smoking status: Never Smoker  . Smokeless tobacco: Never Used  Substance and Sexual Activity  . Alcohol use: No  . Drug use: No  . Sexual activity: Not on file  Lifestyle  . Physical activity:    Days per week:  Not on file    Minutes per session: Not on file  . Stress: Not on file  Relationships  . Social connections:    Talks on phone: Not on file    Gets together: Not on file    Attends religious service: Not on file    Active member of club or organization: Not on file    Attends meetings of clubs or organizations: Not on file    Relationship status: Not on file  . Intimate partner violence:    Fear of current or ex partner: Not on file    Emotionally abused: Not on file    Physically abused: Not on file    Forced sexual activity: Not on file  Other Topics Concern  . Not on file  Social History Narrative   Divorced, lives with her sister     Independent, drives    Has one daughter who lives here in town     Family History  Problem Relation Age of Onset  . Heart attack Sister   . Colon cancer Sister 28  . Cancer Sister   . Heart attack Brother   . Pancreatic cancer  Brother   . Diabetes Mother   . Esophageal cancer Neg Hx   . Stomach cancer Neg Hx   . Rectal cancer Neg Hx   . Breast cancer Neg Hx      Allergies as of 01/20/2019   No Known Allergies     Medication List       Accurate as of January 20, 2019 11:59 PM. Always use your most recent med list.        amLODipine 10 MG tablet Commonly known as:  NORVASC TAKE 1 TABLET EVERY DAY   aspirin 81 MG tablet Take 81 mg by mouth daily.   latanoprost 0.005 % ophthalmic solution Commonly known as:  XALATAN Place 1 drop into both eyes at bedtime.   levothyroxine 75 MCG tablet Commonly known as:  SYNTHROID, LEVOTHROID Take 1 tablet (75 mcg total) by mouth daily before breakfast.   losartan 50 MG tablet Commonly known as:  COZAAR Take 1 tablet (50 mg total) by mouth daily.           Objective:   Physical Exam BP 124/62 (BP Location: Left Arm, Patient Position: Sitting, Cuff Size: Small)   Pulse 76   Temp 97.6 F (36.4 C) (Oral)   Resp 16   Ht 5\' 5"  (1.651 m)   Wt 140 lb 6 oz (63.7 kg)   SpO2 98%    BMI 23.36 kg/m  General: Well developed, NAD, BMI noted Neck: No  thyromegaly  HEENT:  Normocephalic . Face symmetric, atraumatic Lungs:  CTA B Normal respiratory effort, no intercostal retractions, no accessory muscle use. Heart: RRR,  no murmur.  No pretibial edema bilaterally.  Good pedal pulses. Abdomen:  Not distended, soft, non-tender. No rebound or rigidity.   Skin: Patchy hyperpigmentation of the pretibial and distal lower extremity skin.  Skin is dry in general Neurologic:  alert & oriented X3.  Speech normal, gait appropriate for age and unassisted Strength symmetric and appropriate for age.  Psych: Cognition and judgment appear intact.  Cooperative with normal attention span and concentration.  Behavior appropriate. No anxious or depressed appearing.     Assessment     Assessment HTN CKD, stage III, Single kidney (absent L one); sees nephrology prn, last visit 2014. (Creatinine 1.8 = clearance ~ 30) Hypothyroidism:  s/p ablation, Dr. Chalmers Cater, no f/u schedule  Glaucoma H/o Gout H/o abnormal MRI abdomen , last MRI 2009, saw GI ---> follow up clinically, redo MRI prn  PLAN: XUX:YBFXOVAN losartan, checking labs CKD: Checking labs Hypothyroidism: On Synthroid, checking a TSH Hyperpigmented skin: No obvious etiology, does not have edema, good pedal pulses.  Recommend to keep the skin moisturized and observation. RTC 6 to 8 months for routine checkup

## 2019-01-20 NOTE — Progress Notes (Signed)
Pre visit review using our clinic review tool, if applicable. No additional management support is needed unless otherwise documented below in the visit note. 

## 2019-01-21 LAB — BASIC METABOLIC PANEL
BUN: 29 mg/dL — ABNORMAL HIGH (ref 6–23)
CALCIUM: 9.3 mg/dL (ref 8.4–10.5)
CO2: 23 meq/L (ref 19–32)
Chloride: 107 mEq/L (ref 96–112)
Creatinine, Ser: 1.86 mg/dL — ABNORMAL HIGH (ref 0.40–1.20)
GFR: 31.4 mL/min — ABNORMAL LOW (ref >60.00)
Glucose, Bld: 72 mg/dL (ref 70–99)
Potassium: 4.9 mEq/L (ref 3.5–5.1)
SODIUM: 139 meq/L (ref 135–145)

## 2019-01-21 LAB — LIPID PANEL
CHOL/HDL RATIO: 2
Cholesterol: 118 mg/dL (ref 0–200)
HDL: 61.8 mg/dL (ref >39.00)
LDL Cholesterol: 44 mg/dL (ref 0–99)
NONHDL: 56.3
Triglycerides: 60 mg/dL (ref 0.0–149.0)
VLDL: 12 mg/dL (ref 0.0–40.0)

## 2019-01-21 LAB — CBC WITH DIFFERENTIAL/PLATELET
BASOS ABS: 0.1 10*3/uL (ref 0.0–0.1)
Basophils Relative: 1.3 % (ref 0.0–3.0)
Eosinophils Absolute: 0.1 10*3/uL (ref 0.0–0.7)
Eosinophils Relative: 1.6 % (ref 0.0–5.0)
HCT: 34.7 % — ABNORMAL LOW (ref 36.0–46.0)
HEMOGLOBIN: 11.2 g/dL — AB (ref 12.0–15.0)
LYMPHS PCT: 33.5 % (ref 12.0–46.0)
Lymphs Abs: 1.7 10*3/uL (ref 0.7–4.0)
MCHC: 32.3 g/dL (ref 30.0–36.0)
MCV: 92.3 fl (ref 78.0–100.0)
MONOS PCT: 8.4 % (ref 3.0–12.0)
Monocytes Absolute: 0.4 10*3/uL (ref 0.1–1.0)
Neutro Abs: 2.9 10*3/uL (ref 1.4–7.7)
Neutrophils Relative %: 55.2 % (ref 43.0–77.0)
Platelets: 334 10*3/uL (ref 150.0–400.0)
RBC: 3.76 Mil/uL — ABNORMAL LOW (ref 3.87–5.11)
RDW: 16.2 % — ABNORMAL HIGH (ref 11.5–15.5)
WBC: 5.2 10*3/uL (ref 4.0–10.5)

## 2019-01-21 LAB — TSH: TSH: 0.74 u[IU]/mL (ref 0.35–4.50)

## 2019-01-21 NOTE — Assessment & Plan Note (Signed)
DEY:CXKGYJEH losartan, checking labs CKD: Checking labs Hypothyroidism: On Synthroid, checking a TSH Hyperpigmented skin: No obvious etiology, does not have edema, good pedal pulses.  Recommend to keep the skin moisturized and observation. RTC 6 to 8 months for routine checkup

## 2019-03-02 ENCOUNTER — Ambulatory Visit (INDEPENDENT_AMBULATORY_CARE_PROVIDER_SITE_OTHER): Payer: Medicare HMO | Admitting: Internal Medicine

## 2019-03-02 ENCOUNTER — Other Ambulatory Visit: Payer: Self-pay

## 2019-03-02 VITALS — BP 179/74 | HR 79 | Temp 98.4°F | Resp 16 | Ht 65.0 in | Wt 138.0 lb

## 2019-03-02 DIAGNOSIS — B029 Zoster without complications: Secondary | ICD-10-CM | POA: Diagnosis not present

## 2019-03-02 DIAGNOSIS — I10 Essential (primary) hypertension: Secondary | ICD-10-CM

## 2019-03-02 MED ORDER — VALACYCLOVIR HCL 1 G PO TABS: 1000.0000 mg | ORAL_TABLET | Freq: Every day | ORAL | 0 refills | Status: DC

## 2019-03-02 NOTE — Progress Notes (Signed)
Subjective:    Patient ID: [name], female    DOB: 11/14/1935, 83 y.o.   MRN: [id number]  DOS:  03/02/2019 Type of visit - description: Acute visit.  We initially attempted a virtual visit but she was unable to use a video or email me a picture of the rash.  4 days ago developed a rash at the right side of the back. Slightly painful but not itchy. Rash is like several little "dots", erythematous. I asked if she had blisters and she is not sure No new exposures  BP Readings from Last 3 Encounters:  03/02/19 (!) 179/74  01/20/19 124/62  09/17/18 140/78      Review of Systems  No fever chills No cough, chest pain, difficulty breathing.  Past Medical History:  Diagnosis Date  . Abnormal MRI of abdomen    ABNORMAL CT and MRI of pancreas, last MRI 1-09: after d/w GI we rec. to f/u clinically and redo XRs if problems    . Anemia   . CRI (chronic renal insufficiency)    elevated creatinine 4-11: stage 3 CKD, sees nephrology, history of solitary   . Diverticulosis   . Glaucoma   . Gout    h/o   . Hypertension   . Hyperthyroidism    s/p ablation, Dr Debbora Presto    Past Surgical History:  Procedure Laterality Date  . CATARACT EXTRACTION W/ INTRAOCULAR LENS  IMPLANT, BILATERAL  2006  . COLONOSCOPY    . ORIF TIBIA PLATEAU  10/24/2012   Procedure: OPEN REDUCTION INTERNAL FIXATION (ORIF) TIBIAL PLATEAU;  Surgeon: Jessy Oto, MD;  Location: [facility];  Service: Orthopedics;  Laterality: Left;    Social History   Socioeconomic History  . Marital status: Legally Separated    Spouse name: Not on file  . Number of children: 1  . Years of education: Not on file  . Highest education level: Not on file  Occupational History  . Occupation: retired , Artist  . Financial resource strain: Not on file  . Food insecurity:    Worry: Not on file    Inability: Not on file  . Transportation needs:    Medical: Not on file    Non-medical: Not on file  Tobacco Use  .  Smoking status: Never Smoker  . Smokeless tobacco: Never Used  Substance and Sexual Activity  . Alcohol use: No  . Drug use: No  . Sexual activity: Not on file  Lifestyle  . Physical activity:    Days per week: Not on file    Minutes per session: Not on file  . Stress: Not on file  Relationships  . Social connections:    Talks on phone: Not on file    Gets together: Not on file    Attends religious service: Not on file    Active member of club or organization: Not on file    Attends meetings of clubs or organizations: Not on file    Relationship status: Not on file  . Intimate partner violence:    Fear of current or ex partner: Not on file    Emotionally abused: Not on file    Physically abused: Not on file    Forced sexual activity: Not on file  Other Topics Concern  . Not on file  Social History Narrative   Divorced, lives with her sister     Independent, drives    Has one daughter who lives here in town  Allergies as of 03/02/2019   No Known Allergies     Medication List       Accurate as of March 02, 2019  2:39 PM. Always use your most recent med list.        amLODipine 10 MG tablet Commonly known as:  NORVASC TAKE 1 TABLET EVERY DAY   aspirin 81 MG tablet Take 81 mg by mouth daily.   latanoprost 0.005 % ophthalmic solution Commonly known as:  XALATAN Place 1 drop into both eyes at bedtime.   levothyroxine 75 MCG tablet Commonly known as:  SYNTHROID, LEVOTHROID Take 1 tablet (75 mcg total) by mouth daily before breakfast.   losartan 50 MG tablet Commonly known as:  COZAAR Take 1 tablet (50 mg total) by mouth daily.   valACYclovir 1000 MG tablet Commonly known as:  Valtrex Take 1 tablet (1,000 mg total) by mouth daily.           Objective:   Physical Exam Skin:        BP (!) 179/74 (BP Location: Left Arm, Patient Position: Sitting, Cuff Size: Small)   Pulse 79   Temp 98.4 F (36.9 C) (Oral)   Resp 16   Ht 5\' 5"  (1.651 m)   Wt  138 lb (62.6 kg)   SpO2 100%   BMI 22.96 kg/m  General:   Well developed, NAD, BMI noted. HEENT:  Normocephalic . Face symmetric, atraumatic Neurologic:  alert & oriented X3.  Speech normal, gait appropriate for age and unassisted Psych--  Cognition and judgment appear intact.  Cooperative with normal attention span and concentration.  Behavior appropriate. No anxious or depressed appearing.       Assessment     Assessment HTN CKD, stage III, Single kidney (absent L one); sees nephrology prn, last visit 2014. (Creatinine 1.8 = clearance ~ 30) Hypothyroidism:  s/p ablation, Dr. Chalmers Cater, no f/u schedule  Glaucoma H/o Gout H/o abnormal MRI abdomen , last MRI 2009, saw GI ---> follow up clinically, redo MRI prn  PLAN: Shingles: Rash is most likely consistent with shingles, I explained the patient what this condition is, what to expect, the potential to develop postherpetic neuralgia. Plan: Rx Valtrex @ a renal dose.  Tylenol as needed, call if not better.  HTN:BP is slightly elevated today, likely because she is apprehensive.  Typically BPs are very good.

## 2019-03-02 NOTE — Assessment & Plan Note (Signed)
Shingles: Rash is most likely consistent with shingles, I explained the patient what this condition is, what to expect, the potential to develop postherpetic neuralgia. Plan: Rx Valtrex @ a renal dose.  Tylenol as needed, call if not better.  HTN:BP is slightly elevated today, likely because she is apprehensive.  Typically BPs are very good.

## 2019-03-02 NOTE — Patient Instructions (Signed)
Take Valtrex 1 tablet daily for 1 week as prescribed  Keep the area clean and dry  Do not need to put any ointment on it  If you have some pain, take Tylenol  If you have severe pain, the rash spreads or you are not feeling well please let us know   Shingles  Shingles, which is also known as herpes zoster, is an infection that causes a painful skin rash and fluid-filled blisters. It is caused by a virus. Shingles only develops in people who:  Have had chickenpox.  Have been given a medicine to protect against chickenpox (have been vaccinated). Shingles is rare in this group. What are the causes? Shingles is caused by varicella-zoster virus (VZV). This is the same virus that causes chickenpox. After a person is exposed to VZV, the virus stays in the body in an inactive (dormant) state. Shingles develops if the virus is reactivated. This can happen many years after the first (initial) exposure to VZV. It is not known what causes this virus to be reactivated. What increases the risk? People who have had chickenpox or received the chickenpox vaccine are at risk for shingles. Shingles infection is more common in people who:  Are older than age 21.  Have a weakened disease-fighting system (immune system), such as people with: ? HIV. ? AIDS. ? Cancer.  Are taking medicines that weaken the immune system, such as transplant medicines.  Are experiencing a lot of stress. What are the signs or symptoms? Early symptoms of this condition include itching, tingling, and pain in an area on your skin. Pain may be described as burning, stabbing, or throbbing. A few days or weeks after early symptoms start, a painful red rash appears. The rash is usually on one side of the body and has a band-like or belt-like pattern. The rash eventually turns into fluid-filled blisters that break open, change into scabs, and dry up in about 2-3 weeks. At any time during the infection, you may also develop:  A  fever.  Chills.  A headache.  An upset stomach. How is this diagnosed? This condition is diagnosed with a skin exam. Skin or fluid samples may be taken from the blisters before a diagnosis is made. These samples are examined under a microscope or sent to a lab for testing. How is this treated? The rash may last for several weeks. There is not a specific cure for this condition. Your health care provider will probably prescribe medicines to help you manage pain, recover more quickly, and avoid [place]-term problems. Medicines may include:  Antiviral drugs.  Anti-inflammatory drugs.  Pain medicines.  Anti-itching medicines (antihistamines). If the area involved is on your face, you may be referred to a specialist, such as an eye doctor (ophthalmologist) or an ear, nose, and throat (ENT) doctor (otolaryngologist) to help you avoid eye problems, chronic pain, or disability. Follow these instructions at home: Medicines  Take over-the-counter and prescription medicines only as told by your health care provider.  Apply an anti-itch cream or numbing cream to the affected area as told by your health care provider. Relieving itching and discomfort   Apply cold, wet cloths (cold compresses) to the area of the rash or blisters as told by your health care provider.  Cool baths can be soothing. Try adding baking soda or dry oatmeal to the water to reduce itching. Do not bathe in hot water. Blister and rash care  Keep your rash covered with a loose bandage (dressing). Wear loose-fitting  clothing to help ease the pain of material rubbing against the rash.  Keep your rash and blisters clean by washing the area with mild soap and cool water as told by your health care provider.  Check your rash every day for signs of infection. Check for: ? More redness, swelling, or pain. ? Fluid or blood. ? Warmth. ? Pus or a bad smell.  Do not scratch your rash or pick at your blisters. To help avoid  scratching: ? Keep your fingernails clean and cut short. ? Wear gloves or mittens while you sleep, if scratching is a problem. General instructions  Rest as told by your health care provider.  Keep all follow-up visits as told by your health care provider. This is important.  Wash your hands often with soap and water. If soap and water are not available, use hand sanitizer. Doing this lowers your chance of getting a bacterial skin infection.  Before your blisters change into scabs, your shingles infection can cause chickenpox in people who have never had it or have never been vaccinated against it. To prevent this from happening, avoid contact with other people, especially: ? Babies. ? Pregnant women. ? Children who have eczema. ? Elderly people who have transplants. ? People who have chronic illnesses, such as cancer or AIDS. Contact a health care provider if:  Your pain is not relieved with prescribed medicines.  Your pain does not get better after the rash heals.  You have signs of infection in the rash area, such as: ? More redness, swelling, or pain around the rash. ? Fluid or blood coming from the rash. ? The rash area feeling warm to the touch. ? Pus or a bad smell coming from the rash. Get help right away if:  The rash is on your face or nose.  You have facial pain, pain around your eye area, or loss of feeling on one side of your face.  You have difficulty seeing.  You have ear pain or have ringing in your ear.  You have a loss of taste.  Your condition gets worse. Summary  Shingles, which is also known as herpes zoster, is an infection that causes a painful skin rash and fluid-filled blisters.  This condition is diagnosed with a skin exam. Skin or fluid samples may be taken from the blisters and examined before the diagnosis is made.  Keep your rash covered with a loose bandage (dressing). Wear loose-fitting clothing to help ease the pain of material rubbing  against the rash.  Before your blisters change into scabs, your shingles infection can cause chickenpox in people who have never had it or have never been vaccinated against it. This information is not intended to replace advice given to you by your health care provider. Make sure you discuss any questions you have with your health care provider. Document Released: 11/10/2005 Document Revised: 07/15/2017 Document Reviewed: 07/15/2017 Elsevier Interactive Patient Education  2019 [employer].

## 2019-03-22 ENCOUNTER — Ambulatory Visit: Payer: Self-pay | Admitting: Internal Medicine

## 2019-03-22 ENCOUNTER — Other Ambulatory Visit: Payer: Self-pay

## 2019-03-22 ENCOUNTER — Ambulatory Visit (INDEPENDENT_AMBULATORY_CARE_PROVIDER_SITE_OTHER): Payer: Medicare HMO | Admitting: Internal Medicine

## 2019-03-22 DIAGNOSIS — B028 Zoster with other complications: Secondary | ICD-10-CM | POA: Diagnosis not present

## 2019-03-22 NOTE — Telephone Encounter (Signed)
Pt. Reports she was seen early this month for shingles. Still has "broken out areas. One spot on my stomach and 2-3 patches on the right side of my back." States still has some pain and itching. Is taking Tylenol for pain. Wants to know does she need more Valtrex. Please advise pt. Uses [facility] on [address].  Answer Assessment - Initial Assessment Questions 1. APPEARANCE of RASH: "Describe the rash."      Pink, raised 2. LOCATION: "Where is the rash located?"      I spot on stomach and 2-3 on right back 3. ONSET: "When did the rash start?"      Started beginning of April 4. ITCHING: "Does the rash itch?" If so, ask: "How bad is the itch?"  (Scale 1-10; or mild, moderate, severe)     Itchy - 4 5. PAIN: "Does the rash hurt?" If so, ask: "How bad is the pain?"  (Scale 1-10; or mild, moderate, severe)     5 6. OTHER SYMPTOMS: "Do you have any other symptoms?" (e.g., fever)     No 7. PREGNANCY: "Is there any chance you are pregnant?" "When was your last menstrual period?"     No  Protocols used: [facility]

## 2019-03-22 NOTE — Progress Notes (Signed)
Subjective:    Patient ID: [name], female    DOB: [birth year], 84 y.o.   MRN: [id number]  DOS:  03/22/2019 Type of visit - description: Virtual Visit via Video Note  I connected with@ on 03/23/19 at  2:20 PM EDT by a video enabled telemedicine application and verified that I am speaking with the correct person using two identifiers.   THIS ENCOUNTER IS A VIRTUAL VISIT DUE TO COVID-19 - PATIENT WAS NOT SEEN IN THE OFFICE. PATIENT HAS CONSENTED TO VIRTUAL VISIT / TELEMEDICINE VISIT   Location of patient: home  Location of provider: office  I discussed the limitations of evaluation and management by telemedicine and the availability of in person appointments. The patient expressed understanding and agreed to proceed.  History of Present Illness Acute Recently diagnosed with shingles, symptoms started 02/26/2019, she was seen 4 days later, prescribed Valtrex. The reason she called today is because the rash is not going away and she is concerned. Additionally, after I saw her, she developed 1 additional spot at the right side of the abdomen. The original rash at the right back is somewhat painful at night when she lays down.   Review of Systems Denies any other symptoms, specifically no fever, chills or rashes in other areas  Past Medical History:  Diagnosis Date  . Abnormal MRI of abdomen    ABNORMAL CT and MRI of pancreas, last MRI 1-09: after d/w GI we rec. to f/u clinically and redo XRs if problems    . Anemia   . CRI (chronic renal insufficiency)    elevated creatinine 4-11: stage 3 CKD, sees nephrology, history of solitary   . Diverticulosis   . Glaucoma   . Gout    h/o   . Hypertension   . Hyperthyroidism    s/p ablation, Dr Debbora Presto    Past Surgical History:  Procedure Laterality Date  . CATARACT EXTRACTION W/ INTRAOCULAR LENS  IMPLANT, BILATERAL  2006  . COLONOSCOPY    . ORIF TIBIA PLATEAU  10/24/2012   Procedure: OPEN REDUCTION INTERNAL FIXATION (ORIF) TIBIAL  PLATEAU;  Surgeon: Jessy Oto, MD;  Location: [facility];  Service: Orthopedics;  Laterality: Left;    Social History   Socioeconomic History  . Marital status: Legally Separated    Spouse name: Not on file  . Number of children: 1  . Years of education: Not on file  . Highest education level: Not on file  Occupational History  . Occupation: retired , Artist  . Financial resource strain: Not on file  . Food insecurity:    Worry: Not on file    Inability: Not on file  . Transportation needs:    Medical: Not on file    Non-medical: Not on file  Tobacco Use  . Smoking status: Never Smoker  . Smokeless tobacco: Never Used  Substance and Sexual Activity  . Alcohol use: No  . Drug use: No  . Sexual activity: Not on file  Lifestyle  . Physical activity:    Days per week: Not on file    Minutes per session: Not on file  . Stress: Not on file  Relationships  . Social connections:    Talks on phone: Not on file    Gets together: Not on file    Attends religious service: Not on file    Active member of club or organization: Not on file    Attends meetings of clubs or organizations: Not on  file    Relationship status: Not on file  . Intimate partner violence:    Fear of current or ex partner: Not on file    Emotionally abused: Not on file    Physically abused: Not on file    Forced sexual activity: Not on file  Other Topics Concern  . Not on file  Social History Narrative   Divorced, lives with her sister     Independent, drives    Has one daughter who lives here in town      Allergies as of 03/22/2019   No Known Allergies     Medication List       Accurate as of March 22, 2019 11:59 PM. Always use your most recent med list.        amLODipine 10 MG tablet Commonly known as:  NORVASC TAKE 1 TABLET EVERY DAY   aspirin 81 MG tablet Take 81 mg by mouth daily.   latanoprost 0.005 % ophthalmic solution Commonly known as:  XALATAN Place 1 drop  into both eyes at bedtime.   levothyroxine 75 MCG tablet Commonly known as:  SYNTHROID Take 1 tablet (75 mcg total) by mouth daily before breakfast.   losartan 50 MG tablet Commonly known as:  COZAAR Take 1 tablet (50 mg total) by mouth daily.           Objective:   Physical Exam There were no vitals taken for this visit. This is a video conference, she is alert oriented x3, no apparent distress    Assessment     Assessment HTN CKD, stage III, Single kidney (absent L one); sees nephrology prn, last visit 2014. (Creatinine 1.8 = clearance ~ 30) Hypothyroidism:  s/p ablation, Dr. Chalmers Cater, no f/u schedule  Glaucoma H/o Gout H/o abnormal MRI abdomen , last MRI 2009, saw GI ---> follow up clinically, redo MRI prn  PLAN:  Shingles: Recently diagnosed with shingles, she is concerned because "the rash is not going away".  She does have some pain when she lays down on her back at night. Based on this information, I still think that she has shingles and some degree of neuropathy.  We talk about possibly adding a medication for pain control (gabapentin) but currently the pain is okay as Auguste she takes Tylenol at night. Also, explained that that the rash will disappear gradually over the next few weeks, she will call in 4 to 5 weeks if not improving. Again patient was reassured.  All questions answered.     I discussed the assessment and treatment plan with the patient. The patient was provided an opportunity to ask questions and all were answered. The patient agreed with the plan and demonstrated an understanding of the instructions.   The patient was advised to call back or seek an in-person evaluation if the symptoms worsen or if the condition fails to improve as anticipated.

## 2019-03-22 NOTE — Telephone Encounter (Signed)
Virtual Visit scheduled today.

## 2019-03-23 NOTE — Assessment & Plan Note (Signed)
Shingles: Recently diagnosed with shingles, she is concerned because "the rash is not going away".  She does have some pain when she lays down on her back at night. Based on this information, I still think that she has shingles and some degree of neuropathy.  We talk about possibly adding a medication for pain control (gabapentin) but currently the pain is okay as Hilley she takes Tylenol at night. Also, explained that that the rash will disappear gradually over the next few weeks, she will call in 4 to 5 weeks if not improving. Again patient was reassured.  All questions answered.

## 2019-06-25 ENCOUNTER — Other Ambulatory Visit: Payer: Self-pay | Admitting: Internal Medicine

## 2019-06-28 ENCOUNTER — Other Ambulatory Visit: Payer: Self-pay

## 2019-06-28 MED ORDER — AMLODIPINE BESYLATE 10 MG PO TABS: 10.0000 mg | ORAL_TABLET | Freq: Every day | ORAL | 1 refills | Status: DC

## 2019-07-25 ENCOUNTER — Other Ambulatory Visit: Payer: Self-pay | Admitting: Internal Medicine

## 2019-07-25 DIAGNOSIS — E039 Hypothyroidism, unspecified: Secondary | ICD-10-CM

## 2019-09-20 ENCOUNTER — Ambulatory Visit: Payer: Medicare HMO | Admitting: *Deleted

## 2019-09-22 NOTE — Progress Notes (Signed)
Subjective:   [name] is a 84 y.o. female who presents for Medicare Annual (Subsequent) preventive examination.  Review of Systems:    Home Safety/Smoke Alarms: Feels safe in home. Smoke alarms in place.  Lives alone in 2 story home. Nephew is moving in with her to help. No difficulty with stairs.   Female:   Mammo-  declines     Dexa scan- declines  Objective:     Vitals: BP (!) 161/59 (BP Location: Left Arm, Patient Position: Sitting, Cuff Size: Normal) Comment: all vitals done by K.Canter CMA in PCP visit. PCP aware.  Pulse 73   Ht 5\' 5"  (1.651 m)   Wt 133 lb 12.8 oz (60.7 kg)   SpO2 100%   BMI 22.27 kg/m   Body mass index is 22.27 kg/m.  Advanced Directives 09/26/2019 09/17/2018 09/16/2017 01/26/2017 10/24/2012 10/24/2012  Does Patient Have a Medical Advance Directive? No No No No Patient does not have advance directive Patient does not have advance directive  Would patient like information on creating a medical advance directive? No - Patient declined Yes (MAU/Ambulatory/Procedural Areas - Information given) Yes (MAU/Ambulatory/Procedural Areas - Information given);No - Patient declined No - Patient declined - -  Pre-existing out of facility DNR order (yellow form or pink MOST form) - - - - No No    Tobacco Social History   Tobacco Use  Smoking Status Never Smoker  Smokeless Tobacco Never Used     Counseling given: Not Answered   Clinical Intake:     Pain : No/denies pain                 Past Medical History:  Diagnosis Date  . Abnormal MRI of abdomen    ABNORMAL CT and MRI of pancreas, last MRI 1-09: after d/w GI we rec. to f/u clinically and redo XRs if problems    . Anemia   . CRI (chronic renal insufficiency)    elevated creatinine 4-11: stage 3 CKD, sees nephrology, history of solitary   . Diverticulosis   . Glaucoma   . Gout    h/o   . Hypertension   . Hyperthyroidism    s/p ablation, Dr Debbora Presto   Past Surgical History:  Procedure  Laterality Date  . CATARACT EXTRACTION W/ INTRAOCULAR LENS  IMPLANT, BILATERAL  2006  . COLONOSCOPY    . ORIF TIBIA PLATEAU  10/24/2012   Procedure: OPEN REDUCTION INTERNAL FIXATION (ORIF) TIBIAL PLATEAU;  Surgeon: Jessy Oto, MD;  Location: [facility];  Service: Orthopedics;  Laterality: Left;   Family History  Problem Relation Age of Onset  . Heart attack Sister   . Colon cancer Sister 33  . Cancer Sister   . Heart attack Brother   . Pancreatic cancer Brother   . Diabetes Mother   . Esophageal cancer Neg Hx   . Stomach cancer Neg Hx   . Rectal cancer Neg Hx   . Breast cancer Neg Hx    Social History   Socioeconomic History  . Marital status: Legally Separated    Spouse name: Not on file  . Number of children: 1  . Years of education: Not on file  . Highest education level: Not on file  Occupational History  . Occupation: retired , Artist  . Financial resource strain: Not on file  . Food insecurity    Worry: Not on file    Inability: Not on file  . Transportation needs  Medical: Not on file    Non-medical: Not on file  Tobacco Use  . Smoking status: Never Smoker  . Smokeless tobacco: Never Used  Substance and Sexual Activity  . Alcohol use: No  . Drug use: No  . Sexual activity: Not on file  Lifestyle  . Physical activity    Days per week: Not on file    Minutes per session: Not on file  . Stress: Not on file  Relationships  . Social Herbalist on phone: Not on file    Gets together: Not on file    Attends religious service: Not on file    Active member of club or organization: Not on file    Attends meetings of clubs or organizations: Not on file    Relationship status: Not on file  Other Topics Concern  . Not on file  Social History Narrative   Divorced, used to live with her sister, died ~ 09-27-19     Independent, drives    Has one daughter and another sister who lives here in town    Outpatient Encounter Medications as  of 09/26/2019  Medication Sig  . amLODipine (NORVASC) 10 MG tablet Take 1 tablet (10 mg total) by mouth daily.  Marland Kitchen aspirin 81 MG tablet Take 81 mg by mouth daily.    Marland Kitchen latanoprost (XALATAN) 0.005 % ophthalmic solution Place 1 drop into both eyes at bedtime.   Marland Kitchen levothyroxine (SYNTHROID) 75 MCG tablet Take 1 tablet (75 mcg total) by mouth daily before breakfast.  . losartan (COZAAR) 50 MG tablet Take 1 tablet (50 mg total) by mouth daily.   No facility-administered encounter medications on file as of 09/26/2019.     Activities of Daily Living In your present state of health, do you have any difficulty performing the following activities: 09/26/2019 09/26/2019  Hearing? N N  Vision? N N  Comment wears glasses. -  Difficulty concentrating or making decisions? N N  Walking or climbing stairs? N N  Dressing or bathing? N N  Doing errands, shopping? N N  Preparing Food and eating ? N -  Using the Toilet? N -  In the past six months, have you accidently leaked urine? N -  Do you have problems with loss of bowel control? N -  Managing your Medications? N -  Managing your Finances? N -  Housekeeping or managing your Housekeeping? N -  Some recent data might be hidden    Patient Care Team: Colon Branch, MD as PCP - General Monna Fam, MD as Consulting Physician (Ophthalmology) Jacelyn Pi, MD as Consulting Physician (Endocrinology)    Assessment:   This is a routine wellness examination for [place]. Physical assessment deferred to PCP.  Exercise Activities and Dietary recommendations Current Exercise Habits: The patient does not participate in regular exercise at present, Exercise limited by: None identified Diet (meal preparation, eat out, water intake, caffeinated beverages, dairy products, fruits and vegetables): well balanced   Goals    . Maintain current health (pt-stated)       Fall Risk Fall Risk  09/26/2019 09/26/2019 09/17/2018 09/16/2017 10/02/2016  Falls in the past  year? 0 0 No No No  Number falls in past yr: 0 - - - -  Injury with Fall? 0 - - - -  Follow up - Falls evaluation completed - - -    Depression Screen PHQ 2/9 Scores 09/26/2019 09/26/2019 01/20/2019 09/17/2018  PHQ - 2 Score 0 0 0 0  PHQ- 9 Score - - 0 -     Cognitive Function  MMSE - Mini Mental State Exam 09/16/2017  Orientation to time 5  Orientation to Place 5  Registration 3  Attention/ Calculation 5  Recall 2  Language- name 2 objects 2  Language- repeat 1  Language- follow 3 step command 3  Language- read & follow direction 1  Write a sentence 1  Copy design 1  Total score 29     6CIT Screen 09/26/2019  What Year? 0 points  What month? 0 points  Count back from 20 0 points  Months in reverse 0 points  Repeat phrase 0 points    Immunization History  Administered Date(s) Administered  . Fluad Quad(high Dose 65+) 09/26/2019  . Influenza, High Dose Seasonal PF 10/02/2016, 09/16/2017, 09/17/2018  . Pneumococcal Conjugate-13 03/30/2015  . Pneumococcal Polysaccharide-23 12/29/2013  . Td 03/26/2010  . Tdap 12/29/2013    Screening Tests Health Maintenance  Topic Date Due  . DEXA SCAN  03/30/2002  . TETANUS/TDAP  12/30/2023  . INFLUENZA VACCINE  Completed  . PNA vac Low Risk Adult  Completed       Plan:   See you next year!  Continue to eat heart healthy diet (full of fruits, vegetables, whole grains, lean protein, water--limit salt, fat, and sugar intake) and increase physical activity as tolerated.  Continue doing brain stimulating activities (puzzles, reading, adult coloring books, staying active) to keep memory sharp.   Bring a copy of your living will and/or healthcare power of attorney to your next office visit.   I have personally reviewed and noted the following in the patient's chart:   . Medical and social history . Use of alcohol, tobacco or illicit drugs  . Current medications and supplements . Functional ability and status . Nutritional  status . Physical activity . Advanced directives . List of other physicians . Hospitalizations, surgeries, and ER visits in previous 12 months . Vitals . Screenings to include cognitive, depression, and falls . Referrals and appointments  In addition, I have reviewed and discussed with patient certain preventive protocols, quality metrics, and best practice recommendations. A written personalized care plan for preventive services as well as general preventive health recommendations were provided to patient.     Shela Nevin, [place]  09/26/2019

## 2019-09-23 ENCOUNTER — Other Ambulatory Visit: Payer: Self-pay

## 2019-09-26 ENCOUNTER — Encounter: Payer: Self-pay | Admitting: Internal Medicine

## 2019-09-26 ENCOUNTER — Ambulatory Visit (INDEPENDENT_AMBULATORY_CARE_PROVIDER_SITE_OTHER): Payer: Medicare HMO | Admitting: Internal Medicine

## 2019-09-26 ENCOUNTER — Encounter: Payer: Self-pay | Admitting: *Deleted

## 2019-09-26 ENCOUNTER — Ambulatory Visit (INDEPENDENT_AMBULATORY_CARE_PROVIDER_SITE_OTHER): Payer: Medicare HMO | Admitting: *Deleted

## 2019-09-26 ENCOUNTER — Other Ambulatory Visit: Payer: Self-pay

## 2019-09-26 VITALS — BP 161/59 | HR 73 | Temp 96.6°F | Resp 16 | Ht 65.0 in | Wt 133.5 lb

## 2019-09-26 VITALS — BP 161/59 | HR 73 | Ht 65.0 in | Wt 133.8 lb

## 2019-09-26 DIAGNOSIS — H9313 Tinnitus, bilateral: Secondary | ICD-10-CM | POA: Diagnosis not present

## 2019-09-26 DIAGNOSIS — I10 Essential (primary) hypertension: Secondary | ICD-10-CM

## 2019-09-26 DIAGNOSIS — Z0000 Encounter for general adult medical examination without abnormal findings: Secondary | ICD-10-CM | POA: Diagnosis not present

## 2019-09-26 DIAGNOSIS — N1832 Chronic kidney disease, stage 3b: Secondary | ICD-10-CM

## 2019-09-26 DIAGNOSIS — Z23 Encounter for immunization: Secondary | ICD-10-CM

## 2019-09-26 LAB — BASIC METABOLIC PANEL
BUN: 23 mg/dL (ref 6–23)
CO2: 24 mEq/L (ref 19–32)
Calcium: 9.7 mg/dL (ref 8.4–10.5)
Chloride: 108 mEq/L (ref 96–112)
Creatinine, Ser: 1.7 mg/dL — ABNORMAL HIGH (ref 0.40–1.20)
GFR: 34.77 mL/min — ABNORMAL LOW (ref >60.00)
Glucose, Bld: 81 mg/dL (ref 70–99)
Potassium: 4.7 mEq/L (ref 3.5–5.1)
Sodium: 140 mEq/L (ref 135–145)

## 2019-09-26 NOTE — Progress Notes (Signed)
Pre visit review using our clinic review tool, if applicable. No additional management support is needed unless otherwise documented below in the visit note. 

## 2019-09-26 NOTE — Patient Instructions (Signed)
See you next year!  Continue to eat heart healthy diet (full of fruits, vegetables, whole grains, lean protein, water--limit salt, fat, and sugar intake) and increase physical activity as tolerated.  Continue doing brain stimulating activities (puzzles, reading, adult coloring books, staying active) to keep memory sharp.   Bring a copy of your living will and/or healthcare power of attorney to your next office visit.   [name] , Thank you for taking time to come for your Medicare Wellness Visit. I appreciate your ongoing commitment to your health goals. Please review the following plan we discussed and let me know if I can assist you in the future.   These are the goals we discussed: Goals    . Maintain current health (pt-stated)       This is a list of the screening recommended for you and due dates:  Health Maintenance  Topic Date Due  . DEXA scan (bone density measurement)  03/30/2002  . Tetanus Vaccine  12/30/2023  . Flu Shot  Completed  . Pneumonia vaccines  Completed    Preventive Care 47 Years and Older, Female Preventive care refers to lifestyle choices and visits with your health care provider that can promote health and wellness. This includes:  A yearly physical exam. This is also called an annual well check.  Regular dental and eye exams.  Immunizations.  Screening for certain conditions.  Healthy lifestyle choices, such as diet and exercise. What can I expect for my preventive care visit? Physical exam Your health care provider will check:  Height and weight. These may be used to calculate body mass index (BMI), which is a measurement that tells if you are at a healthy weight.  Heart rate and blood pressure.  Your skin for abnormal spots. Counseling Your health care provider may ask you questions about:  Alcohol, tobacco, and drug use.  Emotional well-being.  Home and relationship well-being.  Sexual activity.  Eating habits.  History of  falls.  Memory and ability to understand (cognition).  Work and work Statistician.  Pregnancy and menstrual history. What immunizations do I need?  Influenza (flu) vaccine  This is recommended every year. Tetanus, diphtheria, and pertussis (Tdap) vaccine  You may need a Td booster every 10 years. Varicella (chickenpox) vaccine  You may need this vaccine if you have not already been vaccinated. Zoster (shingles) vaccine  You may need this after age 4. Pneumococcal conjugate (PCV13) vaccine  One dose is recommended after age 97. Pneumococcal polysaccharide (PPSV23) vaccine  One dose is recommended after age 79. Measles, mumps, and rubella (MMR) vaccine  You may need at least one dose of MMR if you were born in 1957 or later. You may also need a second dose. Meningococcal conjugate (MenACWY) vaccine  You may need this if you have certain conditions. Hepatitis A vaccine  You may need this if you have certain conditions or if you travel or work in places where you may be exposed to hepatitis A. Hepatitis B vaccine  You may need this if you have certain conditions or if you travel or work in places where you may be exposed to hepatitis B. Haemophilus influenzae type b (Hib) vaccine  You may need this if you have certain conditions. You may receive vaccines as individual doses or as more than one vaccine together in one shot (combination vaccines). Talk with your health care provider about the risks and benefits of combination vaccines. What tests do I need? Blood tests  Lipid and  cholesterol levels. These may be checked every 5 years, or more frequently depending on your overall health.  Hepatitis C test.  Hepatitis B test. Screening  Lung cancer screening. You may have this screening every year starting at age 36 if you have a 30-pack-year history of smoking and currently smoke or have quit within the past 15 years.  Colorectal cancer screening. All adults should  have this screening starting at age 68 and continuing until age 100. Your health care provider may recommend screening at age 14 if you are at increased risk. You will have tests every 1-10 years, depending on your results and the type of screening test.  Diabetes screening. This is done by checking your blood sugar (glucose) after you have not eaten for a while (fasting). You may have this done every 1-3 years.  Mammogram. This may be done every 1-2 years. Talk with your health care provider about how often you should have regular mammograms.  BRCA-related cancer screening. This may be done if you have a family history of breast, ovarian, tubal, or peritoneal cancers. Other tests  Sexually transmitted disease (STD) testing.  Bone density scan. This is done to screen for osteoporosis. You may have this done starting at age 43. Follow these instructions at home: Eating and drinking  Eat a diet that includes fresh fruits and vegetables, whole grains, lean protein, and low-fat dairy products. Limit your intake of foods with high amounts of sugar, saturated fats, and salt.  Take vitamin and mineral supplements as recommended by your health care provider.  Do not drink alcohol if your health care provider tells you not to drink.  If you drink alcohol: ? Limit how much you have to 0-1 drink a day. ? Be aware of how much alcohol is in your drink. In the [place] one drink equals one 12 oz bottle of beer (355 mL), one 5 oz glass of wine (148 mL), or one 1 oz glass of hard liquor (44 mL). Lifestyle  Take daily care of your teeth and gums.  Stay active. Exercise for at least 30 minutes on 5 or more days each week.  Do not use any products that contain nicotine or tobacco, such as cigarettes, e-cigarettes, and chewing tobacco. If you need help quitting, ask your health care provider.  If you are sexually active, practice safe sex. Use a condom or other form of protection in order to prevent STIs  (sexually transmitted infections).  Talk with your health care provider about taking a low-dose aspirin or statin. What's next?  Go to your health care provider once a year for a well check visit.  Ask your health care provider how often you should have your eyes and teeth checked.  Stay up to date on all vaccines. This information is not intended to replace advice given to you by your health care provider. Make sure you discuss any questions you have with your health care provider. Document Released: 12/07/2015 Document Revised: 11/04/2018 Document Reviewed: 11/04/2018 Elsevier Patient Education  2020 [employer].

## 2019-09-26 NOTE — Progress Notes (Signed)
Subjective:    Patient ID: [name], female    DOB: 1935/11/18, 84 y.o.   MRN: [id number]  DOS:  09/26/2019 Type of visit - description: Routine checkup See last visit, shingles resolved with no sequela HTN: BP today slightly elevated, no ambulatory BPs.  Good compliance with medications Today she reports mild bilateral tinnitus.  No associated headache, visual disturbances.  Mild HOH bilaterally.  Review of Systems Denies lower extremity edema, abdominal pain.  Past Medical History:  Diagnosis Date  . Abnormal MRI of abdomen    ABNORMAL CT and MRI of pancreas, last MRI 1-09: after d/w GI we rec. to f/u clinically and redo XRs if problems    . Anemia   . CRI (chronic renal insufficiency)    elevated creatinine 4-11: stage 3 CKD, sees nephrology, history of solitary   . Diverticulosis   . Glaucoma   . Gout    h/o   . Hypertension   . Hyperthyroidism    s/p ablation, Dr Debbora Presto    Past Surgical History:  Procedure Laterality Date  . CATARACT EXTRACTION W/ INTRAOCULAR LENS  IMPLANT, BILATERAL  2006  . COLONOSCOPY    . ORIF TIBIA PLATEAU  10/24/2012   Procedure: OPEN REDUCTION INTERNAL FIXATION (ORIF) TIBIAL PLATEAU;  Surgeon: Jessy Oto, MD;  Location: [facility];  Service: Orthopedics;  Laterality: Left;    Social History   Socioeconomic History  . Marital status: Legally Separated    Spouse name: Not on file  . Number of children: 1  . Years of education: Not on file  . Highest education level: Not on file  Occupational History  . Occupation: retired , Artist  . Financial resource strain: Not on file  . Food insecurity    Worry: Not on file    Inability: Not on file  . Transportation needs    Medical: Not on file    Non-medical: Not on file  Tobacco Use  . Smoking status: Never Smoker  . Smokeless tobacco: Never Used  Substance and Sexual Activity  . Alcohol use: No  . Drug use: No  . Sexual activity: Not on file  Lifestyle  . Physical  activity    Days per week: Not on file    Minutes per session: Not on file  . Stress: Not on file  Relationships  . Social Herbalist on phone: Not on file    Gets together: Not on file    Attends religious service: Not on file    Active member of club or organization: Not on file    Attends meetings of clubs or organizations: Not on file    Relationship status: Not on file  . Intimate partner violence    Fear of current or ex partner: Not on file    Emotionally abused: Not on file    Physically abused: Not on file    Forced sexual activity: Not on file  Other Topics Concern  . Not on file  Social History Narrative   Divorced, used to live with her sister, died ~ 21-Oct-2019     Independent, drives    Has one daughter and another sister who lives here in town      Allergies as of 09/26/2019   No Known Allergies     Medication List       Accurate as of September 26, 2019 11:59 PM. If you have any questions, ask your nurse or doctor.  amLODipine 10 MG tablet Commonly known as: NORVASC Take 1 tablet (10 mg total) by mouth daily.   aspirin 81 MG tablet Take 81 mg by mouth daily.   latanoprost 0.005 % ophthalmic solution Commonly known as: XALATAN Place 1 drop into both eyes at bedtime.   levothyroxine 75 MCG tablet Commonly known as: SYNTHROID Take 1 tablet (75 mcg total) by mouth daily before breakfast.   losartan 50 MG tablet Commonly known as: COZAAR Take 1 tablet (50 mg total) by mouth daily.           Objective:   Physical Exam BP (!) 161/59 (BP Location: Left Arm, Patient Position: Sitting, Cuff Size: Small)   Pulse 73   Temp (!) 96.6 F (35.9 C) (Temporal)   Resp 16   Ht 5\' 5"  (1.651 m)   Wt 133 lb 8 oz (60.6 kg)   SpO2 100%   BMI 22.22 kg/m  General:   Well developed, NAD, BMI noted. HEENT:  Normocephalic . Face symmetric, atraumatic Neck: No thyromegaly Lungs:  CTA B Normal respiratory effort, no intercostal retractions,  no accessory muscle use. Heart: RRR,  no murmur.  No pretibial edema bilaterally  Skin: Not pale. Not jaundice Neurologic:  alert & oriented X3.  Speech normal, gait appropriate for age and unassisted Psych--  Cognition and judgment appear intact.  Cooperative with normal attention span and concentration.  Behavior appropriate. No anxious or depressed appearing.      Assessment    Assessment HTN CKD, stage III, Single kidney (absent L one); sees nephrology prn, last visit 2014. (Creatinine 1.8 = clearance ~ 30) Hypothyroidism:  s/p ablation, Dr. Chalmers Cater, no f/u schedule  Glaucoma H/o Gout H/o abnormal MRI abdomen , last MRI 2009, saw GI ---> follow up clinically, redo MRI prn  PLAN:  HTN: Slightly elevated today, is typically normal. Does not take NSAIDs. She lost her sister a couple of days ago, that may be playing a role on her elevated BP. For now we agreed to continue amlodipine, losartan.  Check ambulatory BPs at home. CKD: Checking a BMP. Tinnitus: Mild, bilateral, no red flags, recheck from time to time. Preventive care: Flu shot today RTC CPX 5 months

## 2019-09-26 NOTE — Patient Instructions (Addendum)
GO TO THE LAB : Get the blood work     GO TO THE FRONT DESK Schedule your next appointment  For a physical in 5 months    Check the  blood pressure 2  times a month  BP GOAL is between 110/65 and  135/85. If it is consistently higher or lower, let me know

## 2019-09-27 NOTE — Assessment & Plan Note (Signed)
HTN: Slightly elevated today, is typically normal. Does not take NSAIDs. She lost her sister a couple of days ago, that may be playing a role on her elevated BP. For now we agreed to continue amlodipine, losartan.  Check ambulatory BPs at home. CKD: Checking a BMP. Tinnitus: Mild, bilateral, no red flags, recheck from time to time. Preventive care: Flu shot today RTC CPX 5 months

## 2019-11-02 ENCOUNTER — Other Ambulatory Visit: Payer: Self-pay | Admitting: Internal Medicine

## 2019-11-19 ENCOUNTER — Other Ambulatory Visit: Payer: Self-pay | Admitting: Internal Medicine

## 2019-12-01 ENCOUNTER — Ambulatory Visit (INDEPENDENT_AMBULATORY_CARE_PROVIDER_SITE_OTHER): Payer: Medicare HMO | Admitting: Internal Medicine

## 2019-12-01 ENCOUNTER — Other Ambulatory Visit: Payer: Self-pay

## 2019-12-01 DIAGNOSIS — R5383 Other fatigue: Secondary | ICD-10-CM

## 2019-12-01 DIAGNOSIS — Z20822 Contact with and (suspected) exposure to covid-19: Secondary | ICD-10-CM

## 2019-12-01 NOTE — Progress Notes (Signed)
Subjective:    Patient ID: [name], female    DOB: 20-Aug-1935, 85 y.o.   MRN: [id number]  DOS:  12/01/2019 Type of visit - description: Virtual Visit via Telephone  Attempted  to make this a video visit, due to technical difficulties from the patient side it was not possible  thus we proceeded with a Virtual Visit via Telephone    I connected with above mentioned patient  by telephone and verified that I am speaking with the correct person using two identifiers.  THIS ENCOUNTER IS A VIRTUAL VISIT DUE TO COVID-19 - PATIENT WAS NOT SEEN IN THE OFFICE. PATIENT HAS CONSENTED TO VIRTUAL VISIT / TELEMEDICINE VISIT   Location of patient: home  Location of provider: office  I discussed the limitations, risks, security and privacy concerns of performing an evaluation and management service by telephone and the availability of in person appointments. I also discussed with the patient that there may be a patient responsible charge related to this service. The patient expressed understanding and agreed to proceed.  HPI: Acute I connected to the patient via phone, reportedly she has been very tired and has 2 family members that are + Covid.   Review of Systems   Past Medical History:  Diagnosis Date  . Abnormal MRI of abdomen    ABNORMAL CT and MRI of pancreas, last MRI 1-09: after d/w GI we rec. to f/u clinically and redo XRs if problems    . Anemia   . CRI (chronic renal insufficiency)    elevated creatinine 4-11: stage 3 CKD, sees nephrology, history of solitary   . Diverticulosis   . Glaucoma   . Gout    h/o   . Hypertension   . Hyperthyroidism    s/p ablation, [name]    Past Surgical History:  Procedure Laterality Date  . CATARACT EXTRACTION W/ INTRAOCULAR LENS  IMPLANT, BILATERAL  2006  . COLONOSCOPY    . ORIF TIBIA PLATEAU  10/24/2012   Procedure: OPEN REDUCTION INTERNAL FIXATION (ORIF) TIBIAL PLATEAU;  Surgeon: Judith Oto, MD;  Location: [facility];  Service:  Orthopedics;  Laterality: Left;    Social History   Socioeconomic History  . Marital status: Legally Separated    Spouse name: Not on file  . Number of children: 1  . Years of education: Not on file  . Highest education level: Not on file  Occupational History  . Occupation: retired , Scientist, water quality   Tobacco Use  . Smoking status: Never Smoker  . Smokeless tobacco: Never Used  Substance and Sexual Activity  . Alcohol use: No  . Drug use: No  . Sexual activity: Not on file  Other Topics Concern  . Not on file  Social History Narrative   Divorced, used to live with her sister, died ~ Sep 30, 2019     Independent, drives    Has one daughter and another sister who lives here in town   Social Determinants of Health   Financial Resource Strain:   . Difficulty of Paying Living Expenses: Not on file  Food Insecurity:   . Worried About Charity fundraiser in the Last Year: Not on file  . Ran Out of Food in the Last Year: Not on file  Transportation Needs:   . Lack of Transportation (Medical): Not on file  . Lack of Transportation (Non-Medical): Not on file  Physical Activity:   . Days of Exercise per Week: Not on file  . Minutes of Exercise  per Session: Not on file  Stress:   . Feeling of Stress : Not on file  Social Connections:   . Frequency of Communication with Friends and Family: Not on file  . Frequency of Social Gatherings with Friends and Family: Not on file  . Attends Religious Services: Not on file  . Active Member of Clubs or Organizations: Not on file  . Attends Archivist Meetings: Not on file  . Marital Status: Not on file  Intimate Partner Violence:   . Fear of Current or Ex-Partner: Not on file  . Emotionally Abused: Not on file  . Physically Abused: Not on file  . Sexually Abused: Not on file      Allergies as of 12/01/2019   No Known Allergies     Medication List       Accurate as of December 01, 2019  9:03 AM. If you have any questions, ask your  nurse or doctor.        amLODipine 10 MG tablet Commonly known as: NORVASC TAKE 1 TABLET EVERY DAY   aspirin 81 MG tablet Take 81 mg by mouth daily.   latanoprost 0.005 % ophthalmic solution Commonly known as: XALATAN Place 1 drop into both eyes at bedtime.   levothyroxine 75 MCG tablet Commonly known as: SYNTHROID Take 1 tablet (75 mcg total) by mouth daily before breakfast.   losartan 50 MG tablet Commonly known as: COZAAR Take 1 tablet (50 mg total) by mouth daily.           Objective:   Physical Exam There were no vitals taken for this visit. This is a telephone virtual visit.  She sounded sleepy, was not able to speak in complete sentences, did not answer many of my questions, I do not know if that is due to the telephone communication being  poor or if she was confused.  She did some coughing during the visit.    Assessment     Assessment HTN CKD, stage III, Single kidney (absent L one); sees nephrology prn, last visit 2014. (Creatinine 1.8 = clearance ~ 30) Hypothyroidism:  s/p ablation, Judith. Chalmers Harris, no f/u schedule  Glaucoma H/o Gout H/o abnormal MRI abdomen , last MRI 2009, saw GI ---> follow up clinically, redo MRI prn  PLAN:  Fatigue, suspect Covid: 85 year old female, assessed today via phone due to fatigue, two family members have been diagnosed with Covid including her nephew who  lives in the same house. Over the phone she sounded  sleepy, weak, somewhat short of breath.  I was unable to obtain further information: Chest pain, difficulty breathing, cough ? Further assessment over the phone is not possible, she needs to be seen ASAP. I offer her to call 911 and get an ambulance, she declined, stated her sister will take her to the emergency room. Subsequently I communicated with the patient's daughter, based on the information she will call 911. Addendum 4:50 PM: I spoke with the patient's daughter again, she called EMS, they went to [place] and  evaluate her, they recommend her to stay home, drink plenty of fluids and did not transfer her to the emergency room. The patient's daughter reports that [name] is  alert, complained to her of feeling tired, having some short of breath but denied cough. At this point we agreed to will check on [facility] frequently, encourage fluids, take Tylenol, hold BP meds for a day or 2 if the blood pressure is less than 110/70, call this office or  EMS again if things get worse     I discussed the assessment and treatment plan with the patient. The patient was provided an opportunity to ask questions and all were answered. The patient agreed with the plan and demonstrated an understanding of the instructions.   The patient was advised to call back or seek an in-person evaluation if the symptoms worsen or if the condition fails to improve as anticipated.  I provided 32 minutes of non-face-to-face time during this encounter.  Kathlene November, MD

## 2019-12-03 ENCOUNTER — Encounter (HOSPITAL_COMMUNITY): Payer: Self-pay | Admitting: Emergency Medicine

## 2019-12-03 ENCOUNTER — Other Ambulatory Visit: Payer: Self-pay

## 2019-12-03 ENCOUNTER — Other Ambulatory Visit: Payer: Self-pay | Admitting: Internal Medicine

## 2019-12-03 ENCOUNTER — Ambulatory Visit (INDEPENDENT_AMBULATORY_CARE_PROVIDER_SITE_OTHER)
Admission: EM | Admit: 2019-12-03 | Discharge: 2019-12-03 | Disposition: A | Discharge status: Home / Self Care | Payer: Medicare HMO | Source: Home / Self Care | Attending: Emergency Medicine | Admitting: Emergency Medicine

## 2019-12-03 ENCOUNTER — Emergency Department (HOSPITAL_COMMUNITY): Payer: Medicare HMO

## 2019-12-03 ENCOUNTER — Encounter (HOSPITAL_COMMUNITY): Payer: Self-pay

## 2019-12-03 ENCOUNTER — Inpatient Hospital Stay (HOSPITAL_COMMUNITY)
Admission: EM | Admit: 2019-12-03 | Discharge: 2019-12-06 | DRG: 177 | Disposition: A | Discharge status: Home Health | Payer: Medicare HMO | Attending: Internal Medicine | Admitting: Internal Medicine

## 2019-12-03 DIAGNOSIS — N1831 Chronic kidney disease, stage 3a: Secondary | ICD-10-CM | POA: Diagnosis present

## 2019-12-03 DIAGNOSIS — Z7982 Long term (current) use of aspirin: Secondary | ICD-10-CM

## 2019-12-03 DIAGNOSIS — R531 Weakness: Secondary | ICD-10-CM | POA: Diagnosis not present

## 2019-12-03 DIAGNOSIS — U071 COVID-19: Principal | ICD-10-CM | POA: Diagnosis present

## 2019-12-03 DIAGNOSIS — I129 Hypertensive chronic kidney disease with stage 1 through stage 4 chronic kidney disease, or unspecified chronic kidney disease: Secondary | ICD-10-CM | POA: Insufficient documentation

## 2019-12-03 DIAGNOSIS — N179 Acute kidney failure, unspecified: Secondary | ICD-10-CM | POA: Diagnosis present

## 2019-12-03 DIAGNOSIS — J1282 Pneumonia due to coronavirus disease 2019: Secondary | ICD-10-CM | POA: Diagnosis present

## 2019-12-03 DIAGNOSIS — Z79899 Other long term (current) drug therapy: Secondary | ICD-10-CM

## 2019-12-03 DIAGNOSIS — N183 Chronic kidney disease, stage 3 unspecified: Secondary | ICD-10-CM | POA: Insufficient documentation

## 2019-12-03 DIAGNOSIS — H409 Unspecified glaucoma: Secondary | ICD-10-CM | POA: Diagnosis present

## 2019-12-03 DIAGNOSIS — Z79890 Hormone replacement therapy (postmenopausal): Secondary | ICD-10-CM | POA: Diagnosis not present

## 2019-12-03 DIAGNOSIS — Z20822 Contact with and (suspected) exposure to covid-19: Secondary | ICD-10-CM | POA: Diagnosis not present

## 2019-12-03 DIAGNOSIS — J189 Pneumonia, unspecified organism: Secondary | ICD-10-CM | POA: Diagnosis not present

## 2019-12-03 DIAGNOSIS — J069 Acute upper respiratory infection, unspecified: Secondary | ICD-10-CM | POA: Diagnosis present

## 2019-12-03 DIAGNOSIS — E039 Hypothyroidism, unspecified: Secondary | ICD-10-CM | POA: Diagnosis present

## 2019-12-03 DIAGNOSIS — E860 Dehydration: Secondary | ICD-10-CM

## 2019-12-03 DIAGNOSIS — Z8249 Family history of ischemic heart disease and other diseases of the circulatory system: Secondary | ICD-10-CM | POA: Insufficient documentation

## 2019-12-03 DIAGNOSIS — I10 Essential (primary) hypertension: Secondary | ICD-10-CM | POA: Diagnosis present

## 2019-12-03 DIAGNOSIS — D649 Anemia, unspecified: Secondary | ICD-10-CM | POA: Insufficient documentation

## 2019-12-03 LAB — BASIC METABOLIC PANEL
Anion gap: 13 (ref 5–15)
BUN: 33 mg/dL — ABNORMAL HIGH (ref 8–23)
CO2: 17 mmol/L — ABNORMAL LOW (ref 22–32)
Calcium: 9.1 mg/dL (ref 8.9–10.3)
Chloride: 109 mmol/L (ref 98–111)
Creatinine, Ser: 2.43 mg/dL — ABNORMAL HIGH (ref 0.44–1.00)
GFR calc Af Amer: 21 mL/min — ABNORMAL LOW (ref >60)
GFR calc non Af Amer: 18 mL/min — ABNORMAL LOW (ref >60)
Glucose, Bld: 106 mg/dL — ABNORMAL HIGH (ref 70–99)
Potassium: 4.5 mmol/L (ref 3.5–5.1)
Sodium: 139 mmol/L (ref 135–145)

## 2019-12-03 LAB — POC SARS CORONAVIRUS 2 AG -  ED: SARS Coronavirus 2 Ag: NEGATIVE

## 2019-12-03 LAB — POC SARS CORONAVIRUS 2 AG: SARS Coronavirus 2 Ag: NEGATIVE

## 2019-12-03 LAB — CBC
HCT: 35.4 % — ABNORMAL LOW (ref 36.0–46.0)
Hemoglobin: 11.6 g/dL — ABNORMAL LOW (ref 12.0–15.0)
MCH: 29.1 pg (ref 26.0–34.0)
MCHC: 32.8 g/dL (ref 30.0–36.0)
MCV: 88.7 fL (ref 80.0–100.0)
Platelets: 310 10*3/uL (ref 150–400)
RBC: 3.99 MIL/uL (ref 3.87–5.11)
RDW: 15.7 % — ABNORMAL HIGH (ref 11.5–15.5)
WBC: 6.6 10*3/uL (ref 4.0–10.5)
nRBC: 0 % (ref 0.0–0.2)

## 2019-12-03 LAB — RESPIRATORY PANEL BY RT PCR (FLU A&B, COVID)
Influenza A by PCR: NEGATIVE
Influenza B by PCR: NEGATIVE
SARS Coronavirus 2 by RT PCR: NEGATIVE

## 2019-12-03 MED ORDER — SODIUM CHLORIDE 0.9 % IV SOLN
500.0000 mg | Freq: Once | INTRAVENOUS | Status: AC
  Administered 2019-12-04: 500 mg via INTRAVENOUS
  Filled 2019-12-03: qty 500

## 2019-12-03 MED ORDER — SODIUM CHLORIDE 0.9 % IV BOLUS
1000.0000 mL | Freq: Once | INTRAVENOUS | Status: AC
  Administered 2019-12-03: 1000 mL via INTRAVENOUS

## 2019-12-03 MED ORDER — SODIUM CHLORIDE 0.9% FLUSH
3.0000 mL | Freq: Once | INTRAVENOUS | Status: AC
  Administered 2019-12-04: 3 mL via INTRAVENOUS

## 2019-12-03 MED ORDER — SODIUM CHLORIDE 0.9 % IV SOLN
1.0000 g | Freq: Once | INTRAVENOUS | Status: AC
  Administered 2019-12-03: 1 g via INTRAVENOUS
  Filled 2019-12-03: qty 10

## 2019-12-03 NOTE — H&P (Signed)
History and Physical    [name] [id number] DOB: 1935-07-18 DOA: 12/03/2019  PCP: Colon Branch, MD Patient coming from: home   Chief Complaint: [name] Fatigue  HPI: [name] is a 85 y.o. female with medical history significant of CKD, Glaucoma, Gout, HTN, Hyperthyroidism s/p ablation, who presents with weakness and fatigue with reported fever to 104 F.  Patient reports she has been at home living with her nephew who was diagnosed with covid approximately 2 weeks ago and to whom she reports frequent exposure to.  She reports over the past five days she has become noticeably weaker, but denies cough, sob, sore throat, headache, diarrhea.  She did not report subjective fevers but states that EMS checked her temp today and she was noted to have a fever of 104 F.  She has not had any urinary symptoms, no rash.  She states she has lost her appetite and has not eaten for > 24 hours and has had reduced appetite ever since onset of symptoms 5 days ago.  She states she normally ambulates without any DME but right now feels to weak to get up and around.    Denies smoking, EtOH, drugs    Review of Systems: As per HPI otherwise 10 point review of systems negative.    Past Medical History:  Diagnosis Date  . Abnormal MRI of abdomen    ABNORMAL CT and MRI of pancreas, last MRI 1-09: after d/w GI we rec. to f/u clinically and redo XRs if problems    . Anemia   . CRI (chronic renal insufficiency)    elevated creatinine 4-11: stage 3 CKD, sees nephrology, history of solitary   . Diverticulosis   . Glaucoma   . Gout    h/o   . Hypertension   . Hyperthyroidism    s/p ablation, Dr Debbora Presto    Past Surgical History:  Procedure Laterality Date  . CATARACT EXTRACTION W/ INTRAOCULAR LENS  IMPLANT, BILATERAL  2006  . COLONOSCOPY    . ORIF TIBIA PLATEAU  10/24/2012   Procedure: OPEN REDUCTION INTERNAL FIXATION (ORIF) TIBIAL PLATEAU;  Surgeon: Jessy Oto, MD;  Location: [facility];   Service: Orthopedics;  Laterality: Left;     reports that she has never smoked. She has never used smokeless tobacco. She reports that she does not drink alcohol or use drugs.  No Known Allergies  Family History  Problem Relation Age of Onset  . Heart attack Sister   . Colon cancer Sister 65  . Cancer Sister   . Heart attack Brother   . Pancreatic cancer Brother   . Diabetes Mother   . Esophageal cancer Neg Hx   . Stomach cancer Neg Hx   . Rectal cancer Neg Hx   . Breast cancer Neg Hx      Prior to Admission medications   Medication Sig Start Date End Date Taking? Authorizing Provider  amLODipine (NORVASC) 10 MG tablet TAKE 1 TABLET EVERY DAY 11/21/19   Colon Branch, MD  aspirin 81 MG tablet Take 81 mg by mouth daily.      [provider]  latanoprost (XALATAN) 0.005 % ophthalmic solution Place 1 drop into both eyes at bedtime.     [provider]  levothyroxine (SYNTHROID) 75 MCG tablet Take 1 tablet (75 mcg total) by mouth daily before breakfast. 07/25/19   Colon Branch, MD  losartan (COZAAR) 50 MG tablet Take 1 tablet (50 mg total) by mouth daily. 11/03/19  Colon Branch, MD    Physical Exam: Vitals:   12/03/19 2115 12/03/19 2120 12/03/19 2130 12/03/19 2145  BP: (!) 135/58  (!) 128/48 (!) 142/68  Pulse: 90 68 66 (!) 122  Resp: (!) 28 (!) 21 (!) 30 (!) 26  Temp:      TempSrc:      SpO2: 97% 98% 98% 100%    Vitals:   12/03/19 2115 12/03/19 2120 12/03/19 2130 12/03/19 2145  BP: (!) 135/58  (!) 128/48 (!) 142/68  Pulse: 90 68 66 (!) 122  Resp: (!) 28 (!) 21 (!) 30 (!) 26  Temp:      TempSrc:      SpO2: 97% 98% 98% 100%     Constitutional: NAD, calm, comfortable, frail in appearance Eyes: EOMI, anicteric sclera ENMT: Mucous membranes are moist. Posterior pharynx clear of any exudate or lesions.Normal dentition.  Neck: normal, supple, no masses, no thyromegaly Respiratory: clear to auscultation bilaterally, no wheezing, no crackles. Normal  respiratory effort. No accessory muscle use.  Cardiovascular: Regular rate and rhythm, no murmurs / rubs / gallops. No extremity edema. 2+ pedal pulses. No carotid bruits.  Abdomen: no tenderness, no masses palpated. No hepatosplenomegaly. Bowel sounds positive.  Musculoskeletal: no clubbing / cyanosis. No joint deformity upper and lower extremities. Good ROM, no contractures. Normal muscle tone.  Skin: no rashes, lesions, ulcers. No induration Neurologic: CN 2-12 grossly intact. No appreciable focal deficits. Psychiatric: Normal judgment and insight. Alert and oriented x 3. Normal mood.     Labs on Admission: I have personally reviewed following labs and imaging studies  CBC: Recent Labs  Lab 12/03/19 1938  WBC 6.6  HGB 11.6*  HCT 35.4*  MCV 88.7  PLT 99991111   Basic Metabolic Panel: Recent Labs  Lab 12/03/19 1938  NA 139  K 4.5  CL 109  CO2 17*  GLUCOSE 106*  BUN 33*  CREATININE 2.43*  CALCIUM 9.1   GFR: CrCl cannot be calculated (Unknown ideal weight.). Liver Function Tests: No results for input(s): AST, ALT, ALKPHOS, BILITOT, PROT, ALBUMIN in the last 168 hours. No results for input(s): LIPASE, AMYLASE in the last 168 hours. No results for input(s): AMMONIA in the last 168 hours. Coagulation Profile: No results for input(s): INR, PROTIME in the last 168 hours. Cardiac Enzymes: No results for input(s): CKTOTAL, CKMB, CKMBINDEX, TROPONINI in the last 168 hours. BNP (last 3 results) No results for input(s): PROBNP in the last 8760 hours. HbA1C: No results for input(s): HGBA1C in the last 72 hours. CBG: No results for input(s): GLUCAP in the last 168 hours. Lipid Profile: No results for input(s): CHOL, HDL, LDLCALC, TRIG, CHOLHDL, LDLDIRECT in the last 72 hours. Thyroid Function Tests: No results for input(s): TSH, T4TOTAL, FREET4, T3FREE, THYROIDAB in the last 72 hours. Anemia Panel: No results for input(s): VITAMINB12, FOLATE, FERRITIN, TIBC, IRON, RETICCTPCT in  the last 72 hours. Urine analysis:    Component Value Date/Time   COLORURINE YELLOW 10/24/2012 [place] 10/24/2012 1835   LABSPEC 1.017 10/24/2012 1835   PHURINE 5.5 10/24/2012 1835   GLUCOSEU NEGATIVE 10/24/2012 1835   HGBUR NEGATIVE 10/24/2012 1835   HGBUR negative 05/31/2007 1355   BILIRUBINUR NEGATIVE 10/24/2012 1835   KETONESUR NEGATIVE 10/24/2012 1835   PROTEINUR NEGATIVE 10/24/2012 1835   UROBILINOGEN 0.2 10/24/2012 1835   NITRITE NEGATIVE 10/24/2012 1835   LEUKOCYTESUR NEGATIVE 10/24/2012 1835    Radiological Exams on Admission: DG Chest Port 1 View  Result Date: 12/03/2019 CLINICAL  DATA:  Weakness EXAM: PORTABLE CHEST 1 VIEW COMPARISON:  2013 FINDINGS: Rotated. Patchy density at the lung bases. No pleural effusion or pneumothorax. Cardiomediastinal contours are within normal limits for patient positioning. Lower thoracic scoliosis. IMPRESSION: Mild patchy atelectasis/consolidation or scarring at the lung bases. Electronically Signed   By: Macy Mis M.D.   On: 12/03/2019 21:47    EKG: Independently reviewed.   Assessment/Plan [name] is a 85 y.o. female with medical history significant of CKD, Glaucoma, Gout, HTN, Hyperthyroidism s/p ablation, who presents with weakness and fatigue with reported fever to 104 F.   # Pneumonia - reported fever of 104 F and has been tachypneic.  Initially Rx for CAP with Ceftriaxone and Azithromycin in ER.  I maintain a suspicion for COVID-19 due to close and frequent exposure. - will continue abx, but will obtain inflammatory markers, COVID PCR that is not rapid (given frequency of false negatives especially in this case with a known positive contact) - no steroids or remdesivir given patient is on room air - additionally obtain strep and legionella Ur Ag - UA pending   # AKI on CKD 3 - suspect pre-renal from poor intake/dehydration - will continue IVF, encourage PO intake - held losartan - monitor I/O  #  Hypothyroidism - 2/2 prior ablation - continue synthroid  # HTN - held meds, resume amlodipine if BP stable  # Glaucoma - continue eye gtts  DVT prophylaxis: Lovenox Code Status: Full Admission status: inpatient   Truddie Hidden MD [facility] Pager [phone]  If 7PM-7AM, please contact night-coverage [email or web address] Password [email or web address]  12/03/2019, 11:11 PM

## 2019-12-03 NOTE — ED Provider Notes (Signed)
[facility]    CSN: [id number] Arrival date & time: 12/03/19  1600      History   Chief Complaint Chief Complaint  Patient presents with  . Fever    HPI URAINA MANKIN is a 85 y.o. female.   Allisson Zimny Moss presents with complaints of loss of appetite, low energy, weakness. No cough, no runny nose. No fevers or chills. She told nursing, however, that she had a temp up to 104 today and took tylenol which helped, at 1400. Slight diarrhea. No nausea or vomiting. No headache or body aches. No sore throat. Hoarse speech. Normal urination. Her nephew tested positive for covid, she lives with him. He tested positive a few weeks ago. Others in the home have been healthy. No chest pain . No falls. Lives with family members. Symptoms started approximately 1/4. Worsening. Denies any previous similar. In reviewing her chart it appears that her Bp typically runs in the 99991111 systolic and today is in the low 100's. She is typically ambulatory but is in wheelchair due to weakness.   ROS per HPI, negative if not otherwise mentioned.      Past Medical History:  Diagnosis Date  . Abnormal MRI of abdomen    ABNORMAL CT and MRI of pancreas, last MRI 1-09: after d/w GI we rec. to f/u clinically and redo XRs if problems    . Anemia   . CRI (chronic renal insufficiency)    elevated creatinine 4-11: stage 3 CKD, sees nephrology, history of solitary   . Diverticulosis   . Glaucoma   . Gout    h/o   . Hypertension   . Hyperthyroidism    s/p ablation, Dr Debbora Presto    Patient Active Problem List   Diagnosis Date Noted  . PCP NOTES >>>>>>>>>>>>>>>>>>>>>> 04/01/2016  . Gout 02/06/2014  . Glaucoma 12/07/2012  . Annual physical exam 08/08/2011  . CKD (chronic kidney disease) stage 3, GFR 30-59 ml/min 09/23/2010  . Hypothyroidism 06/13/2008  . DIVERTICULOSIS, COLON 01/27/2008  . Anemia 08/03/2007  . Abnormal MRI of abdomen 08/03/2007  . Essential hypertension 05/31/2007    Past Surgical  History:  Procedure Laterality Date  . CATARACT EXTRACTION W/ INTRAOCULAR LENS  IMPLANT, BILATERAL  2006  . COLONOSCOPY    . ORIF TIBIA PLATEAU  10/24/2012   Procedure: OPEN REDUCTION INTERNAL FIXATION (ORIF) TIBIAL PLATEAU;  Surgeon: Jessy Oto, MD;  Location: [facility];  Service: Orthopedics;  Laterality: Left;    OB History   No obstetric history on file.      Home Medications    Prior to Admission medications   Medication Sig Start Date End Date Taking? Authorizing Provider  amLODipine (NORVASC) 10 MG tablet TAKE 1 TABLET EVERY DAY 11/21/19   Colon Branch, MD  aspirin 81 MG tablet Take 81 mg by mouth daily.      [provider]  latanoprost (XALATAN) 0.005 % ophthalmic solution Place 1 drop into both eyes at bedtime.     [provider]  levothyroxine (SYNTHROID) 75 MCG tablet Take 1 tablet (75 mcg total) by mouth daily before breakfast. 07/25/19   Colon Branch, MD  losartan (COZAAR) 50 MG tablet Take 1 tablet (50 mg total) by mouth daily. 11/03/19   Colon Branch, MD    Family History Family History  Problem Relation Age of Onset  . Heart attack Sister   . Colon cancer Sister 63  . Cancer Sister   . Heart attack Brother   .  Pancreatic cancer Brother   . Diabetes Mother   . Esophageal cancer Neg Hx   . Stomach cancer Neg Hx   . Rectal cancer Neg Hx   . Breast cancer Neg Hx     Social History Social History   Tobacco Use  . Smoking status: Never Smoker  . Smokeless tobacco: Never Used  Substance Use Topics  . Alcohol use: No  . Drug use: No     Allergies   Patient has no known allergies.   Review of Systems Review of Systems   Physical Exam Triage Vital Signs ED Triage Vitals  Enc Vitals Group     BP 12/03/19 1750 (!) 111/48     Pulse Rate 12/03/19 1750 78     Resp 12/03/19 1750 20     Temp 12/03/19 1750 98.1 F (36.7 C)     Temp Source 12/03/19 1750 Oral     SpO2 12/03/19 1750 98 %     Weight --      Height --      Head  Circumference --      Peak Flow --      Pain Score 12/03/19 1747 0     Pain Loc --      Pain Edu? --      Excl. in [place]? --    No data found.  Updated Vital Signs BP (!) 109/49 (BP Location: Right Arm)   Pulse 78   Temp 98.1 F (36.7 C) (Oral)   Resp 20   SpO2 98%    Physical Exam Constitutional:      Appearance: She is ill-appearing.     Comments: In wheelchair   HENT:     Mouth/Throat:     Mouth: Mucous membranes are dry.     Comments: Hoarse voice noted  Cardiovascular:     Rate and Rhythm: Normal rate.  Pulmonary:     Effort: Pulmonary effort is normal.  Musculoskeletal:     Comments: Patient with significant weakness noted, needed assist to get to toilet from wheelchair, typically is ambulatory   Skin:    General: Skin is warm and dry.  Neurological:     Mental Status: She is alert and oriented to person, place, and time.      UC Treatments / Results  Labs (all labs ordered are listed, but only abnormal results are displayed) Labs Reviewed  NOVEL CORONAVIRUS, NAA (HOSP ORDER, SEND-OUT TO REF LAB; TAT 18-24 HRS)  POC SARS CORONAVIRUS 2 AG -  ED  POC SARS CORONAVIRUS 2 AG    EKG   Radiology No results found.  Procedures Procedures (including critical care time)  Medications Ordered in UC Medications - No data to display  Initial Impression / Assessment and Plan / UC Course  I have reviewed the triage vital signs and the nursing notes.  Pertinent labs & imaging results that were available during my care of the patient were reviewed by me and considered in my medical decision making (see chart for details).     Patient with impressive fatigue, malaise, with potentially febrile illness, although no fever here. Soft BP. Unable to provide urine sample. Has had diarrhea. Dry mucus membranes. Hoarse voice. No significant URI symptoms and negative rapid covid. UTI considered vs false negative covid vs other viral illness. Patient appear to likely need  fluids and further lab testing and evaluation due to this significant change in statues. Per chart review, had an appointment by video with 1/7 with her  PCP who also recommend that she seek treatment.   Final Clinical Impressions(s) / UC Diagnoses   Final diagnoses:  Dehydration  Weakness     Discharge Instructions     Your blood pressure is low, and with the amount of weakness you are exhibiting I feel you likely need fluids and further evaluation and treatment. At this time I recommend that you go to the ER.     ED Prescriptions    None     PDMP not reviewed this encounter.   Zigmund Gottron, NP 12/03/19 1844

## 2019-12-03 NOTE — ED Triage Notes (Addendum)
C/o generalized weakness since Tuesday, loss of appetite, chills, fever, and loss of smell.  States she was tested at [facility] and [place] was negative.

## 2019-12-03 NOTE — ED Notes (Signed)
Unsuccessful attempt to start iv iv team consulted

## 2019-12-03 NOTE — Assessment & Plan Note (Signed)
Fatigue, suspect Covid: 85 year old female, assessed today via phone due to fatigue, two family members have been diagnosed with Covid including her nephew who  lives in the same house. Over the phone she sounded  sleepy, weak, somewhat short of breath.  I was unable to obtain further information: Chest pain, difficulty breathing, cough ? Further assessment over the phone is not possible, she needs to be seen ASAP. I offer her to call 911 and get an ambulance, she declined, stated her sister will take her to the emergency room. Subsequently I communicated with the patient's daughter, based on the information she will call 911. Addendum 4:50 PM: I spoke with the patient's daughter again, she called EMS, they went to [place] and evaluate her, they recommend her to stay home, drink plenty of fluids and did not transfer her to the emergency room. The patient's daughter reports that Kandis is  alert, complained to her of feeling tired, having some short of breath but denied cough. At this point we agreed to will check on [facility] frequently, encourage fluids, take Tylenol, hold BP meds for a day or 2 if the blood pressure is less than 110/70, call this office or EMS again if things get worse

## 2019-12-03 NOTE — ED Notes (Signed)
Pt c/o weakness and no appetite for one week  She lives at home with her nephew  No pain

## 2019-12-03 NOTE — ED Provider Notes (Signed)
[facility] EMERGENCY DEPARTMENT Provider Note   CSN: [id number] Arrival date & time: 12/03/19  1850     History Chief Complaint  Patient presents with  . Weakness    [name] is a 85 y.o. female.  HPI   Patient presents with concern of weakness, anorexia. Onset seems to have been a few days ago, without clear precipitant. Since onset she has felt progressively weaker, with less hunger and thirst, without vomiting, without diarrhea, without dyspnea, without pain.  To me she denies fever, but it seems as though she told her nursing staff at urgent care that she did have a fever within the past few days. It is unclear if she has taken any medication for relief or if there are any clear alleviating or exacerbating factors during this illness.  After initially presenting to urgent care she was sent here for evaluation.  Past Medical History:  Diagnosis Date  . Abnormal MRI of abdomen    ABNORMAL CT and MRI of pancreas, last MRI 1-09: after d/w GI we rec. to f/u clinically and redo XRs if problems    . Anemia   . CRI (chronic renal insufficiency)    elevated creatinine 4-11: stage 3 CKD, sees nephrology, history of solitary   . Diverticulosis   . Glaucoma   . Gout    h/o   . Hypertension   . Hyperthyroidism    s/p ablation, Dr Debbora Presto    Patient Active Problem List   Diagnosis Date Noted  . PCP NOTES >>>>>>>>>>>>>>>>>>>>>> 04/01/2016  . Gout 02/06/2014  . Glaucoma 12/07/2012  . Annual physical exam 08/08/2011  . CKD (chronic kidney disease) stage 3, GFR 30-59 ml/min 09/23/2010  . Hypothyroidism 06/13/2008  . DIVERTICULOSIS, COLON 01/27/2008  . Anemia 08/03/2007  . Abnormal MRI of abdomen 08/03/2007  . Essential hypertension 05/31/2007    Past Surgical History:  Procedure Laterality Date  . CATARACT EXTRACTION W/ INTRAOCULAR LENS  IMPLANT, BILATERAL  2006  . COLONOSCOPY    . ORIF TIBIA PLATEAU  10/24/2012   Procedure: OPEN REDUCTION INTERNAL  FIXATION (ORIF) TIBIAL PLATEAU;  Surgeon: Jessy Oto, MD;  Location: [facility];  Service: Orthopedics;  Laterality: Left;     OB History   No obstetric history on file.     Family History  Problem Relation Age of Onset  . Heart attack Sister   . Colon cancer Sister 109  . Cancer Sister   . Heart attack Brother   . Pancreatic cancer Brother   . Diabetes Mother   . Esophageal cancer Neg Hx   . Stomach cancer Neg Hx   . Rectal cancer Neg Hx   . Breast cancer Neg Hx     Social History   Tobacco Use  . Smoking status: Never Smoker  . Smokeless tobacco: Never Used  Substance Use Topics  . Alcohol use: No  . Drug use: No    Home Medications Prior to Admission medications   Medication Sig Start Date End Date Taking? Authorizing Provider  amLODipine (NORVASC) 10 MG tablet TAKE 1 TABLET EVERY DAY 11/21/19   Colon Branch, MD  aspirin 81 MG tablet Take 81 mg by mouth daily.      [provider]  latanoprost (XALATAN) 0.005 % ophthalmic solution Place 1 drop into both eyes at bedtime.     [provider]  levothyroxine (SYNTHROID) 75 MCG tablet Take 1 tablet (75 mcg total) by mouth daily before breakfast. 07/25/19  Colon Branch, MD  losartan (COZAAR) 50 MG tablet Take 1 tablet (50 mg total) by mouth daily. 11/03/19   Colon Branch, MD    Allergies    Patient has no known allergies.  Review of Systems   Review of Systems  Constitutional:       Per HPI, otherwise negative  HENT:       Per HPI, otherwise negative  Respiratory:       Per HPI, otherwise negative  Cardiovascular:       Per HPI, otherwise negative  Gastrointestinal: Positive for nausea. Negative for abdominal pain and vomiting.  Endocrine:       Negative aside from HPI  Genitourinary:       Neg aside from HPI   Musculoskeletal:       Per HPI, otherwise negative  Skin: Negative.   Neurological: Positive for weakness. Negative for syncope.    Physical Exam Updated Vital Signs BP (!) 142/68    Pulse (!) 122   Temp 98.1 F (36.7 C) (Oral)   Resp (!) 26   SpO2 100%   Physical Exam Vitals and nursing note reviewed.  Constitutional:      General: She is not in acute distress.    Appearance: She is well-developed.     Comments: Thin elderly female awake and alert speaking clearly  HENT:     Head: Normocephalic and atraumatic.  Eyes:     Conjunctiva/sclera: Conjunctivae normal.  Cardiovascular:     Rate and Rhythm: Regular rhythm. Tachycardia present.  Pulmonary:     Effort: Pulmonary effort is normal. No respiratory distress.     Breath sounds: Normal breath sounds. No stridor.  Abdominal:     General: There is no distension.     Tenderness: There is no abdominal tenderness. There is no guarding.  Skin:    General: Skin is warm and dry.  Neurological:     Mental Status: She is alert and oriented to person, place, and time.     Cranial Nerves: No cranial nerve deficit.     Motor: Atrophy present.     ED Results / Procedures / Treatments   Labs (all labs ordered are listed, but only abnormal results are displayed) Labs Reviewed  BASIC METABOLIC PANEL - Abnormal; Notable for the following components:      Result Value   CO2 17 (*)    Glucose, Bld 106 (*)    BUN 33 (*)    Creatinine, Ser 2.43 (*)    GFR calc non Af Amer 18 (*)    GFR calc Af Amer 21 (*)    All other components within normal limits  CBC - Abnormal; Notable for the following components:   Hemoglobin 11.6 (*)    HCT 35.4 (*)    RDW 15.7 (*)    All other components within normal limits  RESPIRATORY PANEL BY RT PCR (FLU A&B, COVID)  URINALYSIS, ROUTINE W REFLEX MICROSCOPIC    EKG EKG Interpretation  Date/Time:  Saturday December 03 2019 18:59:56 EST Ventricular Rate:  76 PR Interval:  124 QRS Duration: 74 QT Interval:  386 QTC Calculation: 434 R Axis:   44 Text Interpretation: Normal sinus rhythm with sinus arrhythmia unremarkable ECG Confirmed by Carmin Muskrat [phone]) on 12/03/2019  7:52:09 PM   Radiology DG Chest Port 1 View  Result Date: 12/03/2019 CLINICAL DATA:  Weakness EXAM: PORTABLE CHEST 1 VIEW COMPARISON:  2013 FINDINGS: Rotated. Patchy density at the lung bases. No pleural effusion  or pneumothorax. Cardiomediastinal contours are within normal limits for patient positioning. Lower thoracic scoliosis. IMPRESSION: Mild patchy atelectasis/consolidation or scarring at the lung bases. Electronically Signed   By: Macy Mis M.D.   On: 12/03/2019 21:47    Procedures Procedures (including critical care time)  Medications Ordered in ED Medications  sodium chloride flush (NS) 0.9 % injection 3 mL (has no administration in time range)  sodium chloride 0.9 % bolus 1,000 mL (has no administration in time range)  cefTRIAXone (ROCEPHIN) 1 g in sodium chloride 0.9 % 100 mL IVPB (has no administration in time range)  azithromycin (ZITHROMAX) 500 mg in sodium chloride 0.9 % 250 mL IVPB (has no administration in time range)    ED Course  I have reviewed the triage vital signs and the nursing notes.  Pertinent labs & imaging results that were available during my care of the patient were reviewed by me and considered in my medical decision making (see chart for details).    MDM Rules/Calculators/A&P                     Reviewed the urgent care note from earlier today, concerning for fever, weakness.  Plan of care Covid test at that point was negative.  Initial findings here notable for dehydration, with BUN 33, creatinine 2.43, fluids running.  10:54 PM Imaging concerning for pneumonia.  And with AKI, patient will receive fluids, require antibiotics.  Patient was tachypneic, tachycardic, and with aforementioned concerning findings, she will be admitted for monitoring. Covid test negative Final Clinical Impression(s) / ED Diagnoses Final diagnoses:  Community acquired pneumonia of left lower lobe of lung  Dehydration     Carmin Muskrat, MD 12/03/19 2256

## 2019-12-03 NOTE — ED Triage Notes (Signed)
Pt c/o general malaise; loss of appetite, chills, fever of 104 today; loss sense of smell. Symptoms started Monday 11/28/19. Denies sore throat, HA, SOB. Denies dysuria. Tylenol at 1400 today.

## 2019-12-03 NOTE — ED Notes (Signed)
Please call daughter Zaria Fosberg [place] @  [phone] for a status udate--Sandralee Tarkington

## 2019-12-03 NOTE — ED Notes (Signed)
Patient is being discharged from the Urgent [employer] and sent to the Emergency Department via wheelchair by staff. Per Augusto Gamble, patient is stable but in need of higher level of care due to weakness. Patient is aware and verbalizes understanding of plan of care.  Vitals:   12/03/19 1750 12/03/19 1756  BP: (!) 111/48 (!) 109/49  Pulse: 78   Resp: 20   Temp: 98.1 F (36.7 C)   SpO2: 98%

## 2019-12-03 NOTE — Discharge Instructions (Signed)
Your blood pressure is low, and with the amount of weakness you are exhibiting I feel you likely need fluids and further evaluation and treatment. At this time I recommend that you go to the ER.

## 2019-12-04 DIAGNOSIS — J189 Pneumonia, unspecified organism: Secondary | ICD-10-CM

## 2019-12-04 DIAGNOSIS — J069 Acute upper respiratory infection, unspecified: Secondary | ICD-10-CM | POA: Diagnosis present

## 2019-12-04 DIAGNOSIS — U071 COVID-19: Secondary | ICD-10-CM | POA: Diagnosis present

## 2019-12-04 DIAGNOSIS — N179 Acute kidney failure, unspecified: Secondary | ICD-10-CM

## 2019-12-04 DIAGNOSIS — Z8249 Family history of ischemic heart disease and other diseases of the circulatory system: Secondary | ICD-10-CM | POA: Diagnosis not present

## 2019-12-04 DIAGNOSIS — E039 Hypothyroidism, unspecified: Secondary | ICD-10-CM | POA: Diagnosis present

## 2019-12-04 DIAGNOSIS — R531 Weakness: Secondary | ICD-10-CM | POA: Diagnosis present

## 2019-12-04 DIAGNOSIS — J1282 Pneumonia due to coronavirus disease 2019: Secondary | ICD-10-CM | POA: Diagnosis present

## 2019-12-04 DIAGNOSIS — H409 Unspecified glaucoma: Secondary | ICD-10-CM | POA: Diagnosis present

## 2019-12-04 DIAGNOSIS — Z7982 Long term (current) use of aspirin: Secondary | ICD-10-CM | POA: Diagnosis not present

## 2019-12-04 DIAGNOSIS — N1831 Chronic kidney disease, stage 3a: Secondary | ICD-10-CM | POA: Diagnosis present

## 2019-12-04 DIAGNOSIS — E860 Dehydration: Secondary | ICD-10-CM | POA: Diagnosis present

## 2019-12-04 DIAGNOSIS — Z79890 Hormone replacement therapy (postmenopausal): Secondary | ICD-10-CM | POA: Diagnosis not present

## 2019-12-04 DIAGNOSIS — I129 Hypertensive chronic kidney disease with stage 1 through stage 4 chronic kidney disease, or unspecified chronic kidney disease: Secondary | ICD-10-CM | POA: Diagnosis present

## 2019-12-04 DIAGNOSIS — Z79899 Other long term (current) drug therapy: Secondary | ICD-10-CM | POA: Diagnosis not present

## 2019-12-04 LAB — NOVEL CORONAVIRUS, NAA (HOSP ORDER, SEND-OUT TO REF LAB; TAT 18-24 HRS): SARS-CoV-2, NAA: DETECTED — AB

## 2019-12-04 LAB — CREATININE, SERUM
Creatinine, Ser: 2.1 mg/dL — ABNORMAL HIGH (ref 0.44–1.00)
GFR calc Af Amer: 25 mL/min — ABNORMAL LOW (ref >60)
GFR calc non Af Amer: 21 mL/min — ABNORMAL LOW (ref >60)

## 2019-12-04 LAB — PROCALCITONIN: Procalcitonin: 0.19 ng/mL

## 2019-12-04 LAB — FIBRINOGEN: Fibrinogen: 718 mg/dL — ABNORMAL HIGH (ref 210–475)

## 2019-12-04 LAB — CBC
HCT: 31.7 % — ABNORMAL LOW (ref 36.0–46.0)
Hemoglobin: 10.2 g/dL — ABNORMAL LOW (ref 12.0–15.0)
MCH: 28.6 pg (ref 26.0–34.0)
MCHC: 32.2 g/dL (ref 30.0–36.0)
MCV: 88.8 fL (ref 80.0–100.0)
Platelets: 324 10*3/uL (ref 150–400)
RBC: 3.57 MIL/uL — ABNORMAL LOW (ref 3.87–5.11)
RDW: 15.7 % — ABNORMAL HIGH (ref 11.5–15.5)
WBC: 5.8 10*3/uL (ref 4.0–10.5)
nRBC: 0 % (ref 0.0–0.2)

## 2019-12-04 LAB — FERRITIN: Ferritin: 333 ng/mL — ABNORMAL HIGH (ref 11–307)

## 2019-12-04 LAB — D-DIMER, QUANTITATIVE: D-Dimer, Quant: >20 ug/mL-FEU — ABNORMAL HIGH (ref 0.00–0.50)

## 2019-12-04 LAB — ABO/RH: ABO/RH(D): O NEG

## 2019-12-04 LAB — LACTATE DEHYDROGENASE: LDH: 420 U/L — ABNORMAL HIGH (ref 98–192)

## 2019-12-04 MED ORDER — ENOXAPARIN SODIUM 40 MG/0.4ML ~~LOC~~ SOLN: 40.0000 mg | SUBCUTANEOUS | Status: DC

## 2019-12-04 MED ORDER — ALBUTEROL SULFATE HFA 108 (90 BASE) MCG/ACT IN AERS
2.0000 | INHALATION_SPRAY | Freq: Four times a day (QID) | RESPIRATORY_TRACT | Status: DC
  Administered 2019-12-04 – 2019-12-06 (×7): 2 via RESPIRATORY_TRACT
  Filled 2019-12-04 (×2): qty 6.7

## 2019-12-04 MED ORDER — ASPIRIN 81 MG PO CHEW
81.0000 mg | CHEWABLE_TABLET | Freq: Every day | ORAL | Status: DC
  Administered 2019-12-04 – 2019-12-06 (×3): 81 mg via ORAL
  Filled 2019-12-04 (×3): qty 1

## 2019-12-04 MED ORDER — ZINC SULFATE 220 (50 ZN) MG PO CAPS
220.0000 mg | ORAL_CAPSULE | Freq: Every day | ORAL | Status: DC
  Administered 2019-12-04 – 2019-12-06 (×3): 220 mg via ORAL
  Filled 2019-12-04 (×4): qty 1

## 2019-12-04 MED ORDER — ADULT MULTIVITAMIN W/MINERALS CH
1.0000 | ORAL_TABLET | Freq: Every day | ORAL | Status: DC
  Administered 2019-12-04 – 2019-12-06 (×3): 1 via ORAL
  Filled 2019-12-04 (×3): qty 1

## 2019-12-04 MED ORDER — SODIUM CHLORIDE 0.9 % IV SOLN: INTRAVENOUS | Status: DC

## 2019-12-04 MED ORDER — GUAIFENESIN ER 600 MG PO TB12
600.0000 mg | ORAL_TABLET | Freq: Two times a day (BID) | ORAL | Status: DC
  Administered 2019-12-04 – 2019-12-06 (×5): 600 mg via ORAL
  Filled 2019-12-04 (×5): qty 1

## 2019-12-04 MED ORDER — SODIUM CHLORIDE 0.9 % IV SOLN
500.0000 mg | INTRAVENOUS | Status: DC
  Administered 2019-12-05: 500 mg via INTRAVENOUS
  Filled 2019-12-04 (×2): qty 500

## 2019-12-04 MED ORDER — IPRATROPIUM-ALBUTEROL 20-100 MCG/ACT IN AERS
1.0000 | INHALATION_SPRAY | Freq: Four times a day (QID) | RESPIRATORY_TRACT | Status: DC
  Filled 2019-12-04: qty 4

## 2019-12-04 MED ORDER — LEVOTHYROXINE SODIUM 75 MCG PO TABS
75.0000 ug | ORAL_TABLET | Freq: Every day | ORAL | Status: DC
  Administered 2019-12-04 – 2019-12-06 (×3): 75 ug via ORAL
  Filled 2019-12-04 (×3): qty 1

## 2019-12-04 MED ORDER — GUAIFENESIN-DM 100-10 MG/5ML PO SYRP: 10.0000 mL | ORAL_SOLUTION | ORAL | Status: DC | PRN

## 2019-12-04 MED ORDER — IPRATROPIUM-ALBUTEROL 20-100 MCG/ACT IN AERS: 1.0000 | INHALATION_SPRAY | Freq: Four times a day (QID) | RESPIRATORY_TRACT | Status: DC | PRN

## 2019-12-04 MED ORDER — HYDROCOD POLST-CPM POLST ER 10-8 MG/5ML PO SUER: 5.0000 mL | Freq: Two times a day (BID) | ORAL | Status: DC | PRN

## 2019-12-04 MED ORDER — METHYLPREDNISOLONE SODIUM SUCC 40 MG IJ SOLR
40.0000 mg | Freq: Two times a day (BID) | INTRAMUSCULAR | Status: DC
  Administered 2019-12-04 – 2019-12-06 (×5): 40 mg via INTRAVENOUS
  Filled 2019-12-04 (×5): qty 1

## 2019-12-04 MED ORDER — HEPARIN SODIUM (PORCINE) 5000 UNIT/ML IJ SOLN
5000.0000 [IU] | Freq: Three times a day (TID) | INTRAMUSCULAR | Status: DC
  Administered 2019-12-04 – 2019-12-06 (×8): 5000 [IU] via SUBCUTANEOUS
  Filled 2019-12-04 (×8): qty 1

## 2019-12-04 MED ORDER — ASCORBIC ACID 500 MG PO TABS
500.0000 mg | ORAL_TABLET | Freq: Every day | ORAL | Status: DC
  Administered 2019-12-04 – 2019-12-06 (×3): 500 mg via ORAL
  Filled 2019-12-04 (×3): qty 1

## 2019-12-04 MED ORDER — LATANOPROST 0.005 % OP SOLN
1.0000 [drp] | Freq: Every day | OPHTHALMIC | Status: DC
  Administered 2019-12-04 – 2019-12-05 (×2): 1 [drp] via OPHTHALMIC
  Filled 2019-12-04: qty 2.5

## 2019-12-04 MED ORDER — SODIUM CHLORIDE 0.9 % IV SOLN
1.0000 g | INTRAVENOUS | Status: DC
  Administered 2019-12-04: 1 g via INTRAVENOUS
  Filled 2019-12-04: qty 10
  Filled 2019-12-04: qty 1
  Filled 2019-12-04: qty 10

## 2019-12-04 NOTE — Progress Notes (Signed)
Patient Demographics:    [name], is a 85 y.o. female, DOB - 01/19/1935, [id number]  Admit date - 12/03/2019   Admitting Physician Judith Hidden, MD  Outpatient Primary MD for the patient is Judith Branch, MD  LOS - 0   Chief Complaint  Patient presents with  . Weakness        Subjective:    [name] today has  No chest pain,  -  -Patient has had high fevers, fatigue, malaise, myalgias, nausea but no emesis -Appetite is poor  Assessment  & Plan :    Principal Problem:   Pneumonia due to COVID-19 virus Active Problems:   Acute respiratory disease due to COVID-19 virus   Hypothyroidism   Essential hypertension   CKD (chronic kidney disease) stage 3, GFR 30-59 ml/min   AKI (acute kidney injury) ([place])  Brief Summary:- 85 year old female with past medical history relevant for HTN CKD, stage III, Single kidney (absent L one); Hypothyroidism:  s/p ablation, Dr. Chalmers Harris, Glaucoma, H/o Gout admitted with fevers, fatigue and generalized weakness and found to have COVID-pneumonia (send out Covid test done by patient's PCP couple days prior to admission apparently came back positive)  A/p 1)Acute respiratory Infection secondary to COVID-19 infection/pneumonia--- The treatment plan and use of medications  for treatment of COVID-19 infection and possible side effects were discussed with patient and daughter ( Ms. [name]) , explained that there is No proven definitive treatment for COVID-19 infection, any medications used here are based on published clinical articles/anecdotal data which at times and not yet peer-reviewed or randomized control trials. Complete risks and [place]-term side effects are unknown, however in the best clinical judgment they seem to be of some clinical benefit . --potential side effects of Remdesivir including, but not limited to allergic reaction, nausea, vomiting, elevated  LFTs discussed with patient /family ,also discussed potential steroid side effect including elevated blood sugars, elevated blood pressure, psychosis/anxiety,  insomnia --discussed with patient and daughter ( Ms. [name]) verbalizes understanding and agrees to treatment protocols   --Patient is positive for COVID-19 infection, chest x-ray with findings of infiltrates/opacities- --Patient is NOT hypoxic  -Hold off on  Remdesivir at this time Start iv Solu-medrol 40 mg q 12  -Elevated Inflammatory markers noted including ferritin of 333, LDH 420, fibrinogen 718, D-dimer > 20 --Check and trend fibrinogen, CRP, pro calcitonin, CBC, BMP, d-dimer, LDH, ferritin and LFTs --Supplemental oxygen to keep O2 sats above 93% -Follow serial chest x-rays and ABGs as indicated --- Encourage prone positioning for More than 16 hours/day in increments of 2 to 3 hours at a time if able to tolerate --Attempt to maintain euvolemic state --Zinc and vitamin C as ordered -Albuterol inhaler as needed -send out Covid test done by patient's PCP couple days prior to admission apparently came back positive) -Patient's daughter and patient's nephew who lives with patient both are positive for Covid as well  2)Pneumonia-fever Covid pneumonia over bacterial pneumonia -Procalcitonin 0.19, CRP pending, WBC 6.6 -Currently on Rocephin/azithromycin -Continue mucolytics compression dilators and antipyretics  3)AKI----acute kidney injury on CKD stage -  III-worsening renal function due to Covid pneumonia with decreased oral intake/dehydration -    creatinine on admission= 2.43 ,   baseline creatinine =  1.7 to 1.8 (09/2019)    , creatinine is now=      , renally adjust medications, avoid nephrotoxic agents/dehydration/hypotension -Continue to hold losartan -Gentle hydration  4)Social/Ethics- discussed with patient and daughter ( Ms. [name])----patient is a full code with full scope of treatment  5)Hypothyroidism---  status post prior ablation, continue levothyroxine 75 mcg daily   6)HTN--stable okay to resume amlodipine at 2.5 mg daily, continue to hold losartan given AKI  Disposition/Need for in-Hospital Stay- patient unable to be discharged at this time due to AKI requiring IV hydration and COVID-pneumonia requiring steroids  Code Status : Full Code  Family Communication:   (patient is alert, awake and coherent) -discussed with patient and daughter ( Ms. [name])  Disposition Plan  : TBD  Consults  :  na  DVT Prophylaxis  : - Heparin - SCDs   Lab Results  Component Value Date   PLT 324 12/04/2019    Inpatient Medications  Scheduled Meds: . albuterol  2 puff Inhalation Q6H  . vitamin C  500 mg Oral Daily  . aspirin  81 mg Oral Daily  . guaiFENesin  600 mg Oral BID  . heparin injection (subcutaneous)  5,000 Units Subcutaneous Q8H  . latanoprost  1 drop Both Eyes QHS  . levothyroxine  75 mcg Oral QAC breakfast  . methylPREDNISolone (SOLU-MEDROL) injection  40 mg Intravenous Q12H  . multivitamin with minerals  1 tablet Oral Daily  . zinc sulfate  220 mg Oral Daily   Continuous Infusions: . sodium chloride 40 mL/hr at 12/04/19 1325  . [START ON 12/05/2019] azithromycin    . cefTRIAXone (ROCEPHIN)  IV     PRN Meds:.chlorpheniramine-HYDROcodone, guaiFENesin-dextromethorphan    Anti-infectives (From admission, onward)   Start     Dose/Rate Route Frequency Ordered Stop   12/05/19 0000  azithromycin (ZITHROMAX) 500 mg in sodium chloride 0.9 % 250 mL IVPB     500 mg 250 mL/hr over 60 Minutes Intravenous Every 24 hours 12/04/19 0146     12/04/19 2200  cefTRIAXone (ROCEPHIN) 1 g in sodium chloride 0.9 % 100 mL IVPB     1 g 200 mL/hr over 30 Minutes Intravenous Every 24 hours 12/04/19 0146     12/03/19 2330  azithromycin (ZITHROMAX) 500 mg in sodium chloride 0.9 % 250 mL IVPB     500 mg 250 mL/hr over 60 Minutes Intravenous  Once 12/03/19 2252 12/04/19 0107   12/03/19 2300   cefTRIAXone (ROCEPHIN) 1 g in sodium chloride 0.9 % 100 mL IVPB     1 g 200 mL/hr over 30 Minutes Intravenous  Once 12/03/19 2252 12/04/19 0000        Objective:   Vitals:   12/04/19 0500 12/04/19 0515 12/04/19 0545 12/04/19 0639  BP: (!) 122/52 (!) 122/50 135/63 (!) 131/51  Pulse: 60 63 70 70  Resp: (!) 29 (!) 30 20 16   Temp:    97.7 F (36.5 C)  TempSrc:    Oral  SpO2: 95% 96% 96% 97%  Weight:      Height:        Wt Readings from Last 3 Encounters:  12/04/19 60.6 kg  09/26/19 60.7 kg  09/26/19 60.6 kg    Intake/Output Summary (Last 24 hours) at 12/04/2019 1518 Last data filed at 12/04/2019 1500 Gross per 24 hour  Intake 2023.33 ml  Output -  Net 2023.33 ml   Physical Exam Gen:- Awake Alert, no conversational dyspnea HEENT:- [place].AT, No sclera icterus Neck-Supple Neck,No  JVD,.  Lungs-diminished in bases with scattered rhonchi, no wheezing  CV- S1, S2 normal, regular  Abd-  +ve B.Sounds, Abd Soft, No tenderness,    Extremity/Skin:- No  edema, pedal pulses present  Psych-affect is appropriate, oriented x3 Neuro-generalized weakness, no new focal deficits, no tremors   Data Review:   Micro Results Recent Results (from the past 240 hour(s))  Novel Coronavirus, NAA (Hosp order, Send-out to Ref Lab; TAT 18-24 hrs     Status: Abnormal   Collection Time: 12/03/19  6:53 PM   Specimen: Nasopharyngeal Swab; Respiratory  Result Value Ref Range Status   SARS-CoV-2, NAA DETECTED (A) NOT DETECTED Final    Comment: (NOTE)                  Client Requested Flag This nucleic acid amplification test was developed and its performance characteristics determined by [employer]. Nucleic acid amplification tests include PCR and TMA. This test has not been FDA cleared or approved. This test has been authorized by FDA under an Emergency Use Authorization (EUA). This test is only authorized for the duration of time the declaration that circumstances exist justifying the  authorization of the emergency use of in vitro diagnostic tests for detection of SARS-CoV-2 virus and/or diagnosis of COVID-19 infection under section 564(b)(1) of the Act, 21 U.S.C. [id number]) (1), unless the authorization is terminated or revoked sooner. When diagnostic testing is negative, the possibility of a false negative result should be considered in the context of a patient's recent exposures and the presence of clinical signs and symptoms consistent with COVID-19. An individual without symptoms of COVID-  19 and who is not shedding SARS-CoV-2 virus would expect to have a negative (not detected) result in this assay. Performed At: [facility] [address] [place], [place] [id number] Rush Farmer MD [id number]    [place]  Final    Comment: Performed at [facility], [place] [address], [place], [place] [address]  Respiratory Panel by RT PCR (Flu A&B, Covid) - Nasopharyngeal Swab     Status: None   Collection Time: 12/03/19  9:21 PM   Specimen: Nasopharyngeal Swab  Result Value Ref Range Status   SARS Coronavirus 2 by RT PCR NEGATIVE NEGATIVE Final    Comment: (NOTE) SARS-CoV-2 target nucleic acids are NOT DETECTED. The SARS-CoV-2 RNA is generally detectable in upper respiratoy specimens during the acute phase of infection. The lowest concentration of SARS-CoV-2 viral copies this assay can detect is 131 copies/mL. A negative result does not preclude SARS-Cov-2 infection and should not be used as the sole basis for treatment or other patient management decisions. A negative result may occur with  improper specimen collection/handling, submission of specimen other than nasopharyngeal swab, presence of viral mutation(s) within the areas targeted by this assay, and inadequate number of viral copies (<131 copies/mL). A negative result must be combined with clinical observations, patient history, and epidemiological information. The  expected result is Negative. Fact Sheet for Patients:  PinkCheek.be Fact Sheet for Healthcare Providers:  GravelBags.it This test is not yet ap proved or cleared by the [place] FDA and  has been authorized for detection and/or diagnosis of SARS-CoV-2 by FDA under an Emergency Use Authorization (EUA). This EUA will remain  in effect (meaning this test can be used) for the duration of the COVID-19 declaration under Section 564(b)(1) of the Act, 21 U.S.C. section 360bbb-3(b)(1), unless the authorization is terminated or revoked sooner.    Influenza A by PCR  NEGATIVE NEGATIVE Final   Influenza B by PCR NEGATIVE NEGATIVE Final    Comment: (NOTE) The Xpert Xpress SARS-CoV-2/FLU/RSV assay is intended as an aid in  the diagnosis of influenza from Nasopharyngeal swab specimens and  should not be used as a sole basis for treatment. Nasal washings and  aspirates are unacceptable for Xpert Xpress SARS-CoV-2/FLU/RSV  testing. Fact Sheet for Patients: PinkCheek.be Fact Sheet for Healthcare Providers: GravelBags.it This test is not yet approved or cleared by the [place] FDA and  has been authorized for detection and/or diagnosis of SARS-CoV-2 by  FDA under an Emergency Use Authorization (EUA). This EUA will remain  in effect (meaning this test can be used) for the duration of the  Covid-19 declaration under Section 564(b)(1) of the Act, 21  U.S.C. section 360bbb-3(b)(1), unless the authorization is  terminated or revoked. Performed at [facility], [place] [address], [place], [place] [address]     Radiology Reports DG Chest [place] 1 View  Result Date: 12/03/2019 CLINICAL DATA:  Weakness EXAM: PORTABLE CHEST 1 VIEW COMPARISON:  2013 FINDINGS: Rotated. Patchy density at the lung bases. No pleural effusion or pneumothorax. Cardiomediastinal contours are within normal  limits for patient positioning. Lower thoracic scoliosis. IMPRESSION: Mild patchy atelectasis/consolidation or scarring at the lung bases. Electronically Signed   By: Macy Mis M.D.   On: 12/03/2019 21:47     CBC Recent Labs  Lab 12/03/19 1938 12/04/19 0547  WBC 6.6 5.8  HGB 11.6* 10.2*  HCT 35.4* 31.7*  PLT 310 324  MCV 88.7 88.8  MCH 29.1 28.6  MCHC 32.8 32.2  RDW 15.7* 15.7*    Chemistries  Recent Labs  Lab 12/03/19 1938 12/04/19 0547  NA 139  --   K 4.5  --   CL 109  --   CO2 17*  --   GLUCOSE 106*  --   BUN 33*  --   CREATININE 2.43* 2.10*  CALCIUM 9.1  --    ------------------------------------------------------------------------------------------------------------------ No results for input(s): CHOL, HDL, LDLCALC, TRIG, CHOLHDL, LDLDIRECT in the last 72 hours.  Lab Results  Component Value Date   HGBA1C 5.5 07/07/2017   ------------------------------------------------------------------------------------------------------------------ No results for input(s): TSH, T4TOTAL, T3FREE, THYROIDAB in the last 72 hours.  Invalid input(s): FREET3 ------------------------------------------------------------------------------------------------------------------ Recent Labs    12/04/19 0547  FERRITIN 333*    Coagulation profile No results for input(s): INR, PROTIME in the last 168 hours.  Recent Labs    12/04/19 0547  DDIMER >20.00*    Cardiac Enzymes No results for input(s): CKMB, TROPONINI, MYOGLOBIN in the last 168 hours.  Invalid input(s): CK ------------------------------------------------------------------------------------------------------------------ No results found for: BNP  -send out Covid test done by patient's PCP couple days prior to admission apparently came back positive) -Patient's daughter and patient's nephew who lives with patient both are positive for Covid as well  Roxan Hockey M.D on 12/04/2019 at 3:18 PM  Go to  [email or web address] - for contact info  [facility] - Office  [phone]

## 2019-12-04 NOTE — ED Notes (Signed)
Report given to caitlin rn on 6n

## 2019-12-04 NOTE — Progress Notes (Signed)
Patient transferred to 2W32 via bed.

## 2019-12-04 NOTE — Plan of Care (Signed)

## 2019-12-04 NOTE — ED Notes (Signed)
Ordered a hospital bed--[name]  

## 2019-12-04 NOTE — ED Notes (Signed)
Regular hospital bed requested

## 2019-12-04 NOTE — ED Notes (Signed)
Calling her family

## 2019-12-04 NOTE — ED Notes (Signed)
Ordered breakfast--[name] 

## 2019-12-04 NOTE — ED Notes (Signed)
The pt is c/o her lt a-c hurting after the last part of her antibiotic infused  Iv site irrigated and it irrigated it ok  No redness swelling or hardness at   The site  It does not appear infiltrated   Heat pack given to the  pt

## 2019-12-04 NOTE — ED Notes (Signed)
The sars coronavirus has already bee collected and resulted negative

## 2019-12-05 DIAGNOSIS — U071 COVID-19: Principal | ICD-10-CM

## 2019-12-05 DIAGNOSIS — J1282 Pneumonia due to coronavirus disease 2019: Secondary | ICD-10-CM

## 2019-12-05 LAB — COMPREHENSIVE METABOLIC PANEL
ALT: 14 U/L (ref 0–44)
AST: 27 U/L (ref 15–41)
Albumin: 2.5 g/dL — ABNORMAL LOW (ref 3.5–5.0)
Alkaline Phosphatase: 54 U/L (ref 38–126)
Anion gap: 11 (ref 5–15)
BUN: 26 mg/dL — ABNORMAL HIGH (ref 8–23)
CO2: 18 mmol/L — ABNORMAL LOW (ref 22–32)
Calcium: 9.1 mg/dL (ref 8.9–10.3)
Chloride: 115 mmol/L — ABNORMAL HIGH (ref 98–111)
Creatinine, Ser: 1.61 mg/dL — ABNORMAL HIGH (ref 0.44–1.00)
GFR calc Af Amer: 34 mL/min — ABNORMAL LOW (ref >60)
GFR calc non Af Amer: 29 mL/min — ABNORMAL LOW (ref >60)
Glucose, Bld: 126 mg/dL — ABNORMAL HIGH (ref 70–99)
Potassium: 5.1 mmol/L (ref 3.5–5.1)
Sodium: 144 mmol/L (ref 135–145)
Total Bilirubin: 0.6 mg/dL (ref 0.3–1.2)
Total Protein: 7.5 g/dL (ref 6.5–8.1)

## 2019-12-05 LAB — CBC WITH DIFFERENTIAL/PLATELET
Abs Immature Granulocytes: 0.06 10*3/uL (ref 0.00–0.07)
Basophils Absolute: 0 10*3/uL (ref 0.0–0.1)
Basophils Relative: 0 %
Eosinophils Absolute: 0 10*3/uL (ref 0.0–0.5)
Eosinophils Relative: 0 %
HCT: 33.3 % — ABNORMAL LOW (ref 36.0–46.0)
Hemoglobin: 10.7 g/dL — ABNORMAL LOW (ref 12.0–15.0)
Immature Granulocytes: 1 %
Lymphocytes Relative: 23 %
Lymphs Abs: 1 10*3/uL (ref 0.7–4.0)
MCH: 28.2 pg (ref 26.0–34.0)
MCHC: 32.1 g/dL (ref 30.0–36.0)
MCV: 87.6 fL (ref 80.0–100.0)
Monocytes Absolute: 0.1 10*3/uL (ref 0.1–1.0)
Monocytes Relative: 2 %
Neutro Abs: 3.1 10*3/uL (ref 1.7–7.7)
Neutrophils Relative %: 74 %
Platelets: 358 10*3/uL (ref 150–400)
RBC: 3.8 MIL/uL — ABNORMAL LOW (ref 3.87–5.11)
RDW: 15.9 % — ABNORMAL HIGH (ref 11.5–15.5)
WBC: 4.4 10*3/uL (ref 4.0–10.5)
nRBC: 0 % (ref 0.0–0.2)

## 2019-12-05 LAB — URINALYSIS, ROUTINE W REFLEX MICROSCOPIC
Bacteria, UA: NONE SEEN
Bilirubin Urine: NEGATIVE
Glucose, UA: NEGATIVE mg/dL
Ketones, ur: NEGATIVE mg/dL
Leukocytes,Ua: NEGATIVE
Nitrite: NEGATIVE
Protein, ur: 100 mg/dL — AB
Specific Gravity, Urine: 1.011 (ref 1.005–1.030)
pH: 5 (ref 5.0–8.0)

## 2019-12-05 LAB — FERRITIN: Ferritin: 409 ng/mL — ABNORMAL HIGH (ref 11–307)

## 2019-12-05 LAB — C-REACTIVE PROTEIN: CRP: 11.7 mg/dL — ABNORMAL HIGH

## 2019-12-05 LAB — D-DIMER, QUANTITATIVE: D-Dimer, Quant: 1.96 ug/mL-FEU — ABNORMAL HIGH (ref 0.00–0.50)

## 2019-12-05 MED ORDER — AMLODIPINE BESYLATE 5 MG PO TABS
2.5000 mg | ORAL_TABLET | Freq: Every day | ORAL | Status: DC
  Administered 2019-12-05 – 2019-12-06 (×2): 2.5 mg via ORAL
  Filled 2019-12-05 (×2): qty 1

## 2019-12-05 MED ORDER — VITAMIN D 25 MCG (1000 UNIT) PO TABS
1000.0000 [IU] | ORAL_TABLET | Freq: Every day | ORAL | Status: DC
  Administered 2019-12-05 – 2019-12-06 (×2): 1000 [IU] via ORAL
  Filled 2019-12-05 (×2): qty 1

## 2019-12-05 MED ORDER — AMLODIPINE BESYLATE 10 MG PO TABS: 10.0000 mg | ORAL_TABLET | Freq: Every day | ORAL | Status: DC

## 2019-12-05 NOTE — Progress Notes (Addendum)
PROGRESS NOTE  [name] [id number] DOB: [birth year] DOA: 12/03/2019 PCP: Colon Branch, MD  HPI/Recap of past 76 hours: 85 year old female with past medical history relevant for HTN, CKD3A, Single R kidney, Hypothyroidism s/p ablation, Glaucoma, H/o Gout admitted with fevers, fatigue and generalized weakness and found to have COVID-pneumonia (send out Covid test done by patient's PCP couple days prior to admission apparently came back positive).  Started on IV Solu-Medrol for suspected COVID-19 pneumonitis and transferred to [facility] due to worsening renal function.  12/05/19: Seen and examined.  Reports her weakness is improved.  Afebrile overnight.  PT to assess.  Assessment/Plan: Principal Problem:   Pneumonia due to COVID-19 virus Active Problems:   Hypothyroidism   Essential hypertension   CKD (chronic kidney disease) stage 3, GFR 30-59 ml/min   AKI (acute kidney injury) ([place])   Acute respiratory disease due to COVID-19 virus  Acute COVID-19 viral infection with suspected pneumonitis Per report out of facility positive COVID-19 test on 12/01/2019. Started on IV Solu-Medrol for 5 days, day number 2 out of 5 Not hypoxic with O2 saturation 98 to 100% on room air Continue vitamin D3, vitamin C, zinc Afebrile with negative procalcitonin, will DC IV antibiotics. Personally reviewed chest x-ray done on admission and compared with chest x-ray from 2013 no significant changes. Elevated inflammatory markers, trend  AKI on CKD 3A Baseline creatinine appears to be 1.7 with GFR of 34 Presented with creatinine of 2.4 with GFR of 18 Creatinine is trending down Avoid nephrotoxins and hypotension Monitor urine output Daily BMPs  Generalized weakness likely secondary to acute COVID-19 infection UA unremarkable Urine culture pending PT to assess Mobilize as tolerated with fall precautions  Hypothyroidism Resume levothyroxine  Essential hypertension Hold off losartan due  to AKI Continue amlodipine, lower dose to 2.5 mg daily for now Continue to monitor vital signs     Code Status: Full code  Family Communication: None at bedside  Disposition Plan: Possible DC to home in the next 24 to 48 hours.  Pending PT evaluation.   Consultants:  None  Procedures:  None  Antimicrobials:  None  DVT prophylaxis: Subcu heparin 3 times daily   Objective: Vitals:   12/04/19 2000 12/05/19 0000 12/05/19 0315 12/05/19 1000  BP: (!) 144/71 133/72 121/64 130/72  Pulse: 72 73    Resp: (!) 30 (!) 31    Temp:   97.9 F (36.6 C) 97.6 F (36.4 C)  TempSrc:   Oral Oral  SpO2: 98% 99%    Weight:      Height:        Intake/Output Summary (Last 24 hours) at 12/05/2019 1318 Last data filed at 12/05/2019 0300 Gross per 24 hour  Intake 1143.33 ml  Output --  Net 1143.33 ml   Filed Weights   12/04/19 0145  Weight: 60.6 kg    Exam:  . General: 85 y.o. year-old female well developed well nourished in no acute distress.  Alert and interactive. . Cardiovascular: Regular rate and rhythm with no rubs or gallops.  No thyromegaly or JVD noted.   Marland Kitchen Respiratory: Clear to auscultation with no wheezes or rales. Good inspiratory effort. . Abdomen: Soft nontender nondistended with normal bowel sounds x4 quadrants. . Musculoskeletal: No lower extremity edema. 2/4 pulses in all 4 extremities. Marland Kitchen Psychiatry: Mood is appropriate for condition and setting   Data Reviewed: CBC: Recent Labs  Lab 12/03/19 1938 12/04/19 0547 12/05/19 0336  WBC 6.6 5.8 4.4  NEUTROABS  --   --  3.1  HGB 11.6* 10.2* 10.7*  HCT 35.4* 31.7* 33.3*  MCV 88.7 88.8 87.6  PLT 310 324 [id number]   Basic Metabolic Panel: Recent Labs  Lab 12/03/19 1938 12/04/19 0547 12/05/19 0336  NA 139  --  144  K 4.5  --  5.1  CL 109  --  115*  CO2 17*  --  18*  GLUCOSE 106*  --  126*  BUN 33*  --  26*  CREATININE 2.43* 2.10* 1.61*  CALCIUM 9.1  --  9.1   GFR: Estimated Creatinine Clearance: 24.2  mL/min (A) (by C-G formula based on SCr of 1.61 mg/dL (H)). Liver Function Tests: Recent Labs  Lab 12/05/19 0336  AST 27  ALT 14  ALKPHOS 54  BILITOT 0.6  PROT 7.5  ALBUMIN 2.5*   No results for input(s): LIPASE, AMYLASE in the last 168 hours. No results for input(s): AMMONIA in the last 168 hours. Coagulation Profile: No results for input(s): INR, PROTIME in the last 168 hours. Cardiac Enzymes: No results for input(s): CKTOTAL, CKMB, CKMBINDEX, TROPONINI in the last 168 hours. BNP (last 3 results) No results for input(s): PROBNP in the last 8760 hours. HbA1C: No results for input(s): HGBA1C in the last 72 hours. CBG: No results for input(s): GLUCAP in the last 168 hours. Lipid Profile: No results for input(s): CHOL, HDL, LDLCALC, TRIG, CHOLHDL, LDLDIRECT in the last 72 hours. Thyroid Function Tests: No results for input(s): TSH, T4TOTAL, FREET4, T3FREE, THYROIDAB in the last 72 hours. Anemia Panel: Recent Labs    12/04/19 0547 12/05/19 0336  FERRITIN 333* 409*   Urine analysis:    Component Value Date/Time   COLORURINE YELLOW 12/05/2019 [place] 12/05/2019 1027   LABSPEC 1.011 12/05/2019 1027   PHURINE 5.0 12/05/2019 1027   GLUCOSEU NEGATIVE 12/05/2019 1027   HGBUR SMALL (A) 12/05/2019 1027   HGBUR negative 05/31/2007 1355   BILIRUBINUR NEGATIVE 12/05/2019 1027   KETONESUR NEGATIVE 12/05/2019 1027   PROTEINUR 100 (A) 12/05/2019 1027   UROBILINOGEN 0.2 10/24/2012 1835   NITRITE NEGATIVE 12/05/2019 1027   LEUKOCYTESUR NEGATIVE 12/05/2019 1027   Sepsis Labs: @LABRCNTIP (procalcitonin:4,lacticidven:4)  ) Recent Results (from the past 240 hour(s))  Novel Coronavirus, NAA (Hosp order, Send-out to Ref Lab; TAT 18-24 hrs     Status: Abnormal   Collection Time: 12/03/19  6:53 PM   Specimen: Nasopharyngeal Swab; Respiratory  Result Value Ref Range Status   SARS-CoV-2, NAA DETECTED (A) NOT DETECTED Final    Comment: (NOTE)                  Client  Requested Flag This nucleic acid amplification test was developed and its performance characteristics determined by [employer]. Nucleic acid amplification tests include PCR and TMA. This test has not been FDA cleared or approved. This test has been authorized by FDA under an Emergency Use Authorization (EUA). This test is only authorized for the duration of time the declaration that circumstances exist justifying the authorization of the emergency use of in vitro diagnostic tests for detection of SARS-CoV-2 virus and/or diagnosis of COVID-19 infection under section 564(b)(1) of the Act, 21 U.S.C. [id number]) (1), unless the authorization is terminated or revoked sooner. When diagnostic testing is negative, the possibility of a false negative result should be considered in the context of a patient's recent exposures and the presence of clinical signs and symptoms consistent with COVID-19. An individual without symptoms of COVID-  19 and who is not shedding SARS-CoV-2 virus  would expect to have a negative (not detected) result in this assay. Performed At: [facility] [address] [place], [place] [id number] Rush Farmer MD [id number]    [place]  Final    Comment: Performed at [facility], [place] [address] [place], [place] [address]  Respiratory Panel by RT PCR (Flu A&B, Covid) - Nasopharyngeal Swab     Status: None   Collection Time: 12/03/19  9:21 PM   Specimen: Nasopharyngeal Swab  Result Value Ref Range Status   SARS Coronavirus 2 by RT PCR NEGATIVE NEGATIVE Final    Comment: (NOTE) SARS-CoV-2 target nucleic acids are NOT DETECTED. The SARS-CoV-2 RNA is generally detectable in upper respiratoy specimens during the acute phase of infection. The lowest concentration of SARS-CoV-2 viral copies this assay can detect is 131 copies/mL. A negative result does not preclude SARS-Cov-2 infection and should not be used as the sole  basis for treatment or other patient management decisions. A negative result may occur with  improper specimen collection/handling, submission of specimen other than nasopharyngeal swab, presence of viral mutation(s) within the areas targeted by this assay, and inadequate number of viral copies (<131 copies/mL). A negative result must be combined with clinical observations, patient history, and epidemiological information. The expected result is Negative. Fact Sheet for Patients:  PinkCheek.be Fact Sheet for Healthcare Providers:  GravelBags.it This test is not yet ap proved or cleared by the [place] FDA and  has been authorized for detection and/or diagnosis of SARS-CoV-2 by FDA under an Emergency Use Authorization (EUA). This EUA will remain  in effect (meaning this test can be used) for the duration of the COVID-19 declaration under Section 564(b)(1) of the Act, 21 U.S.C. section 360bbb-3(b)(1), unless the authorization is terminated or revoked sooner.    Influenza A by PCR NEGATIVE NEGATIVE Final   Influenza B by PCR NEGATIVE NEGATIVE Final    Comment: (NOTE) The Xpert Xpress SARS-CoV-2/FLU/RSV assay is intended as an aid in  the diagnosis of influenza from Nasopharyngeal swab specimens and  should not be used as a sole basis for treatment. Nasal washings and  aspirates are unacceptable for Xpert Xpress SARS-CoV-2/FLU/RSV  testing. Fact Sheet for Patients: PinkCheek.be Fact Sheet for Healthcare Providers: GravelBags.it This test is not yet approved or cleared by the [place] FDA and  has been authorized for detection and/or diagnosis of SARS-CoV-2 by  FDA under an Emergency Use Authorization (EUA). This EUA will remain  in effect (meaning this test can be used) for the duration of the  Covid-19 declaration under Section 564(b)(1) of the Act, 21  U.S.C.  section 360bbb-3(b)(1), unless the authorization is  terminated or revoked. Performed at [facility], [place] [address], [place], [place] [address]       Studies: No results found.  Scheduled Meds: . albuterol  2 puff Inhalation Q6H  . vitamin C  500 mg Oral Daily  . aspirin  81 mg Oral Daily  . guaiFENesin  600 mg Oral BID  . heparin injection (subcutaneous)  5,000 Units Subcutaneous Q8H  . latanoprost  1 drop Both Eyes QHS  . levothyroxine  75 mcg Oral QAC breakfast  . methylPREDNISolone (SOLU-MEDROL) injection  40 mg Intravenous Q12H  . multivitamin with minerals  1 tablet Oral Daily  . zinc sulfate  220 mg Oral Daily    Continuous Infusions: . sodium chloride 40 mL/hr at 12/04/19 1936  . azithromycin 500 mg (12/05/19 0116)  . cefTRIAXone (ROCEPHIN)  IV 1 g (12/04/19 2232)     LOS: 1 day     Kayleen Memos, MD [facility] Pager [phone]  If 7PM-7AM, please contact night-coverage [email or web address] Password [email or web address] 12/05/2019, 1:18 PM

## 2019-12-05 NOTE — Progress Notes (Signed)
Spoke with pt's daughter, Donella Stade. She had no questions at this time and this RN confirmed that pt is potentially discharging if PT clears her with their eval.

## 2019-12-05 NOTE — Progress Notes (Signed)
Updated the patient's daughter Crystal via phone.  All questions answered.  Informed possible dc to home tomorrow pending PT evaluation.  She understands and agrees to plan.

## 2019-12-06 LAB — COMPREHENSIVE METABOLIC PANEL
ALT: 14 U/L (ref 0–44)
AST: 22 U/L (ref 15–41)
Albumin: 2.4 g/dL — ABNORMAL LOW (ref 3.5–5.0)
Alkaline Phosphatase: 56 U/L (ref 38–126)
Anion gap: 6 (ref 5–15)
BUN: 29 mg/dL — ABNORMAL HIGH (ref 8–23)
CO2: 21 mmol/L — ABNORMAL LOW (ref 22–32)
Calcium: 9.2 mg/dL (ref 8.9–10.3)
Chloride: 114 mmol/L — ABNORMAL HIGH (ref 98–111)
Creatinine, Ser: 1.58 mg/dL — ABNORMAL HIGH (ref 0.44–1.00)
GFR calc Af Amer: 35 mL/min — ABNORMAL LOW (ref >60)
GFR calc non Af Amer: 30 mL/min — ABNORMAL LOW (ref >60)
Glucose, Bld: 138 mg/dL — ABNORMAL HIGH (ref 70–99)
Potassium: 4.7 mmol/L (ref 3.5–5.1)
Sodium: 141 mmol/L (ref 135–145)
Total Bilirubin: 0.6 mg/dL (ref 0.3–1.2)
Total Protein: 7.3 g/dL (ref 6.5–8.1)

## 2019-12-06 LAB — CBC WITH DIFFERENTIAL/PLATELET
Abs Immature Granulocytes: 0.12 10*3/uL — ABNORMAL HIGH (ref 0.00–0.07)
Basophils Absolute: 0 10*3/uL (ref 0.0–0.1)
Basophils Relative: 0 %
Eosinophils Absolute: 0 10*3/uL (ref 0.0–0.5)
Eosinophils Relative: 0 %
HCT: 30.8 % — ABNORMAL LOW (ref 36.0–46.0)
Hemoglobin: 10.1 g/dL — ABNORMAL LOW (ref 12.0–15.0)
Immature Granulocytes: 1 %
Lymphocytes Relative: 11 %
Lymphs Abs: 1.4 10*3/uL (ref 0.7–4.0)
MCH: 28.5 pg (ref 26.0–34.0)
MCHC: 32.8 g/dL (ref 30.0–36.0)
MCV: 86.8 fL (ref 80.0–100.0)
Monocytes Absolute: 0.3 10*3/uL (ref 0.1–1.0)
Monocytes Relative: 3 %
Neutro Abs: 11.1 10*3/uL — ABNORMAL HIGH (ref 1.7–7.7)
Neutrophils Relative %: 85 %
Platelets: 430 10*3/uL — ABNORMAL HIGH (ref 150–400)
RBC: 3.55 MIL/uL — ABNORMAL LOW (ref 3.87–5.11)
RDW: 15.7 % — ABNORMAL HIGH (ref 11.5–15.5)
WBC: 12.9 10*3/uL — ABNORMAL HIGH (ref 4.0–10.5)
nRBC: 0 % (ref 0.0–0.2)

## 2019-12-06 LAB — C-REACTIVE PROTEIN: CRP: 4.9 mg/dL — ABNORMAL HIGH (ref <1.0)

## 2019-12-06 LAB — LACTIC ACID, PLASMA: Lactic Acid, Venous: 1.2 mmol/L (ref 0.5–1.9)

## 2019-12-06 LAB — URINE CULTURE
Culture: <10000 — AB
Special Requests: NORMAL

## 2019-12-06 LAB — PROCALCITONIN: Procalcitonin: 0.11 ng/mL

## 2019-12-06 LAB — FERRITIN: Ferritin: 370 ng/mL — ABNORMAL HIGH (ref 11–307)

## 2019-12-06 LAB — D-DIMER, QUANTITATIVE: D-Dimer, Quant: 1.45 ug/mL-FEU — ABNORMAL HIGH (ref 0.00–0.50)

## 2019-12-06 MED ORDER — AMLODIPINE BESYLATE 5 MG PO TABS: 5.0000 mg | ORAL_TABLET | Freq: Every day | ORAL | 0 refills | Status: DC

## 2019-12-06 MED ORDER — VITAMIN D3 25 MCG PO TABS: 1000.0000 [IU] | ORAL_TABLET | Freq: Every day | ORAL | 0 refills | Status: AC

## 2019-12-06 MED ORDER — ASCORBIC ACID 500 MG PO TABS: 500.0000 mg | ORAL_TABLET | Freq: Every day | ORAL | 0 refills | Status: AC

## 2019-12-06 MED ORDER — DEXAMETHASONE 6 MG PO TABS: 6.0000 mg | ORAL_TABLET | Freq: Every day | ORAL | 0 refills | Status: AC

## 2019-12-06 MED ORDER — ALBUTEROL SULFATE HFA 108 (90 BASE) MCG/ACT IN AERS
2.0000 | INHALATION_SPRAY | Freq: Four times a day (QID) | RESPIRATORY_TRACT | Status: DC | PRN
  Filled 2019-12-06: qty 6.7

## 2019-12-06 MED ORDER — ZINC SULFATE 220 (50 ZN) MG PO CAPS: 220.0000 mg | ORAL_CAPSULE | Freq: Every day | ORAL | 0 refills | Status: DC

## 2019-12-06 NOTE — Progress Notes (Signed)
Daily Progress note  Patient is awake and pleasant at bedside. Awaiting D/C today.

## 2019-12-06 NOTE — Discharge Summary (Signed)
Discharge Summary  AOI GODETTE [id number] DOB: Jul 07, 1935  PCP: Colon Branch, MD  Admit date: 12/03/2019 Discharge date: 12/06/2019  Time spent: 35 minutes  Recommendations for Outpatient Follow-up:  1. Follow-up with your primary care provider 2. Take your medications as prescribed 3. Quarantine for 21 days from positive COVID-19 test on 12/01/2019 through 12/22/2019.  Discharge Diagnoses:  Active Hospital Problems   Diagnosis Date Noted  . Pneumonia due to COVID-19 virus 12/04/2019  . AKI (acute kidney injury) ([place]) 12/04/2019  . Acute respiratory disease due to COVID-19 virus 12/04/2019  . CKD (chronic kidney disease) stage 3, GFR 30-59 ml/min 09/23/2010  . Hypothyroidism 06/13/2008  . Essential hypertension 05/31/2007    Resolved Hospital Problems  No resolved problems to display.    Discharge Condition: Stable  Diet recommendation: Heart healthy diet.  Vitals:   12/06/19 1251 12/06/19 1513  BP: 133/83 (!) 154/68  Pulse: 62 88  Resp: (!) 26 (!) 24  Temp: (!) 96.8 F (36 C) 97.6 F (36.4 C)  SpO2: 100% 100%    History of present illness:  85 year old female with past medical history relevant forHTN, CKD3A, Single R kidney, Hypothyroidism s/p ablation, Glaucoma,H/o Goutadmitted with fevers, fatigue and generalized weakness and found to have COVID-19 viral infection(send out Covid test done by patient's PCP couple days prior to admission apparently came back positive).  Started on IV Solu-Medrol for suspected COVID-19 pneumonitis and transferred to [facility] due to worsening renal function.  Upon arrival her renal function improved and she felt better.  PT assessed and recommended home health PT.  Vital signs and labs reviewed and are stable.  O2 saturation 100% on room air.  12/06/19: Seen and examined.  She has no new complaints.  She wants to go home.  She lives at home with her daughter Donella Stade who informed me via phone on 12/05/2019 that she also  tested positive.  On the day of discharge, the patient was hemodynamically stable.  She will need to follow-up with her primary care provider posthospitalization.  She will also need to continue physical therapy once out of quarantine window as stated above.   Hospital Course:  Principal Problem:   Pneumonia due to COVID-19 virus Active Problems:   Hypothyroidism   Essential hypertension   CKD (chronic kidney disease) stage 3, GFR 30-59 ml/min   AKI (acute kidney injury) ([place])   Acute respiratory disease due to COVID-19 virus   Acute COVID-19 viral infection with suspected pneumonitis Per report out of facility positive COVID-19 test on 12/01/2019. Completed 3 days of IV Solu-Medrol. Will continue with Decadron 6 mg daily x5 days. O2 saturation stable 100% on room air. Continue vitamin D3, vitamin C, zinc Afebrile with negative procalcitonin. Inflammatory markers trending down.  Resolved AKI on CKD 3A Baseline creatinine appears to be 1.5 with GFR of 30 Presented with creatinine of 2.4 with GFR of 18 Renal function is back to baseline. Continue to avoid nephrotoxins. Follow-up with your primary care provider.    Generalized weakness likely secondary to acute COVID-19 infection UA unremarkable Urine culture >>insignificant growth final. PT recommended home health PT. Continue fall precautions.  Hypothyroidism Continue levothyroxine  Essential hypertension Continue to hold off losartan due to AKI Continue amlodipine 5 mg daily. Follow-up with your primary care provider.     Code Status: Full code   Consultants:  None  Procedures:  None   Discharge Exam: BP (!) 154/68 (BP Location: Right Arm)   Pulse 88  Temp 97.6 F (36.4 C) (Axillary)   Resp (!) 24   Ht 5\' 5"  (1.651 m)   Wt 60.6 kg   SpO2 100%   BMI 22.23 kg/m  . General: 85 y.o. year-old female well developed well nourished in no acute distress.  Alert and  interactive. . Cardiovascular: Regular rate and rhythm with no rubs or gallops.  No thyromegaly or JVD noted.   Marland Kitchen Respiratory: Clear to auscultation with no wheezes or rales. Good inspiratory effort. . Abdomen: Soft nontender nondistended with normal bowel sounds x4 quadrants. . Musculoskeletal: No lower extremity edema. 2/4 pulses in all 4 extremities. Marland Kitchen Psychiatry: Mood is appropriate for condition and setting  Discharge Instructions You were cared for by a hospitalist during your hospital stay. If you have any questions about your discharge medications or the care you received while you were in the hospital after you are discharged, you can call the unit and asked to speak with the hospitalist on call if the hospitalist that took care of you is not available. Once you are discharged, your primary care physician will handle any further medical issues. Please note that NO REFILLS for any discharge medications will be authorized once you are discharged, as it is imperative that you return to your primary care physician (or establish a relationship with a primary care physician if you do not have one) for your aftercare needs so that they can reassess your need for medications and monitor your lab values.   Allergies as of 12/06/2019   No Known Allergies     Medication List    STOP taking these medications   losartan 50 MG tablet Commonly known as: COZAAR     TAKE these medications   acetaminophen 500 MG tablet Commonly known as: TYLENOL Take 500-1,000 mg by mouth every 6 (six) hours as needed for fever or headache (pain).   amLODipine 5 MG tablet Commonly known as: NORVASC Take 1 tablet (5 mg total) by mouth daily. Start taking on: December 07, 2019 What changed:   medication strength  how much to take   ascorbic acid 500 MG tablet Commonly known as: VITAMIN C Take 1 tablet (500 mg total) by mouth daily. Start taking on: December 07, 2019   aspirin EC 81 MG tablet Take 81 mg  by mouth daily.   dexamethasone 6 MG tablet Commonly known as: Decadron Take 1 tablet (6 mg total) by mouth daily for 5 days.   latanoprost 0.005 % ophthalmic solution Commonly known as: XALATAN Place 1 drop into both eyes at bedtime.   levothyroxine 75 MCG tablet Commonly known as: SYNTHROID TAKE 1 TABLET EVERY DAY BEFORE BREAKFAST What changed: See the new instructions.   Vitamin D3 25 MCG tablet Commonly known as: Vitamin D Take 1 tablet (1,000 Units total) by mouth daily. Start taking on: December 07, 2019   zinc sulfate 220 (50 Zn) MG capsule Take 1 capsule (220 mg total) by mouth daily. Start taking on: December 07, 2019      No Known Allergies Follow-up Information    Colon Branch, MD. Call in 1 day(s).   Specialty: Internal Medicine Why: Please call for a post hospital follow-up appointment. Contact information: [address] [place] [address] [phone]        [facility] Follow up.   Specialty: [place] Why: The office will call to schedule visits for therapy Contact information: [place] [address]  [phone]            The results of significant diagnostics from this hospitalization (including imaging, microbiology, ancillary and laboratory) are listed below for reference.    Significant Diagnostic Studies: DG Chest Port 1 View  Result Date: 12/03/2019 CLINICAL DATA:  Weakness EXAM: PORTABLE CHEST 1 VIEW COMPARISON:  2013 FINDINGS: Rotated. Patchy density at the lung bases. No pleural effusion or pneumothorax. Cardiomediastinal contours are within normal limits for patient positioning. Lower thoracic scoliosis. IMPRESSION: Mild patchy atelectasis/consolidation or scarring at the lung bases. Electronically Signed   By: Macy Mis M.D.   On: 12/03/2019 21:47    Microbiology: Recent Results (from the past 240 hour(s))  Novel Coronavirus, NAA (Hosp order, Send-out to Ref Lab; TAT  18-24 hrs     Status: Abnormal   Collection Time: 12/03/19  6:53 PM   Specimen: Nasopharyngeal Swab; Respiratory  Result Value Ref Range Status   SARS-CoV-2, NAA DETECTED (A) NOT DETECTED Final    Comment: (NOTE)                  Client Requested Flag This nucleic acid amplification test was developed and its performance characteristics determined by [employer]. Nucleic acid amplification tests include PCR and TMA. This test has not been FDA cleared or approved. This test has been authorized by FDA under an Emergency Use Authorization (EUA). This test is only authorized for the duration of time the declaration that circumstances exist justifying the authorization of the emergency use of in vitro diagnostic tests for detection of SARS-CoV-2 virus and/or diagnosis of COVID-19 infection under section 564(b)(1) of the Act, 21 U.S.C. PT:[id number]) (1), unless the authorization is terminated or revoked sooner. When diagnostic testing is negative, the possibility of a false negative result should be considered in the context of a patient's recent exposures and the presence of clinical signs and symptoms consistent with COVID-19. An individual without symptoms of COVID-  19 and who is not shedding SARS-CoV-2 virus would expect to have a negative (not detected) result in this assay. Performed At: [facility] [address] [place], [place] [id number] Rush Farmer MD [id number]    [place]  Final    Comment: Performed at [facility], [place] [address], [place], [place] [address]  Respiratory Panel by RT PCR (Flu A&B, Covid) - Nasopharyngeal Swab     Status: None   Collection Time: 12/03/19  9:21 PM   Specimen: Nasopharyngeal Swab  Result Value Ref Range Status   SARS Coronavirus 2 by RT PCR NEGATIVE NEGATIVE Final    Comment: (NOTE) SARS-CoV-2 target nucleic acids are NOT DETECTED. The SARS-CoV-2 RNA is generally detectable in upper  respiratoy specimens during the acute phase of infection. The lowest concentration of SARS-CoV-2 viral copies this assay can detect is 131 copies/mL. A negative result does not preclude SARS-Cov-2 infection and should not be used as the sole basis for treatment or other patient management decisions. A negative result may occur with  improper specimen collection/handling, submission of specimen other than nasopharyngeal swab, presence of viral mutation(s) within the areas targeted by this assay, and inadequate number of viral copies (<131 copies/mL). A negative result must be combined with clinical observations, patient history, and epidemiological information. The expected result is Negative. Fact Sheet for Patients:  PinkCheek.be Fact Sheet for Healthcare Providers:  GravelBags.it This test is not yet ap proved or cleared by the [place] FDA and  has been authorized for detection and/or diagnosis of  SARS-CoV-2 by FDA under an Emergency Use Authorization (EUA). This EUA will remain  in effect (meaning this test can be used) for the duration of the COVID-19 declaration under Section 564(b)(1) of the Act, 21 U.S.C. section 360bbb-3(b)(1), unless the authorization is terminated or revoked sooner.    Influenza A by PCR NEGATIVE NEGATIVE Final   Influenza B by PCR NEGATIVE NEGATIVE Final    Comment: (NOTE) The Xpert Xpress SARS-CoV-2/FLU/RSV assay is intended as an aid in  the diagnosis of influenza from Nasopharyngeal swab specimens and  should not be used as a sole basis for treatment. Nasal washings and  aspirates are unacceptable for Xpert Xpress SARS-CoV-2/FLU/RSV  testing. Fact Sheet for Patients: PinkCheek.be Fact Sheet for Healthcare Providers: GravelBags.it This test is not yet approved or cleared by the [place] FDA and  has been authorized for  detection and/or diagnosis of SARS-CoV-2 by  FDA under an Emergency Use Authorization (EUA). This EUA will remain  in effect (meaning this test can be used) for the duration of the  Covid-19 declaration under Section 564(b)(1) of the Act, 21  U.S.C. section 360bbb-3(b)(1), unless the authorization is  terminated or revoked. Performed at [facility], [place] [address], [place], [place] [address]   Culture, Urine     Status: Abnormal   Collection Time: 12/05/19 11:25 AM   Specimen: Urine, Clean Catch  Result Value Ref Range Status   Specimen Description URINE, CLEAN CATCH  Final   Special Requests Normal  Final   Culture (A)  Final    <10,000 COLONIES/mL INSIGNIFICANT GROWTH Performed at [facility], [place] [address] [place],  [address]    Report Status 12/06/2019 FINAL  Final     Labs: Basic Metabolic Panel: Recent Labs  Lab 12/03/19 1938 12/04/19 0547 12/05/19 0336 12/06/19 0352  NA 139  --  144 141  K 4.5  --  5.1 4.7  CL 109  --  115* 114*  CO2 17*  --  18* 21*  GLUCOSE 106*  --  126* 138*  BUN 33*  --  26* 29*  CREATININE 2.43* 2.10* 1.61* 1.58*  CALCIUM 9.1  --  9.1 9.2   Liver Function Tests: Recent Labs  Lab 12/05/19 0336 12/06/19 0352  AST 27 22  ALT 14 14  ALKPHOS 54 56  BILITOT 0.6 0.6  PROT 7.5 7.3  ALBUMIN 2.5* 2.4*   No results for input(s): LIPASE, AMYLASE in the last 168 hours. No results for input(s): AMMONIA in the last 168 hours. CBC: Recent Labs  Lab 12/03/19 1938 12/04/19 0547 12/05/19 0336 12/06/19 0352  WBC 6.6 5.8 4.4 12.9*  NEUTROABS  --   --  3.1 11.1*  HGB 11.6* 10.2* 10.7* 10.1*  HCT 35.4* 31.7* 33.3* 30.8*  MCV 88.7 88.8 87.6 86.8  PLT 310 324 358 430*   Cardiac Enzymes: No results for input(s): CKTOTAL, CKMB, CKMBINDEX, TROPONINI in the last 168 hours. BNP: BNP (last 3 results) No results for input(s): BNP in the last 8760 hours.  ProBNP (last 3 results) No results for input(s): PROBNP in the last  8760 hours.  CBG: No results for input(s): GLUCAP in the last 168 hours.     Signed:  Kayleen Memos, MD [facility] 12/06/2019, 4:16 PM

## 2019-12-06 NOTE — Discharge Instructions (Signed)
Person Under Monitoring Name: [name]  Location: [address] [place] [address]   Infection Prevention Recommendations for Individuals Confirmed to have, or Being Evaluated for, 2019 Novel Coronavirus (COVID-19) Infection Who Receive Care at Home  Individuals who are confirmed to have, or are being evaluated for, COVID-19 should follow the prevention steps below until a healthcare provider or local or state health department says they can return to normal activities.  Stay home except to get medical care You should restrict activities outside your home, except for getting medical care. Do not go to work, school, or public areas, and do not use public transportation or taxis.  Call ahead before visiting your doctor Before your medical appointment, call the healthcare provider and tell them that you have, or are being evaluated for, COVID-19 infection. This will help the healthcare providers office take steps to keep other people from getting infected. Ask your healthcare provider to call the local or state health department.  Monitor your symptoms Seek prompt medical attention if your illness is worsening (e.g., difficulty breathing). Before going to your medical appointment, call the healthcare provider and tell them that you have, or are being evaluated for, COVID-19 infection. Ask your healthcare provider to call the local or state health department.  Wear a facemask You should wear a facemask that covers your nose and mouth when you are in the same room with other people and when you visit a healthcare provider. People who live with or visit you should also wear a facemask while they are in the same room with you.  Separate yourself from other people in your home As much as possible, you should stay in a different room from other people in your home. Also, you should use a separate bathroom, if available.  Avoid sharing household items You should not share  dishes, drinking glasses, cups, eating utensils, towels, bedding, or other items with other people in your home. After using these items, you should wash them thoroughly with soap and water.  Cover your coughs and sneezes Cover your mouth and nose with a tissue when you cough or sneeze, or you can cough or sneeze into your sleeve. Throw used tissues in a lined trash can, and immediately wash your hands with soap and water for at least 20 seconds or use an alcohol-based hand rub.  Wash your [facility] your hands often and thoroughly with soap and water for at least 20 seconds. You can use an alcohol-based hand sanitizer if soap and water are not available and if your hands are not visibly dirty. Avoid touching your eyes, nose, and mouth with unwashed hands.   Prevention Steps for Caregivers and Household Members of Individuals Confirmed to have, or Being Evaluated for, COVID-19 Infection Being Cared for in the Home  If you live with, or provide care at home for, a person confirmed to have, or being evaluated for, COVID-19 infection please follow these guidelines to prevent infection:  Follow healthcare providers instructions Make sure that you understand and can help the patient follow any healthcare provider instructions for all care.  Provide for the patients basic needs You should help the patient with basic needs in the home and provide support for getting groceries, prescriptions, and other personal needs.  Monitor the patients symptoms If they are getting sicker, call his or her medical provider and tell them that the patient has, or is being evaluated for, COVID-19 infection. This will help the healthcare providers office  take steps to keep other people from getting infected. Ask the healthcare provider to call the local or state health department.  Limit the number of people who have contact with the patient  If possible, have only one caregiver for the patient.  Other  household members should stay in another home or place of residence. If this is not possible, they should stay  in another room, or be separated from the patient as much as possible. Use a separate bathroom, if available.  Restrict visitors who do not have an essential need to be in the home.  Keep older adults, very young children, and other sick people away from the patient Keep older adults, very young children, and those who have compromised immune systems or chronic health conditions away from the patient. This includes people with chronic heart, lung, or kidney conditions, diabetes, and cancer.  Ensure good ventilation Make sure that shared spaces in the home have good air flow, such as from an air conditioner or an opened window, weather permitting.  Wash your hands often  Wash your hands often and thoroughly with soap and water for at least 20 seconds. You can use an alcohol based hand sanitizer if soap and water are not available and if your hands are not visibly dirty.  Avoid touching your eyes, nose, and mouth with unwashed hands.  Use disposable paper towels to dry your hands. If not available, use dedicated cloth towels and replace them when they become wet.  Wear a facemask and gloves  Wear a disposable facemask at all times in the room and gloves when you touch or have contact with the patients blood, body fluids, and/or secretions or excretions, such as sweat, saliva, sputum, nasal mucus, vomit, urine, or feces.  Ensure the mask fits over your nose and mouth tightly, and do not touch it during use.  Throw out disposable facemasks and gloves after using them. Do not reuse.  Wash your hands immediately after removing your facemask and gloves.  If your personal clothing becomes contaminated, carefully remove clothing and launder. Wash your hands after handling contaminated clothing.  Place all used disposable facemasks, gloves, and other waste in a lined container before  disposing them with other household waste.  Remove gloves and wash your hands immediately after handling these items.  Do not share dishes, glasses, or other household items with the patient  Avoid sharing household items. You should not share dishes, drinking glasses, cups, eating utensils, towels, bedding, or other items with a patient who is confirmed to have, or being evaluated for, COVID-19 infection.  After the person uses these items, you should wash them thoroughly with soap and water.  Wash laundry thoroughly  Immediately remove and wash clothes or bedding that have blood, body fluids, and/or secretions or excretions, such as sweat, saliva, sputum, nasal mucus, vomit, urine, or feces, on them.  Wear gloves when handling laundry from the patient.  Read and follow directions on labels of laundry or clothing items and detergent. In general, wash and dry with the warmest temperatures recommended on the label.  Clean all areas the individual has used often  Clean all touchable surfaces, such as counters, tabletops, doorknobs, bathroom fixtures, toilets, phones, keyboards, tablets, and bedside tables, every day. Also, clean any surfaces that may have blood, body fluids, and/or secretions or excretions on them.  Wear gloves when cleaning surfaces the patient has come in contact with.  Use a diluted bleach solution (e.g., dilute bleach with 1 part  bleach and 10 parts water) or a household disinfectant with a label that says EPA-registered for coronaviruses. To make a bleach solution at home, add 1 tablespoon of bleach to 1 quart (4 cups) of water. For a larger supply, add  cup of bleach to 1 gallon (16 cups) of water.  Read labels of cleaning products and follow recommendations provided on product labels. Labels contain instructions for safe and effective use of the cleaning product including precautions you should take when applying the product, such as wearing gloves or eye protection  and making sure you have good ventilation during use of the product.  Remove gloves and wash hands immediately after cleaning.  Monitor yourself for signs and symptoms of illness Caregivers and household members are considered close contacts, should monitor their health, and will be asked to limit movement outside of the home to the extent possible. Follow the monitoring steps for close contacts listed on the symptom monitoring form.   ? If you have additional questions, contact your local health department or call the epidemiologist on call at [phone] (available 24/7). ? This guidance is subject to change. For the most up-to-date guidance from [facility], please refer to their website: [email or web address]   Person Under Monitoring Name: [name]  Location: [place] [address]   CORONAVIRUS DISEASE 2019 (COVID-19) Guidance for Persons Under Investigation You are being tested for the virus that causes coronavirus disease 2019 (COVID-19). Public health actions are necessary to ensure protection of your health and the health of others, and to prevent further spread of infection. COVID-19 is caused by a virus that can cause symptoms, such as fever, cough, and shortness of breath. The primary transmission from person to person is by coughing or sneezing. On December 23, 2018, the [place] announced a [employer] Emergency of International Concern and on December 24, 2018 the U.S. Department of Health and Human Services declared a public health emergency. If the virus that causesCOVID-19 spreads in the community, it could have severe public health consequences.  As a person under investigation for COVID-19, the [place] advises you to adhere to the following guidance until your test results are reported to you. If your test result is  positive, you will receive additional information from your provider and your local health department at that time.   Remain at home until you are cleared by your health provider or public health authorities.   Keep a log of visitors to your home using the form provided. Any visitors to your home must be aware of your isolation status.  If you plan to move to a new address or leave the county, notify the local health department in your county.  Call a doctor or seek care if you have an urgent medical need. Before seeking medical care, call ahead and get instructions from the provider before arriving at the medical office, clinic or hospital. Notify them that you are being tested for the virus that causes COVID-19 so arrangements can be made, as necessary, to prevent transmission to others in the healthcare setting. Next, notify the local health department in your county.  If a medical emergency arises and you need to call 911, inform the first responders that you are being tested for the virus that causes COVID-19. Next, notify the local health department in your county.  Adhere to all guidance set forth by the [place]  for Home Care of patients that is based on guidance from the Center for Disease Control and Prevention with suspected or confirmed COVID-19. It is provided with this guidance for Persons Under Investigation.  Your health and the health of our community are our top priorities. Public Health officials remain available to provide assistance and counseling to you about COVID-19 and compliance with this guidance.  Provider: ____________________________________________________________ Date: ______/_____/_________  By signing below, you acknowledge that you have read and agree to comply with this Guidance for Persons Under Investigation. ______________________________________________________________ Date: ______/_____/_________  WHO DO I CALL? You can  find a list of local health departments here: [email or web address] Health Department: ____________________________________________________________________ Contact Name: ________________________________________________________________________ Telephone: ___________________________________________________________________________  [facility], [place], Communicable Disease Branch COVID-19 Guidance for Persons Under Investigation March 7, 2020Prevent the Spread of COVID-19 if You Are Sick If you are sick with COVID-19 or think you might have COVID-19, follow the steps below to care for yourself and to help protect other people in your home and community. Stay home except to get medical care.  Stay home. Most people with COVID-19 have mild illness and are able to recover at home without medical care. Do not leave your home, except to get medical care. Do not visit public areas.  Take care of yourself. Get rest and stay hydrated. Take over-the-counter medicines, such as acetaminophen, to help you feel better.  Stay in touch with your doctor. Call before you get medical care. Be sure to get care if you have trouble breathing, or have any other emergency warning signs, or if you think it is an emergency.  Avoid public transportation, ride-sharing, or taxis. Separate yourself from other people and pets in your home.  As much as possible, stay in a specific room and away from other people and pets in your home. Also, you should use a separate bathroom, if available. If you need to be around other people or animals in or outside of the home, wear a mask. ? See COVID-19 and Animals if you have questions about USFirm.ch. ? Additional guidance is available for those living in close quarters. (http://www.turner-rogers.com/.html)  and shared housing (TVStereos.ch). Monitor your symptoms.  Symptoms of COVID-19 include fever, cough, and shortness of breath but other symptoms may be present as well.  Follow care instructions from your healthcare provider and local health department. Your local health authorities will give instructions on checking your symptoms and reporting information. When to Seek Emergency Medical Attention Look for emergency warning signs* for COVID-19. If someone is showing any of these signs, seek emergency medical care immediately:  Trouble breathing  Persistent pain or pressure in the chest  New confusion  Bluish lips or face  Inability to wake or stay awake *This list is not all possible symptoms. Please call your medical provider for any other symptoms that are severe or concerning to you. Call 911 or call ahead to your local emergency facility: Notify the operator that you are seeking care for someone who has or may have COVID-19. Call ahead before visiting your doctor.  Call ahead. Many medical visits for routine care are being postponed or done by phone or telemedicine.  If you have a medical appointment that cannot be postponed, call your doctor's office, and tell them you have or may have COVID-19. If you are sick, wear a mask over your nose and mouth.  You should wear a mask over your nose and mouth if you must be around other people or  animals, including pets (even at home).  You don't need to wear the mask if you are alone. If you can't put on a mask (because of trouble breathing for example), cover your coughs and sneezes in some other way. Try to stay at least 6 feet away from other people. This will help protect the people around you.  Masks should not be placed on young children under age 65 years, anyone who has trouble breathing, or anyone who is not able to remove the mask without help. Note: During  the COVID-19 pandemic, medical grade facemasks are reserved for healthcare workers and some first responders. You may need to make a mask using a scarf or bandana. Cover your coughs and sneezes.  Cover your mouth and nose with a tissue when you cough or sneeze.  Throw used tissues in a lined trash can.  Immediately wash your hands with soap and water for at least 20 seconds. If soap and water are not available, clean your hands with an alcohol-based hand sanitizer that contains at least 60% alcohol. Clean your hands often.  Wash your hands often with soap and water for at least 20 seconds. This is especially important after blowing your nose, coughing, or sneezing; going to the bathroom; and before eating or preparing food.  Use hand sanitizer if soap and water are not available. Use an alcohol-based hand sanitizer with at least 60% alcohol, covering all surfaces of your hands and rubbing them together until they feel dry.  Soap and water are the best option, especially if your hands are visibly dirty.  Avoid touching your eyes, nose, and mouth with unwashed hands. Avoid sharing personal household items.  Do not share dishes, drinking glasses, cups, eating utensils, towels, or bedding with other people in your home.  Wash these items thoroughly after using them with soap and water or put them in the dishwasher. Clean all "high-touch" surfaces everyday.  Clean and disinfect high-touch surfaces in your "sick room" and bathroom. Let someone else clean and disinfect surfaces in common areas, but not your bedroom and bathroom.  If a caregiver or other person needs to clean and disinfect a sick person's bedroom or bathroom, they should do so on an as-needed basis. The caregiver/other person should wear a mask and wait as [place] as possible after the sick person has used the bathroom. High-touch surfaces include phones, remote controls, counters, tabletops, doorknobs, bathroom fixtures, toilets,  keyboards, tablets, and bedside tables.  Clean and disinfect areas that may have blood, stool, or body fluids on them.  Use household cleaners and disinfectants. Clean the area or item with soap and water or another detergent if it is dirty. Then use a household disinfectant. ? Be sure to follow the instructions on the label to ensure safe and effective use of the product. Many products recommend keeping the surface wet for several minutes to ensure germs are killed. Many also recommend precautions such as wearing gloves and making sure you have good ventilation during use of the product. ? Most EPA-registered household disinfectants should be effective. When you can be around others after you had or likely had COVID-19 When you can be around others (end home isolation) depends on different factors for different situations.  I think or know I had COVID-19, and I had symptoms ? You can be with others after  24 hours with no fever AND  Symptoms improved AND  10 days since symptoms first appeared ? Depending on your healthcare provider's advice and  availability of testing, you might get tested to see if you still have COVID-19. If you will be tested, you can be around others when you have no fever, symptoms have improved, and you receive two negative test results in a row, at least 24 hours apart.  I tested positive for COVID-19 but had no symptoms ? If you continue to have no symptoms, you can be with others after:  10 days have passed since test ? Depending on your healthcare provider's advice and availability of testing, you might get tested to see if you still have COVID-19. If you will be tested, you can be around others after you receive two negative test results in a row, at least 24 hours apart. ? If you develop symptoms after testing positive, follow the guidance above for "I think or know I had COVID, and I had symptoms." [email or web address] 07/05/2019 This information is not  intended to replace advice given to you by your health care provider. Make sure you discuss any questions you have with your health care provider. Document Revised: 07/21/2019 Document Reviewed: 05/24/2019 Elsevier Patient Education  2020 [place]. COVID-19 COVID-19 is a respiratory infection that is caused by a virus called severe acute respiratory syndrome coronavirus 2 (SARS-CoV-2). The disease is also known as coronavirus disease or novel coronavirus. In some people, the virus may not cause any symptoms. In others, it may cause a serious infection. The infection can get worse quickly and can lead to complications, such as:  Pneumonia, or infection of the lungs.  Acute respiratory distress syndrome or ARDS. This is a condition in which fluid build-up in the lungs prevents the lungs from filling with air and passing oxygen into the blood.  Acute respiratory failure. This is a condition in which there is not enough oxygen passing from the lungs to the body or when carbon dioxide is not passing from the lungs out of the body.  Sepsis or septic shock. This is a serious bodily reaction to an infection.  Blood clotting problems.  Secondary infections due to bacteria or fungus.  Organ failure. This is when your body's organs stop working. The virus that causes COVID-19 is contagious. This means that it can spread from person to person through droplets from coughs and sneezes (respiratory secretions). What are the causes? This illness is caused by a virus. You may catch the virus by:  Breathing in droplets from an infected person. Droplets can be spread by a person breathing, speaking, singing, coughing, or sneezing.  Touching something, like a table or a doorknob, that was exposed to the virus (contaminated) and then touching your mouth, nose, or eyes. What increases the risk? Risk for infection You are more likely to be infected with this virus if you:  Are within 6 feet (2 meters) of  a person with COVID-19.  Provide care for or live with a person who is infected with COVID-19.  Spend time in crowded indoor spaces or live in shared housing. Risk for serious illness You are more likely to become seriously ill from the virus if you:  Are 72 years of age or older. The higher your age, the more you are at risk for serious illness.  Live in a nursing home or [facility]-term care facility.  Have cancer.  Have a Mon[place]-term (chronic) disease such as: ? Chronic lung disease, including chronic obstructive pulmonary disease or asthma. ? A Rauda-term disease that lowers your body's ability to fight infection (immunocompromised). ? Heart disease, including heart  failure, a condition in which the arteries that lead to the heart become narrow or blocked (coronary artery disease), a disease which makes the heart muscle thick, weak, or stiff (cardiomyopathy). ? Diabetes. ? Chronic kidney disease. ? Sickle cell disease, a condition in which red blood cells have an abnormal "sickle" shape. ? Liver disease.  Are obese. What are the signs or symptoms? Symptoms of this condition can range from mild to severe. Symptoms may appear any time from 2 to 14 days after being exposed to the virus. They include:  A fever or chills.  A cough.  Difficulty breathing.  Headaches, body aches, or muscle aches.  Runny or stuffy (congested) nose.  A sore throat.  New loss of taste or smell. Some people may also have stomach problems, such as nausea, vomiting, or diarrhea. Other people may not have any symptoms of COVID-19. How is this diagnosed? This condition may be diagnosed based on:  Your signs and symptoms, especially if: ? You live in an area with a COVID-19 outbreak. ? You recently traveled to or from an area where the virus is common. ? You provide care for or live with a person who was diagnosed with COVID-19. ? You were exposed to a person who was diagnosed with COVID-19.  A physical  exam.  Lab tests, which may include: ? Taking a sample of fluid from the back of your nose and throat (nasopharyngeal fluid), your nose, or your throat using a swab. ? A sample of mucus from your lungs (sputum). ? Blood tests.  Imaging tests, which may include, X-rays, CT scan, or ultrasound. How is this treated? At present, there is no medicine to treat COVID-19. Medicines that treat other diseases are being used on a trial basis to see if they are effective against COVID-19. Your health care provider will talk with you about ways to treat your symptoms. For most people, the infection is mild and can be managed at home with rest, fluids, and over-the-counter medicines. Treatment for a serious infection usually takes places in a hospital intensive care unit (ICU). It may include one or more of the following treatments. These treatments are given until your symptoms improve.  Receiving fluids and medicines through an IV.  Supplemental oxygen. Extra oxygen is given through a tube in the nose, a face mask, or a hood.  Positioning you to lie on your stomach (prone position). This makes it easier for oxygen to get into the lungs.  Continuous positive airway pressure (CPAP) or bi-level positive airway pressure (BPAP) machine. This treatment uses mild air pressure to keep the airways open. A tube that is connected to a motor delivers oxygen to the body.  Ventilator. This treatment moves air into and out of the lungs by using a tube that is placed in your windpipe.  Tracheostomy. This is a procedure to create a hole in the neck so that a breathing tube can be inserted.  Extracorporeal membrane oxygenation (ECMO). This procedure gives the lungs a chance to recover by taking over the functions of the heart and lungs. It supplies oxygen to the body and removes carbon dioxide. Follow these instructions at home: Lifestyle  If you are sick, stay home except to get medical care. Your health care  provider will tell you how [place] to stay home. Call your health care provider before you go for medical care.  Rest at home as told by your health care provider.  Do not use any products that contain nicotine  or tobacco, such as cigarettes, e-cigarettes, and chewing tobacco. If you need help quitting, ask your health care provider.  Return to your normal activities as told by your health care provider. Ask your health care provider what activities are safe for you. General instructions  Take over-the-counter and prescription medicines only as told by your health care provider.  Drink enough fluid to keep your urine pale yellow.  Keep all follow-up visits as told by your health care provider. This is important. How is this prevented?  There is no vaccine to help prevent COVID-19 infection. However, there are steps you can take to protect yourself and others from this virus. To protect yourself:   Do not travel to areas where COVID-19 is a risk. The areas where COVID-19 is reported change often. To identify high-risk areas and travel restrictions, check the CDC travel website: FatFares.com.br  If you live in, or must travel to, an area where COVID-19 is a risk, take precautions to avoid infection. ? Stay away from people who are sick. ? Wash your hands often with soap and water for 20 seconds. If soap and water are not available, use an alcohol-based hand sanitizer. ? Avoid touching your mouth, face, eyes, or nose. ? Avoid going out in public, follow guidance from your state and local health authorities. ? If you must go out in public, wear a cloth face covering or face mask. Make sure your mask covers your nose and mouth. ? Avoid crowded indoor spaces. Stay at least 6 feet (2 meters) away from others. ? Disinfect objects and surfaces that are frequently touched every day. This may include:  Counters and tables.  Doorknobs and light switches.  Sinks and  faucets.  Electronics, such as phones, remote controls, keyboards, computers, and tablets. To protect others: If you have symptoms of COVID-19, take steps to prevent the virus from spreading to others.  If you think you have a COVID-19 infection, contact your health care provider right away. Tell your health care team that you think you may have a COVID-19 infection.  Stay home. Leave your house only to seek medical care. Do not use public transport.  Do not travel while you are sick.  Wash your hands often with soap and water for 20 seconds. If soap and water are not available, use alcohol-based hand sanitizer.  Stay away from other members of your household. Let healthy household members care for children and pets, if possible. If you have to care for children or pets, wash your hands often and wear a mask. If possible, stay in your own room, separate from others. Use a different bathroom.  Make sure that all people in your household wash their hands well and often.  Cough or sneeze into a tissue or your sleeve or elbow. Do not cough or sneeze into your hand or into the air.  Wear a cloth face covering or face mask. Make sure your mask covers your nose and mouth. Where to find more information  Centers for Disease Control and Prevention: PurpleGadgets.be  World Health Organization: https://www.castaneda.info/ Contact a health care provider if:  You live in or have traveled to an area where COVID-19 is a risk and you have symptoms of the infection.  You have had contact with someone who has COVID-19 and you have symptoms of the infection. Get help right away if:  You have trouble breathing.  You have pain or pressure in your chest.  You have confusion.  You have bluish lips  and fingernails.  You have difficulty waking from sleep.  You have symptoms that get worse. These symptoms may represent a serious problem that is an emergency. Do  not wait to see if the symptoms will go away. Get medical help right away. Call your local emergency services (911 in the [place] Do not drive yourself to the hospital. Let the emergency medical personnel know if you think you have COVID-19. Summary  COVID-19 is a respiratory infection that is caused by a virus. It is also known as coronavirus disease or novel coronavirus. It can cause serious infections, such as pneumonia, acute respiratory distress syndrome, acute respiratory failure, or sepsis.  The virus that causes COVID-19 is contagious. This means that it can spread from person to person through droplets from breathing, speaking, singing, coughing, or sneezing.  You are more likely to develop a serious illness if you are 74 years of age or older, have a weak immune system, live in a nursing home, or have chronic disease.  There is no medicine to treat COVID-19. Your health care provider will talk with you about ways to treat your symptoms.  Take steps to protect yourself and others from infection. Wash your hands often and disinfect objects and surfaces that are frequently touched every day. Stay away from people who are sick and wear a mask if you are sick. This information is not intended to replace advice given to you by your health care provider. Make sure you discuss any questions you have with your health care provider. Document Revised: 09/09/2019 Document Reviewed: 12/16/2018 Elsevier Patient Education  [place]. Chronic Kidney Disease, Adult Chronic kidney disease (CKD) happens when the kidneys are damaged over a [place] period of time. The kidneys are two organs that help with:  Getting rid of waste and extra fluid from the blood.  Making hormones that maintain the amount of fluid in your tissues and blood vessels.  Making sure that the body has the right amount of fluids and chemicals. Most of the time, CKD does not go away, but it can usually be controlled. Steps must  be taken to slow down the kidney damage or to stop it from getting worse. If this is not done, the kidneys may stop working. Follow these instructions at home: Medicines  Take over-the-counter and prescription medicines only as told by your doctor. You may need to change the amount of medicines you take.  Do not take any new medicines unless your doctor says it is okay. Many medicines can make your kidney damage worse.  Do not take any vitamin and supplements unless your doctor says it is okay. Many vitamins and supplements can make your kidney damage worse. General instructions  Follow a diet as told by your doctor. You may need to stay away from: ? Alcohol. ? Salty foods. ? Foods that are high in:  Potassium.  Calcium.  Protein.  Do not use any products that contain nicotine or tobacco, such as cigarettes and e-cigarettes. If you need help quitting, ask your doctor.  Keep track of your blood pressure at home. Tell your doctor about any changes.  If you have diabetes, keep track of your blood sugar as told by your doctor.  Try to stay at a healthy weight. If you need help, ask your doctor.  Exercise at least 30 minutes a day, 5 days a week.  Stay up-to-date with your shots (immunizations) as told by your doctor.  Keep all follow-up visits as told by  your doctor. This is important. Contact a doctor if:  Your symptoms get worse.  You have new symptoms. Get help right away if:  You have symptoms of end-stage kidney disease. These may include: ? Headaches. ? Numbness in your hands or feet. ? Easy bruising. ? Having hiccups often. ? Chest pain. ? Shortness of breath. ? Stopping of menstrual periods in women.  You have a fever.  You have very little pee (urine).  You have pain or bleeding when you pee. Summary  Chronic kidney disease (CKD) happens when the kidneys are damaged over a [place] period of time.  Most of the time, this condition does not go away, but it  can usually be controlled. Steps must be taken to slow down the kidney damage or to stop it from getting worse.  Treatment may include a combination of medicines and lifestyle changes. This information is not intended to replace advice given to you by your health care provider. Make sure you discuss any questions you have with your health care provider. Document Revised: 10/23/2017 Document Reviewed: 12/15/2016 Elsevier Patient Education  2020 [place]. Acute Kidney Injury, Adult  Acute kidney injury is a sudden worsening of kidney function. The kidneys are organs that have several jobs. They filter the blood to remove waste products and extra fluid. They also maintain a healthy balance of minerals and hormones in the body, which helps control blood pressure and keep bones strong. With this condition, your kidneys do not do their jobs as well as they should. This condition ranges from mild to severe. Over time it may develop into [place]-lasting (chronic) kidney disease. Early detection and treatment may prevent acute kidney injury from developing into a chronic condition. What are the causes? Common causes of this condition include:  A problem with blood flow to the kidneys. This may be caused by: ? Low blood pressure (hypotension) or shock. ? Blood loss. ? Heart and blood vessel (cardiovascular) disease. ? Severe burns. ? Liver disease.  Direct damage to the kidneys. This may be caused by: ? Certain medicines. ? A kidney infection. ? Poisoning. ? Being around or in contact with toxic substances. ? A surgical wound. ? A hard, direct hit to the kidney area.  A sudden blockage of urine flow. This may be caused by: ? Cancer. ? Kidney stones. ? An enlarged prostate in males. What are the signs or symptoms? Symptoms of this condition may not be obvious until the condition becomes severe. Symptoms of this condition can include:  Tiredness (lethargy), or difficulty staying  awake.  Nausea or vomiting.  Swelling (edema) of the face, legs, ankles, or feet.  Problems with urination, such as: ? Abdominal pain, or pain along the side of your stomach (flank). ? Decreased urine production. ? Decrease in the force of urine flow.  Muscle twitches and cramps, especially in the legs.  Confusion or trouble concentrating.  Loss of appetite.  Fever. How is this diagnosed? This condition may be diagnosed with tests, including:  Blood tests.  Urine tests.  Imaging tests.  A test in which a sample of tissue is removed from the kidneys to be examined under a microscope (kidney biopsy). How is this treated? Treatment for this condition depends on the cause and how severe the condition is. In mild cases, treatment may not be needed. The kidneys may heal on their own. In more severe cases, treatment will involve:  Treating the cause of the kidney injury. This may involve changing any  medicines you are taking or adjusting your dosage.  Fluids. You may need specialized IV fluids to balance your body's needs.  Having a catheter placed to drain urine and prevent blockages.  Preventing problems from occurring. This may mean avoiding certain medicines or procedures that can cause further injury to the kidneys. In some cases treatment may also require:  A procedure to remove toxic wastes from the body (dialysis or continuous renal replacement therapy - CRRT).  Surgery. This may be done to repair a torn kidney, or to remove the blockage from the urinary system. Follow these instructions at home: Medicines  Take over-the-counter and prescription medicines only as told by your health care provider.  Do not take any new medicines without your health care provider's approval. Many medicines can worsen your kidney damage.  Do not take any vitamin and mineral supplements without your health care provider's approval. Many nutritional supplements can worsen your kidney  damage. Lifestyle  If your health care provider prescribed changes to your diet, follow them. You may need to decrease the amount of protein you eat.  Achieve and maintain a healthy weight. If you need help with this, ask your health care provider.  Start or continue an exercise plan. Try to exercise at least 30 minutes a day, 5 days a week.  Do not use any tobacco products, such as cigarettes, chewing tobacco, and e-cigarettes. If you need help quitting, ask your health care provider. General instructions  Keep track of your blood pressure. Report changes in your blood pressure as told by your health care provider.  Stay up to date with immunizations. Ask your health care provider which immunizations you need.  Keep all follow-up visits as told by your health care provider. This is important. Where to find more information  American Association of Kidney Patients: BombTimer.gl  National Kidney Foundation: www.kidney.[place]: https://mathis.com/  Life Options Rehabilitation Program: ? www.lifeoptions.org ? www.kidneyschool.org Contact a health care provider if:  Your symptoms get worse.  You develop new symptoms. Get help right away if:  You develop symptoms of worsening kidney disease, which include: ? Headaches. ? Abnormally dark or light skin. ? Easy bruising. ? Frequent hiccups. ? Chest pain. ? Shortness of breath. ? End of menstruation in women. ? Seizures. ? Confusion or altered mental status. ? Abdominal or back pain. ? Itchiness.  You have a fever.  Your body is producing less urine.  You have pain or bleeding when you urinate. Summary  Acute kidney injury is a sudden worsening of kidney function.  Acute kidney injury can be caused by problems with blood flow to the kidneys, direct damage to the kidneys, and sudden blockage of urine flow.  Symptoms of this condition may not be obvious until it becomes severe. Symptoms may include edema,  lethargy, confusion, nausea or vomiting, and problems passing urine.  This condition can usually be diagnosed with blood tests, urine tests, and imaging tests. Sometimes a kidney biopsy is done to diagnose this condition.  Treatment for this condition often involves treating the underlying cause. It is treated with fluids, medicines, dialysis, diet changes, or surgery. This information is not intended to replace advice given to you by your health care provider. Make sure you discuss any questions you have with your health care provider. Document Revised: 10/23/2017 Document Reviewed: 10/31/2016 Elsevier Patient Education  2020 [employer].

## 2019-12-06 NOTE — TOC Transition Note (Signed)
Transition of Care [facility]) - CM/SW Discharge Note   Patient Details  Name: [name] MRN: [id number] Date of Birth: 1935/01/17  [facility]) CM/SW Contact:  Bartholomew Crews, RN Phone Number: [phone] 12/06/2019, 4:48 PM   Clinical Narrative:    Notified by nursing that patient was to transition home today with [facility] PT and OT. Referral placed to [facility] accepted. No further transition of care needs identifed.    Final next level of care: [place] Barriers to Discharge: No Barriers Identified   Patient Goals and CMS Choice        Discharge Placement                       Discharge Plan and Services                          HH Arranged: PT, OT [place] Agency: [facility] (formerly [facility]) Date [place]: 12/06/19 Time [place]: 1615 Representative spoke with at [place]: [employer] Interventions     Readmission Risk Interventions No flowsheet data found.

## 2019-12-06 NOTE — Evaluation (Signed)
Physical Therapy Evaluation Patient Details Name: [name] MRN: [id number] DOB: February 17, 1935 Today's Date: 12/06/2019   History of Present Illness  85 y.o. female with medical history significant of CKD, Glaucoma, Gout, HTN, Hyperthyroidism s/p ablation, who presents with weakness and fatigue with reported fever to 104 F. COVID+    Clinical Impression  PT eval complete. PTA pt active and independent. On eval, she required min guard assist for bed mobility, transfers and gait. Ambulated 103' without AD on RA with SpO2 > 90%. Pt lives with family. She has RW and SPC at home, if needed. Plan is for d/c home today. PT signing off. All further needs to be addressed by HHPT.    Follow Up Recommendations Home health PT;Supervision for mobility/OOB    Equipment Recommendations  None recommended by PT    Recommendations for Other Services       Precautions / Restrictions Precautions Precautions: Fall      Mobility  Bed Mobility Overal bed mobility: Needs Assistance Bed Mobility: Supine to Sit;Sit to Supine     Supine to sit: HOB elevated;Min guard Sit to supine: HOB elevated;Min guard   General bed mobility comments: +rail, min guard for safety, no physical assist  Transfers Overall transfer level: Needs assistance Equipment used: None Transfers: Sit to/from [employer] Transfers Sit to Stand: Min guard Stand pivot transfers: Min guard       General transfer comment: min guard for safety  Ambulation/Gait Ambulation/Gait assistance: Min guard Gait Distance (Feet): 60 Feet Assistive device: None Gait Pattern/deviations: Step-through pattern;Decreased stride length Gait velocity: decreased   General Gait Details: mildly unsteady. Pt has RW and SPC at home, if needed. Mobilized on RA with SpO2 >90%.  Stairs            Wheelchair Mobility    Modified Rankin (Stroke Patients Only)       Balance Overall balance assessment: Mild deficits observed, not  formally tested                                           Pertinent Vitals/Pain Pain Assessment: No/denies pain    Home Living Family/patient expects to be discharged to:: Private residence Living Arrangements: Children Available Help at Discharge: Family;Available 24 hours/day Type of Home: House Home Access: Stairs to enter Entrance Stairs-Rails: Can reach both;Left;Right Entrance Stairs-Number of Steps: 7 Home Layout: Two level Home Equipment: [place] - 2 wheels;Cane - single point      Prior Function Level of Independence: Independent               Hand Dominance        Extremity/Trunk Assessment   Upper Extremity Assessment Upper Extremity Assessment: Generalized weakness    Lower Extremity Assessment Lower Extremity Assessment: Generalized weakness       Communication   Communication: No difficulties  Cognition Arousal/Alertness: Awake/alert Behavior During Therapy: WFL for tasks assessed/performed Overall Cognitive Status: Within Functional Limits for tasks assessed                                        General Comments      Exercises     Assessment/Plan    PT Assessment All further PT needs can be met in the next venue of care  PT Problem List Decreased  strength;Decreased mobility;Cardiopulmonary status limiting activity;Decreased activity tolerance;Decreased balance       PT Treatment Interventions      PT Goals (Current goals can be found in the Care Plan section)  Acute Rehab PT Goals Patient Stated Goal: get stronger PT Goal Formulation: All assessment and education complete, DC therapy    Frequency     Barriers to discharge        Co-evaluation               AM-PAC PT "6 Clicks" Mobility  Outcome Measure Help needed turning from your back to your side while in a flat bed without using bedrails?: None Help needed moving from lying on your back to sitting on the side of a flat bed  without using bedrails?: A Little Help needed moving to and from a bed to a chair (including a wheelchair)?: A Little Help needed standing up from a chair using your arms (e.g., wheelchair or bedside chair)?: A Little Help needed to walk in hospital room?: A Little Help needed climbing 3-5 steps with a railing? : A Little 6 Click Score: 19    End of Session   Activity Tolerance: Patient tolerated treatment well Patient left: in bed;with call bell/phone within reach Nurse Communication: Mobility status PT Visit Diagnosis: Unsteadiness on feet (R26.81);Muscle weakness (generalized) (M62.81)    Time: 7949-9718 PT Time Calculation (min) (ACUTE ONLY): 16 min   Charges:   PT Evaluation $PT Eval Moderate Complexity: 1 Mod          Lorrin Goodell, PT  Office # [phone] Pager [phone]   Lorriane Shire 12/06/2019, 11:04 AM

## 2019-12-07 ENCOUNTER — Telehealth: Payer: Self-pay | Admitting: *Deleted

## 2019-12-07 NOTE — Telephone Encounter (Signed)
Transition Care Management Follow-up Telephone Call   Date discharged? 12/06/19   How have you been since you were released from the hospital? "pretty good"   Do you understand why you were in the hospital? yes   Do you understand the discharge instructions? yes   Where were you discharged to? home   Items Reviewed:  Medications reviewed: pt states she is laying down and will look at her list later. Says she will call if she has any questions.  Allergies reviewed: yes  Dietary changes reviewed: yes  Referrals reviewed: yes   Functional Questionnaire:   Activities of Daily Living (ADLs):   She states they are independent in the following: ambulation, bathing and hygiene, feeding, continence, grooming, toileting and dressing States they require assistance with the following: na   Any transportation issues/concerns?: no   Any patient concerns? no   Confirmed importance and date/time of follow-up visits scheduled yes  Provider Appointment booked with PCP 12/12/19.  Confirmed with patient if condition begins to worsen call PCP or go to the ER.  Patient was given the office number and encouraged to call back with question or concerns.  : yes

## 2019-12-08 DIAGNOSIS — I129 Hypertensive chronic kidney disease with stage 1 through stage 4 chronic kidney disease, or unspecified chronic kidney disease: Secondary | ICD-10-CM | POA: Diagnosis not present

## 2019-12-08 DIAGNOSIS — J1282 Pneumonia due to coronavirus disease 2019: Secondary | ICD-10-CM | POA: Diagnosis not present

## 2019-12-08 DIAGNOSIS — M109 Gout, unspecified: Secondary | ICD-10-CM | POA: Diagnosis not present

## 2019-12-08 DIAGNOSIS — D631 Anemia in chronic kidney disease: Secondary | ICD-10-CM | POA: Diagnosis not present

## 2019-12-08 DIAGNOSIS — J22 Unspecified acute lower respiratory infection: Secondary | ICD-10-CM | POA: Diagnosis not present

## 2019-12-08 DIAGNOSIS — U071 COVID-19: Secondary | ICD-10-CM | POA: Diagnosis not present

## 2019-12-08 DIAGNOSIS — N1831 Chronic kidney disease, stage 3a: Secondary | ICD-10-CM | POA: Diagnosis not present

## 2019-12-08 DIAGNOSIS — H409 Unspecified glaucoma: Secondary | ICD-10-CM | POA: Diagnosis not present

## 2019-12-08 DIAGNOSIS — E039 Hypothyroidism, unspecified: Secondary | ICD-10-CM | POA: Diagnosis not present

## 2019-12-09 ENCOUNTER — Telehealth: Payer: Self-pay | Admitting: Internal Medicine

## 2019-12-09 NOTE — Telephone Encounter (Signed)
Mark PT with [facility] is calling and needs PT order 1x 1 , 2x6, 1x2 starting on 12/08/2019. Ok to leave verbal on voicemail due to covid

## 2019-12-12 ENCOUNTER — Encounter: Payer: Self-pay | Admitting: Internal Medicine

## 2019-12-12 ENCOUNTER — Ambulatory Visit (INDEPENDENT_AMBULATORY_CARE_PROVIDER_SITE_OTHER): Payer: Medicare HMO | Admitting: Internal Medicine

## 2019-12-12 ENCOUNTER — Other Ambulatory Visit: Payer: Self-pay

## 2019-12-12 VITALS — BP 139/72 | HR 84

## 2019-12-12 DIAGNOSIS — U071 COVID-19: Secondary | ICD-10-CM | POA: Diagnosis not present

## 2019-12-12 DIAGNOSIS — J22 Unspecified acute lower respiratory infection: Secondary | ICD-10-CM | POA: Diagnosis not present

## 2019-12-12 DIAGNOSIS — J1282 Pneumonia due to coronavirus disease 2019: Secondary | ICD-10-CM | POA: Diagnosis not present

## 2019-12-12 DIAGNOSIS — M109 Gout, unspecified: Secondary | ICD-10-CM | POA: Diagnosis not present

## 2019-12-12 DIAGNOSIS — H409 Unspecified glaucoma: Secondary | ICD-10-CM | POA: Diagnosis not present

## 2019-12-12 DIAGNOSIS — E039 Hypothyroidism, unspecified: Secondary | ICD-10-CM | POA: Diagnosis not present

## 2019-12-12 DIAGNOSIS — N1831 Chronic kidney disease, stage 3a: Secondary | ICD-10-CM | POA: Diagnosis not present

## 2019-12-12 DIAGNOSIS — I129 Hypertensive chronic kidney disease with stage 1 through stage 4 chronic kidney disease, or unspecified chronic kidney disease: Secondary | ICD-10-CM | POA: Diagnosis not present

## 2019-12-12 DIAGNOSIS — D631 Anemia in chronic kidney disease: Secondary | ICD-10-CM | POA: Diagnosis not present

## 2019-12-12 NOTE — Progress Notes (Signed)
Subjective:    Patient ID: [name], female    DOB: 1934/12/07, 85 y.o.   MRN: [id number]  DOS:  12/12/2019 Type of visit - description: Virtual Visit via Telephone  Attempted  to make this a video visit, due to technical difficulties from the patient side it was not possible  thus we proceeded with a Virtual Visit via Telephone    I connected with above mentioned patient  by telephone and verified that I am speaking with the correct person using two identifiers.  THIS ENCOUNTER IS A VIRTUAL VISIT DUE TO COVID-19 - PATIENT WAS NOT SEEN IN THE OFFICE. PATIENT HAS CONSENTED TO VIRTUAL VISIT / TELEMEDICINE VISIT   Location of patient: home  Location of provider: office  I discussed the limitations, risks, security and privacy concerns of performing an evaluation and management service by telephone and the availability of in person appointments. I also discussed with the patient that there may be a patient responsible charge related to this service. The patient expressed understanding and agreed to proceed.  TCM Since the last office visit, she was admitted to the hospital and discharged 12/06/2019. Admitted with fever, fatigue, weakness.  Prior to admission a Covid test was positive. She was diagnosed with COVID-19 with suspected pneumonitis. S/p steroids. O2 sat prior to discharge 100% on room air.  Kidney function remained decreased.  Was discharged home and recommended home health physical therapy.  Labs: Last potassium 4.7, last creatinine 1.5. CBC show a slight increase WBCs and platelets. Chest x-ray upon admission mild patchy atelectasis/consolidation or scarring at the base of the lungs   Review of Systems  Since she left the hospital she is feeling progressively better. No fever chills No nausea or vomiting.  She did have mild diarrhea. Fatigue, difficulty breathing, cough: All improving Appetite is coming back No chest pain   Past Medical History:  Diagnosis  Date  . Abnormal MRI of abdomen    ABNORMAL CT and MRI of pancreas, last MRI 1-09: after d/w GI we rec. to f/u clinically and redo XRs if problems    . Anemia   . CRI (chronic renal insufficiency)    elevated creatinine 4-11: stage 3 CKD, sees nephrology, history of solitary   . Diverticulosis   . Glaucoma   . Gout    h/o   . Hypertension   . Hyperthyroidism    s/p ablation, Dr Debbora Presto    Past Surgical History:  Procedure Laterality Date  . CATARACT EXTRACTION W/ INTRAOCULAR LENS  IMPLANT, BILATERAL  2006  . COLONOSCOPY    . ORIF TIBIA PLATEAU  10/24/2012   Procedure: OPEN REDUCTION INTERNAL FIXATION (ORIF) TIBIAL PLATEAU;  Surgeon: Jessy Oto, MD;  Location: [facility];  Service: Orthopedics;  Laterality: Left;            Objective:   Physical Exam There were no vitals taken for this visit. This is a virtual telephone visit.  She is alert oriented x3, in no distress, speaking in complete sentences, no cough noted.    Assessment       Assessment HTN CKD, stage III, Single kidney (absent L one); sees nephrology prn, last visit 2014. (Creatinine 1.8 = clearance ~ 30) Hypothyroidism:  s/p ablation, Dr. Chalmers Cater, no f/u schedule  Glaucoma H/o Gout H/o abnormal MRI abdomen , last MRI 2009, saw GI ---> follow up clinically, redo MRI prn  PLAN:  COVID-19, pneumonitis: Status post admission to the hospital, doing better, symptoms are gradually decreasing.  Advise patient that I think she will continue to recover gradually. She will stop being contagious in 5 days Okay to proceed with a Covid vaccination 1 month from today Will come back for blood work in 3 weeks: CMP, CBC, TSH Hypothyroidism: Labs Plan: RTC for blood work 3 weeks Follow-up with me CPX 3 to 4 months, will arrange.     I discussed the assessment and treatment plan with the patient. The patient was provided an opportunity to ask questions and all were answered. The patient agreed with the plan and demonstrated  an understanding of the instructions.   The patient was advised to call back or seek an in-person evaluation if the symptoms worsen or if the condition fails to improve as anticipated.  I provided 25 minutes of non-face-to-face time during this encounter.  Kathlene November, MD

## 2019-12-12 NOTE — Assessment & Plan Note (Signed)
COVID-19, pneumonitis: Status post admission to the hospital, doing better, symptoms are gradually decreasing. Advise patient that I think she will continue to recover gradually. She will stop being contagious in 5 days Okay to proceed with a Covid vaccination 1 month from today Will come back for blood work in 3 weeks: CMP, CBC, TSH Hypothyroidism: Labs Plan: RTC for blood work 3 weeks Follow-up with me CPX 3 to 4 months, will arrange.

## 2019-12-12 NOTE — Telephone Encounter (Signed)
Spoke w/ Elta Gu[place]- verbal orders given.

## 2019-12-14 DIAGNOSIS — J22 Unspecified acute lower respiratory infection: Secondary | ICD-10-CM | POA: Diagnosis not present

## 2019-12-14 DIAGNOSIS — N1831 Chronic kidney disease, stage 3a: Secondary | ICD-10-CM | POA: Diagnosis not present

## 2019-12-14 DIAGNOSIS — M109 Gout, unspecified: Secondary | ICD-10-CM | POA: Diagnosis not present

## 2019-12-14 DIAGNOSIS — U071 COVID-19: Secondary | ICD-10-CM | POA: Diagnosis not present

## 2019-12-14 DIAGNOSIS — D631 Anemia in chronic kidney disease: Secondary | ICD-10-CM | POA: Diagnosis not present

## 2019-12-14 DIAGNOSIS — H409 Unspecified glaucoma: Secondary | ICD-10-CM | POA: Diagnosis not present

## 2019-12-14 DIAGNOSIS — I129 Hypertensive chronic kidney disease with stage 1 through stage 4 chronic kidney disease, or unspecified chronic kidney disease: Secondary | ICD-10-CM | POA: Diagnosis not present

## 2019-12-14 DIAGNOSIS — J1282 Pneumonia due to coronavirus disease 2019: Secondary | ICD-10-CM | POA: Diagnosis not present

## 2019-12-14 DIAGNOSIS — E039 Hypothyroidism, unspecified: Secondary | ICD-10-CM | POA: Diagnosis not present

## 2019-12-15 ENCOUNTER — Telehealth: Payer: Self-pay

## 2019-12-15 NOTE — Telephone Encounter (Signed)
Plan of care signed and faxed to [facility] at [phone]. Form sent for scanning. Received fax confirmation.

## 2019-12-20 DIAGNOSIS — E039 Hypothyroidism, unspecified: Secondary | ICD-10-CM | POA: Diagnosis not present

## 2019-12-20 DIAGNOSIS — N1831 Chronic kidney disease, stage 3a: Secondary | ICD-10-CM | POA: Diagnosis not present

## 2019-12-20 DIAGNOSIS — I129 Hypertensive chronic kidney disease with stage 1 through stage 4 chronic kidney disease, or unspecified chronic kidney disease: Secondary | ICD-10-CM | POA: Diagnosis not present

## 2019-12-20 DIAGNOSIS — M109 Gout, unspecified: Secondary | ICD-10-CM | POA: Diagnosis not present

## 2019-12-20 DIAGNOSIS — J1282 Pneumonia due to coronavirus disease 2019: Secondary | ICD-10-CM | POA: Diagnosis not present

## 2019-12-20 DIAGNOSIS — D631 Anemia in chronic kidney disease: Secondary | ICD-10-CM | POA: Diagnosis not present

## 2019-12-20 DIAGNOSIS — U071 COVID-19: Secondary | ICD-10-CM | POA: Diagnosis not present

## 2019-12-20 DIAGNOSIS — H409 Unspecified glaucoma: Secondary | ICD-10-CM | POA: Diagnosis not present

## 2019-12-20 DIAGNOSIS — J22 Unspecified acute lower respiratory infection: Secondary | ICD-10-CM | POA: Diagnosis not present

## 2019-12-27 DIAGNOSIS — M109 Gout, unspecified: Secondary | ICD-10-CM | POA: Diagnosis not present

## 2019-12-27 DIAGNOSIS — I129 Hypertensive chronic kidney disease with stage 1 through stage 4 chronic kidney disease, or unspecified chronic kidney disease: Secondary | ICD-10-CM | POA: Diagnosis not present

## 2019-12-27 DIAGNOSIS — U071 COVID-19: Secondary | ICD-10-CM | POA: Diagnosis not present

## 2019-12-27 DIAGNOSIS — N1831 Chronic kidney disease, stage 3a: Secondary | ICD-10-CM | POA: Diagnosis not present

## 2019-12-27 DIAGNOSIS — J1282 Pneumonia due to coronavirus disease 2019: Secondary | ICD-10-CM | POA: Diagnosis not present

## 2019-12-27 DIAGNOSIS — J22 Unspecified acute lower respiratory infection: Secondary | ICD-10-CM | POA: Diagnosis not present

## 2019-12-27 DIAGNOSIS — E039 Hypothyroidism, unspecified: Secondary | ICD-10-CM | POA: Diagnosis not present

## 2019-12-27 DIAGNOSIS — H409 Unspecified glaucoma: Secondary | ICD-10-CM | POA: Diagnosis not present

## 2019-12-27 DIAGNOSIS — D631 Anemia in chronic kidney disease: Secondary | ICD-10-CM | POA: Diagnosis not present

## 2019-12-30 ENCOUNTER — Other Ambulatory Visit: Payer: Self-pay

## 2019-12-30 ENCOUNTER — Other Ambulatory Visit (INDEPENDENT_AMBULATORY_CARE_PROVIDER_SITE_OTHER): Payer: Medicare HMO

## 2019-12-30 DIAGNOSIS — E039 Hypothyroidism, unspecified: Secondary | ICD-10-CM

## 2019-12-30 DIAGNOSIS — J1282 Pneumonia due to coronavirus disease 2019: Secondary | ICD-10-CM

## 2019-12-30 DIAGNOSIS — Z8616 Personal history of COVID-19: Secondary | ICD-10-CM | POA: Diagnosis not present

## 2019-12-30 LAB — COMPREHENSIVE METABOLIC PANEL
ALT: 12 U/L (ref 0–35)
AST: 16 U/L (ref 0–37)
Albumin: 3.4 g/dL — ABNORMAL LOW (ref 3.5–5.2)
Alkaline Phosphatase: 89 U/L (ref 39–117)
BUN: 18 mg/dL (ref 6–23)
CO2: 23 mEq/L (ref 19–32)
Calcium: 9 mg/dL (ref 8.4–10.5)
Chloride: 106 mEq/L (ref 96–112)
Creatinine, Ser: 1.73 mg/dL — ABNORMAL HIGH (ref 0.40–1.20)
GFR: 34.06 mL/min — ABNORMAL LOW (ref >60.00)
Glucose, Bld: 84 mg/dL (ref 70–99)
Potassium: 4.6 mEq/L (ref 3.5–5.1)
Sodium: 139 mEq/L (ref 135–145)
Total Bilirubin: 0.4 mg/dL (ref 0.2–1.2)
Total Protein: 6.7 g/dL (ref 6.0–8.3)

## 2019-12-30 LAB — TSH: TSH: 0.44 u[IU]/mL (ref 0.35–4.50)

## 2019-12-30 LAB — CBC WITH DIFFERENTIAL/PLATELET
Basophils Absolute: 0.4 10*3/uL — ABNORMAL HIGH (ref 0.0–0.1)
Basophils Relative: 6.2 % — ABNORMAL HIGH (ref 0.0–3.0)
Eosinophils Absolute: 0.1 10*3/uL (ref 0.0–0.7)
Eosinophils Relative: 2 % (ref 0.0–5.0)
HCT: 28.9 % — ABNORMAL LOW (ref 36.0–46.0)
Hemoglobin: 9.6 g/dL — ABNORMAL LOW (ref 12.0–15.0)
Lymphocytes Relative: 27 % (ref 12.0–46.0)
Lymphs Abs: 1.7 10*3/uL (ref 0.7–4.0)
MCHC: 33.2 g/dL (ref 30.0–36.0)
MCV: 90.6 fl (ref 78.0–100.0)
Monocytes Absolute: 0.6 10*3/uL (ref 0.1–1.0)
Monocytes Relative: 9.6 % (ref 3.0–12.0)
Neutro Abs: 3.4 10*3/uL (ref 1.4–7.7)
Neutrophils Relative %: 55.2 % (ref 43.0–77.0)
Platelets: 370 10*3/uL (ref 150.0–400.0)
RBC: 3.19 Mil/uL — ABNORMAL LOW (ref 3.87–5.11)
RDW: 18.9 % — ABNORMAL HIGH (ref 11.5–15.5)
WBC: 6.2 10*3/uL (ref 4.0–10.5)

## 2020-01-02 ENCOUNTER — Telehealth: Payer: Self-pay | Admitting: Internal Medicine

## 2020-01-02 DIAGNOSIS — D649 Anemia, unspecified: Secondary | ICD-10-CM

## 2020-01-02 NOTE — Telephone Encounter (Signed)
Spoke w/ Pt- informed of results and recommendation. Lab appt scheduled 01/20/20. She wanted to know if she should start back on losartan (hosp d/c while admitted)- recommended she take bp for several days and call us w/ readings for PCP to make better determination. Pt verbalized understanding.

## 2020-01-02 NOTE — Telephone Encounter (Signed)
Advise patient, labs okay except for anemia. Please arrange for: Iron, ferritin, TIBC, reticulocyte count.  DX anemia

## 2020-01-04 DIAGNOSIS — E039 Hypothyroidism, unspecified: Secondary | ICD-10-CM | POA: Diagnosis not present

## 2020-01-04 DIAGNOSIS — J22 Unspecified acute lower respiratory infection: Secondary | ICD-10-CM | POA: Diagnosis not present

## 2020-01-04 DIAGNOSIS — H409 Unspecified glaucoma: Secondary | ICD-10-CM | POA: Diagnosis not present

## 2020-01-04 DIAGNOSIS — M109 Gout, unspecified: Secondary | ICD-10-CM | POA: Diagnosis not present

## 2020-01-04 DIAGNOSIS — N1831 Chronic kidney disease, stage 3a: Secondary | ICD-10-CM | POA: Diagnosis not present

## 2020-01-04 DIAGNOSIS — U071 COVID-19: Secondary | ICD-10-CM | POA: Diagnosis not present

## 2020-01-04 DIAGNOSIS — D631 Anemia in chronic kidney disease: Secondary | ICD-10-CM | POA: Diagnosis not present

## 2020-01-04 DIAGNOSIS — I129 Hypertensive chronic kidney disease with stage 1 through stage 4 chronic kidney disease, or unspecified chronic kidney disease: Secondary | ICD-10-CM | POA: Diagnosis not present

## 2020-01-04 DIAGNOSIS — J1282 Pneumonia due to coronavirus disease 2019: Secondary | ICD-10-CM | POA: Diagnosis not present

## 2020-01-07 DIAGNOSIS — H409 Unspecified glaucoma: Secondary | ICD-10-CM | POA: Diagnosis not present

## 2020-01-07 DIAGNOSIS — I129 Hypertensive chronic kidney disease with stage 1 through stage 4 chronic kidney disease, or unspecified chronic kidney disease: Secondary | ICD-10-CM | POA: Diagnosis not present

## 2020-01-07 DIAGNOSIS — M109 Gout, unspecified: Secondary | ICD-10-CM | POA: Diagnosis not present

## 2020-01-07 DIAGNOSIS — U071 COVID-19: Secondary | ICD-10-CM | POA: Diagnosis not present

## 2020-01-07 DIAGNOSIS — J22 Unspecified acute lower respiratory infection: Secondary | ICD-10-CM | POA: Diagnosis not present

## 2020-01-07 DIAGNOSIS — J1282 Pneumonia due to coronavirus disease 2019: Secondary | ICD-10-CM | POA: Diagnosis not present

## 2020-01-07 DIAGNOSIS — E039 Hypothyroidism, unspecified: Secondary | ICD-10-CM | POA: Diagnosis not present

## 2020-01-07 DIAGNOSIS — N1831 Chronic kidney disease, stage 3a: Secondary | ICD-10-CM | POA: Diagnosis not present

## 2020-01-07 DIAGNOSIS — D631 Anemia in chronic kidney disease: Secondary | ICD-10-CM | POA: Diagnosis not present

## 2020-01-09 DIAGNOSIS — J1282 Pneumonia due to coronavirus disease 2019: Secondary | ICD-10-CM | POA: Diagnosis not present

## 2020-01-09 DIAGNOSIS — N1831 Chronic kidney disease, stage 3a: Secondary | ICD-10-CM | POA: Diagnosis not present

## 2020-01-09 DIAGNOSIS — M109 Gout, unspecified: Secondary | ICD-10-CM | POA: Diagnosis not present

## 2020-01-09 DIAGNOSIS — J22 Unspecified acute lower respiratory infection: Secondary | ICD-10-CM | POA: Diagnosis not present

## 2020-01-09 DIAGNOSIS — E039 Hypothyroidism, unspecified: Secondary | ICD-10-CM | POA: Diagnosis not present

## 2020-01-09 DIAGNOSIS — I129 Hypertensive chronic kidney disease with stage 1 through stage 4 chronic kidney disease, or unspecified chronic kidney disease: Secondary | ICD-10-CM | POA: Diagnosis not present

## 2020-01-09 DIAGNOSIS — U071 COVID-19: Secondary | ICD-10-CM | POA: Diagnosis not present

## 2020-01-09 DIAGNOSIS — D631 Anemia in chronic kidney disease: Secondary | ICD-10-CM | POA: Diagnosis not present

## 2020-01-09 DIAGNOSIS — H409 Unspecified glaucoma: Secondary | ICD-10-CM | POA: Diagnosis not present

## 2020-01-16 ENCOUNTER — Other Ambulatory Visit: Payer: Self-pay

## 2020-01-16 ENCOUNTER — Telehealth: Payer: Self-pay | Admitting: Internal Medicine

## 2020-01-16 NOTE — Telephone Encounter (Signed)
Pt has appt tomorrow w/ PCP--discuss at that time.

## 2020-01-16 NOTE — Telephone Encounter (Signed)
Patient released from Hospital last month . Patient is having right leg swelling,per patient the hospitalist added medications to her list, patient wonders either she should discontinue medication due to leg swelling.  Added: zinc sulfate 220 (50 Zn) MG capsule [id number]   cholecalciferol (VITAMIN D) 25 MCG tablet [id number]    ascorbic acid (VITAMIN C) 500 MG tablet [id number]   Please advise

## 2020-01-17 ENCOUNTER — Ambulatory Visit: Payer: Medicare HMO | Admitting: Internal Medicine

## 2020-01-18 ENCOUNTER — Ambulatory Visit (HOSPITAL_BASED_OUTPATIENT_CLINIC_OR_DEPARTMENT_OTHER)
Admission: RE | Admit: 2020-01-18 | Discharge: 2020-01-18 | Disposition: A | Discharge status: Home / Self Care | Payer: Medicare HMO | Source: Ambulatory Visit | Attending: Internal Medicine | Admitting: Internal Medicine

## 2020-01-18 ENCOUNTER — Ambulatory Visit (INDEPENDENT_AMBULATORY_CARE_PROVIDER_SITE_OTHER): Payer: Medicare HMO | Admitting: Internal Medicine

## 2020-01-18 ENCOUNTER — Encounter: Payer: Self-pay | Admitting: Internal Medicine

## 2020-01-18 ENCOUNTER — Other Ambulatory Visit: Payer: Self-pay

## 2020-01-18 VITALS — BP 155/62 | HR 80 | Temp 97.0°F | Resp 18 | Ht 65.0 in | Wt 132.5 lb

## 2020-01-18 DIAGNOSIS — J1282 Pneumonia due to coronavirus disease 2019: Secondary | ICD-10-CM | POA: Insufficient documentation

## 2020-01-18 DIAGNOSIS — M7989 Other specified soft tissue disorders: Secondary | ICD-10-CM

## 2020-01-18 DIAGNOSIS — U071 COVID-19: Secondary | ICD-10-CM | POA: Diagnosis not present

## 2020-01-18 DIAGNOSIS — D649 Anemia, unspecified: Secondary | ICD-10-CM

## 2020-01-18 MED ORDER — DOXYCYCLINE HYCLATE 100 MG PO TABS: 100.0000 mg | ORAL_TABLET | Freq: Two times a day (BID) | ORAL | 0 refills | Status: DC

## 2020-01-18 NOTE — Progress Notes (Addendum)
Subjective:    Patient ID: [name], female    DOB: 04/05/1935, 85 y.o.   MRN: [id number]  DOS:  01/18/2020 Type of visit - description: Acute 6 days ago developed swelling at the right leg. Denies actual pain, redness, warmness.  We also discussed anemia and recent Covid pneumonia.  BP Readings from Last 3 Encounters:  01/18/20 (!) 155/62  12/12/19 139/72  12/06/19 (!) 154/68     Review of Systems Currently reports no fever chills No chest pain no difficulty breathing No nausea, vomiting, diarrhea or blood in the stools. No cough  Past Medical History:  Diagnosis Date  . Abnormal MRI of abdomen    ABNORMAL CT and MRI of pancreas, last MRI 1-09: after d/w GI we rec. to f/u clinically and redo XRs if problems    . Anemia   . CRI (chronic renal insufficiency)    elevated creatinine 4-11: stage 3 CKD, sees nephrology, history of solitary   . Diverticulosis   . Glaucoma   . Gout    h/o   . Hypertension   . Hyperthyroidism    s/p ablation, Dr Debbora Presto    Past Surgical History:  Procedure Laterality Date  . CATARACT EXTRACTION W/ INTRAOCULAR LENS  IMPLANT, BILATERAL  2006  . COLONOSCOPY    . ORIF TIBIA PLATEAU  10/24/2012   Procedure: OPEN REDUCTION INTERNAL FIXATION (ORIF) TIBIAL PLATEAU;  Surgeon: Jessy Oto, MD;  Location: [facility];  Service: Orthopedics;  Laterality: Left;    Allergies as of 01/18/2020   No Known Allergies     Medication List       Accurate as of January 18, 2020  2:43 PM. If you have any questions, ask your nurse or doctor.        acetaminophen 500 MG tablet Commonly known as: TYLENOL Take 500-1,000 mg by mouth every 6 (six) hours as needed for fever or headache (pain).   amLODipine 5 MG tablet Commonly known as: NORVASC Take 1 tablet (5 mg total) by mouth daily.   ascorbic acid 500 MG tablet Commonly known as: VITAMIN C Take 1 tablet (500 mg total) by mouth daily.   aspirin EC 81 MG tablet Take 81 mg by mouth daily.    latanoprost 0.005 % ophthalmic solution Commonly known as: XALATAN Place 1 drop into both eyes at bedtime.   levothyroxine 75 MCG tablet Commonly known as: SYNTHROID TAKE 1 TABLET EVERY DAY BEFORE BREAKFAST   Vitamin D3 25 MCG tablet Commonly known as: Vitamin D Take 1 tablet (1,000 Units total) by mouth daily.   zinc sulfate 220 (50 Zn) MG capsule Take 1 capsule (220 mg total) by mouth daily.             Objective:   Physical Exam BP (!) 155/62 (BP Location: Left Arm, Patient Position: Sitting, Cuff Size: Small)   Pulse 80   Temp (!) 97 F (36.1 C) (Temporal)   Resp 18   Ht 5\' 5"  (1.651 m)   Wt 132 lb 8 oz (60.1 kg)   SpO2 99%   BMI 22.05 kg/m   General:   Well developed, NAD, BMI noted. HEENT:  Normocephalic . Face symmetric, atraumatic Lungs:  Dry crackles at bases, more noticeable on the right. Normal respiratory effort, no intercostal retractions, no accessory muscle use. Heart: RRR,  no murmur.  Lower extremities: Good pedal pulses + Swelling, right calf is approximately 1.5 inches larger in circumference compared to the left.  No TTP. No  warmness, redness  Neurologic:  alert & oriented X3.  Speech normal, gait appropriate for age and unassisted Psych--  Cognition and judgment appear intact.  Cooperative with normal attention span and concentration.  Behavior appropriate. No anxious or depressed appearing.      Assessment         Assessment HTN CKD, stage III, Single kidney (absent L one); sees nephrology prn, last visit 2014. (Creatinine 1.8 = clearance ~ 30) Hypothyroidism:  s/p ablation, Dr. Chalmers Cater, no f/u schedule  Glaucoma H/o Gout H/o abnormal MRI abdomen , last MRI 2009, saw GI ---> follow up clinically, redo MRI prn  PLAN:  Right leg swelling: Started 6 days ago, no fever chills, no redness.  Must r/o  DVT.  Will get ultrasound.  If negative will recommend leg elevation, observation. Addendum: Ultrasound show no DVT, see report  below.  There is wall thickening at the right saphenous vein.  Since clinically she has swelling, this could be possibly be active phlebitis.  Will recommend a warm compress and send in doxycycline.  Normal compressibility of the common femoral, superficial femoral, and popliteal veins, as well as the visualized calf veins. Visualized portions of profunda femoral vein are vein unremarkable. No filling defects to suggest DVT on grayscale or color Doppler imaging. Doppler waveforms show normal direction of venous flow, normal respiratory phasicity and response to augmentation. A segment of greater saphenous vein at the RIGHT calf demonstrates wall thickening, question sequela of prior thrombophlebitis.  This visit occurred during the SARS-CoV-2 public health emergency.  Safety protocols were in place, including screening questions prior to the visit, additional usage of staff PPE, and extensive cleaning of exam room while observing appropriate contact time as indicated for disinfecting solutions.

## 2020-01-18 NOTE — Patient Instructions (Signed)
GO TO THE LAB : Get the blood work     STOP BY THE FIRST FLOOR:  get the XR and the ultrasound on your leg  See you in April as schedule  Continue checking your blood pressures at least twice a week  BP GOAL is between 110/65 and  135/85. If it is consistently higher or lower, let me know   Keep the leg elevated If you are not gradually improving in the next few days let me know Call anytime if you have fever, chills or increased swelling on the leg

## 2020-01-18 NOTE — Progress Notes (Signed)
Pre visit review using our clinic review tool, if applicable. No additional management support is needed unless otherwise documented below in the visit note. 

## 2020-01-19 ENCOUNTER — Other Ambulatory Visit: Payer: Medicare HMO

## 2020-01-19 DIAGNOSIS — U071 COVID-19: Secondary | ICD-10-CM | POA: Diagnosis not present

## 2020-01-19 DIAGNOSIS — J1282 Pneumonia due to coronavirus disease 2019: Secondary | ICD-10-CM | POA: Diagnosis not present

## 2020-01-19 LAB — CBC WITH DIFFERENTIAL/PLATELET
Absolute Monocytes: 593 cells/uL (ref 200–950)
Basophils Absolute: 38 cells/uL (ref 0–200)
Basophils Relative: 0.5 %
Eosinophils Absolute: 281 cells/uL (ref 15–500)
Eosinophils Relative: 3.7 %
HCT: 28.4 % — ABNORMAL LOW (ref 35.0–45.0)
Hemoglobin: 9.5 g/dL — ABNORMAL LOW (ref 11.7–15.5)
Lymphs Abs: 1778 cells/uL (ref 850–3900)
MCH: 30.2 pg (ref 27.0–33.0)
MCHC: 33.5 g/dL (ref 32.0–36.0)
MCV: 90.2 fL (ref 80.0–100.0)
MPV: 10.7 fL (ref 7.5–12.5)
Monocytes Relative: 7.8 %
Neutro Abs: 4910 cells/uL (ref 1500–7800)
Neutrophils Relative %: 64.6 %
Platelets: 356 10*3/uL (ref 140–400)
RBC: 3.15 10*6/uL — ABNORMAL LOW (ref 3.80–5.10)
RDW: 17 % — ABNORMAL HIGH (ref 11.0–15.0)
Total Lymphocyte: 23.4 %
WBC: 7.6 10*3/uL (ref 3.8–10.8)

## 2020-01-19 NOTE — Assessment & Plan Note (Signed)
Right leg swelling: Started 6 days ago, no fever chills, no redness.  Must r/o  DVT.  Will get ultrasound.  If negative will recommend leg elevation, observation. Addendum: Ultrasound show no DVT, see report below.  There is wall thickening at the right saphenous vein.  Since clinically she has swelling, this could be possibly be active phlebitis.  Will recommend a warm compress and send in doxycycline.  Normal compressibility of the common femoral, superficial femoral, and popliteal veins, as well as the visualized calf veins. Visualized portions of profunda femoral vein are vein unremarkable. No filling defects to suggest DVT on grayscale or color Doppler imaging. Doppler waveforms show normal direction of venous flow, normal respiratory phasicity and response to augmentation. A segment of greater saphenous vein at the RIGHT calf demonstrates wall thickening, question sequela of prior thrombophlebitis.  Covid pneumonitis: Clinically fully recuperated, on exam she does have dry crackles at bases.  Check a chest x-ray, reassess in April. Anemia: Has chronic anemia, worse during recent admission, GI ROS (-), last colonoscopy 08-2012, + polyps, was due for a checkup in 2018 but patient not interested. Plan: Reticulocyte count, iron TIBC, redo a CBC. HTN: BP today 155/62, at home in the 140s, no change for now, continue Amlodipine RTC already scheduled for April 2021

## 2020-01-20 ENCOUNTER — Other Ambulatory Visit: Payer: Medicare HMO

## 2020-01-20 ENCOUNTER — Ambulatory Visit: Payer: Medicare HMO | Attending: Internal Medicine

## 2020-01-20 DIAGNOSIS — Z23 Encounter for immunization: Secondary | ICD-10-CM

## 2020-01-20 NOTE — Progress Notes (Signed)
   Covid-19 Vaccination Clinic  Name:  [name]    MRN: [id number] DOB: [birth year]  01/20/2020  [name] was observed post Covid-19 immunization for 15 minutes without incidence. She was provided with Vaccine Information Sheet and instruction to access the V-Safe system.   [name] was instructed to call 911 with any severe reactions post vaccine: Marland Kitchen Difficulty breathing  . Swelling of your face and throat  . A fast heartbeat  . A bad rash all over your body  . Dizziness and weakness    Immunizations Administered    Name Date Dose VIS Date Route   Pfizer COVID-19 Vaccine 01/20/2020 12:52 PM 0.3 mL 11/04/2019 Intramuscular   Manufacturer: [place]   Lot: [id number]   [place]: [id number]

## 2020-01-25 ENCOUNTER — Other Ambulatory Visit (INDEPENDENT_AMBULATORY_CARE_PROVIDER_SITE_OTHER): Payer: Medicare HMO

## 2020-01-25 ENCOUNTER — Other Ambulatory Visit: Payer: Self-pay

## 2020-01-25 DIAGNOSIS — D649 Anemia, unspecified: Secondary | ICD-10-CM

## 2020-01-25 LAB — IRON,TIBC AND FERRITIN PANEL
%SAT: 16 % (calc) (ref 16–45)
Ferritin: 41 ng/mL (ref 16–288)
Iron: 36 ug/dL — ABNORMAL LOW (ref 45–160)
TIBC: 225 mcg/dL (calc) — ABNORMAL LOW (ref 250–450)

## 2020-01-25 LAB — RETICULOCYTES
ABS Retic: 45750 cells/uL (ref 20000–8000)
Retic Ct Pct: 1.5 %

## 2020-02-05 ENCOUNTER — Telehealth: Payer: Self-pay | Admitting: Internal Medicine

## 2020-02-05 NOTE — Telephone Encounter (Signed)
Recently seen with leg swelling and possible phlebitis.  Please check on patient, if she is doing better, any concerns?

## 2020-02-06 NOTE — Telephone Encounter (Signed)
-  Right leg swelling improving. -Continue holding losartan unless blood pressures are more than 140/85 consistently. -We can discuss more in April.

## 2020-02-06 NOTE — Telephone Encounter (Signed)
Spoke w/ Pt- she is still having some swelling in R leg but it is improving, denies pain. She wanted to know if she should start back on losartan or continue to be off of it. Was stopped during hosp stay 12/03/2019 (?).

## 2020-02-06 NOTE — Telephone Encounter (Signed)
Spoke w/ Pt- informed of PCP recommendations. Pt verbalized understanding.  

## 2020-02-14 ENCOUNTER — Ambulatory Visit: Payer: Medicare HMO | Attending: Internal Medicine

## 2020-02-14 DIAGNOSIS — Z23 Encounter for immunization: Secondary | ICD-10-CM

## 2020-02-14 NOTE — Progress Notes (Signed)
   Covid-19 Vaccination Clinic  Name:  [name]    MRN: [id number] DOB: 30-Aug-1935  02/14/2020  [name] was observed post Covid-19 immunization for 15 minutes without incident. She was provided with Vaccine Information Sheet and instruction to access the V-Safe system.   [name] was instructed to call 911 with any severe reactions post vaccine: Marland Kitchen Difficulty breathing  . Swelling of face and throat  . A fast heartbeat  . A bad rash all over body  . Dizziness and weakness   Immunizations Administered    Name Date Dose VIS Date Route   Pfizer COVID-19 Vaccine 02/14/2020  3:40 PM 0.3 mL 11/04/2019 Intramuscular   Manufacturer: [place]   Lot: [id number]   [place]: [id number]

## 2020-02-27 ENCOUNTER — Ambulatory Visit: Payer: Medicare HMO | Admitting: Internal Medicine

## 2020-02-28 IMAGING — US US EXTREM LOW VENOUS*R*
1 series · 13 of 24 positions shown · non-contrast
Comparison: None

CLINICAL DATA: RIGHT leg swelling question DVT

EXAM:
RIGHT LOWER EXTREMITY VENOUS DOPPLER ULTRASOUND
TECHNIQUE: Gray-scale sonography with compression, as well as color and duplex
ultrasound, were performed to evaluate the deep venous system(s)
from the level of the common femoral vein through the popliteal and
proximal calf veins.

[Series 1: us extrem low venous*right* · 13 of 31 slices shown]
[im 1/31]
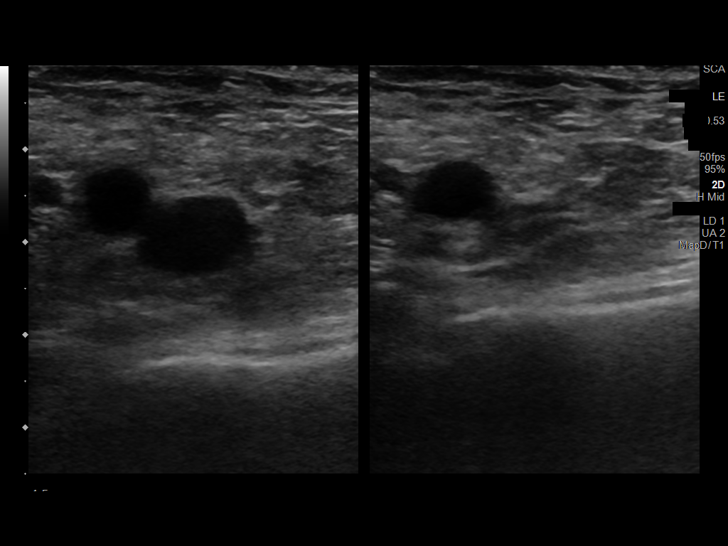
[im 3/31]
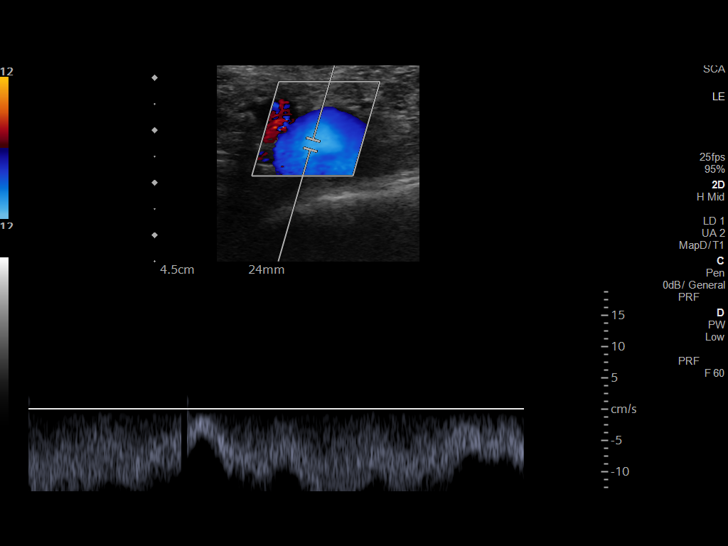
[im 6/31]
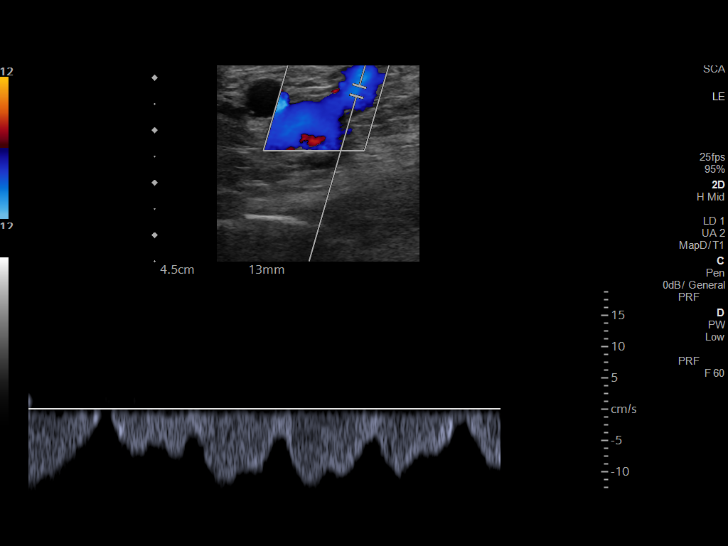
[im 8/31]
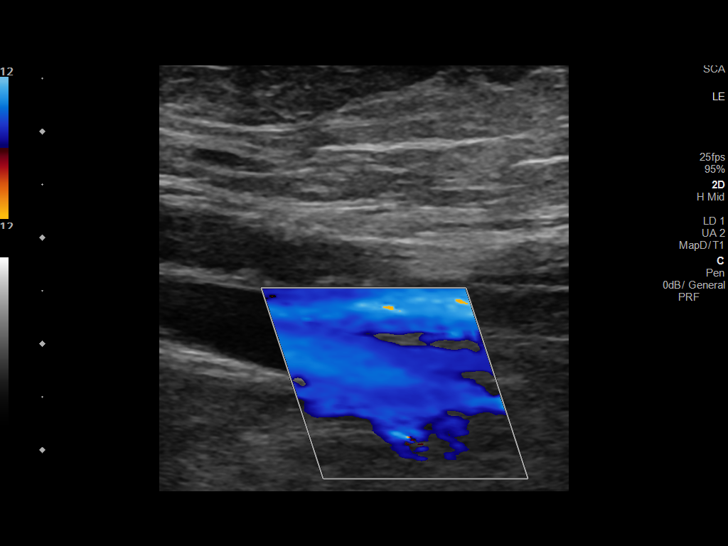
[im 11/31]
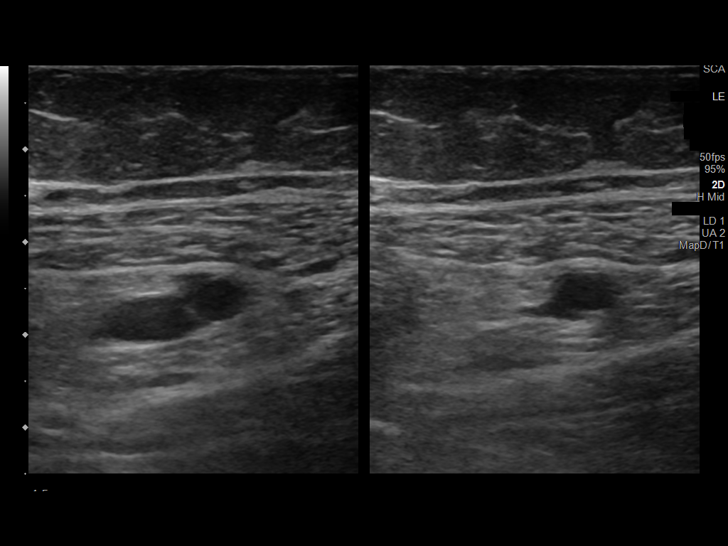
[im 14/31]
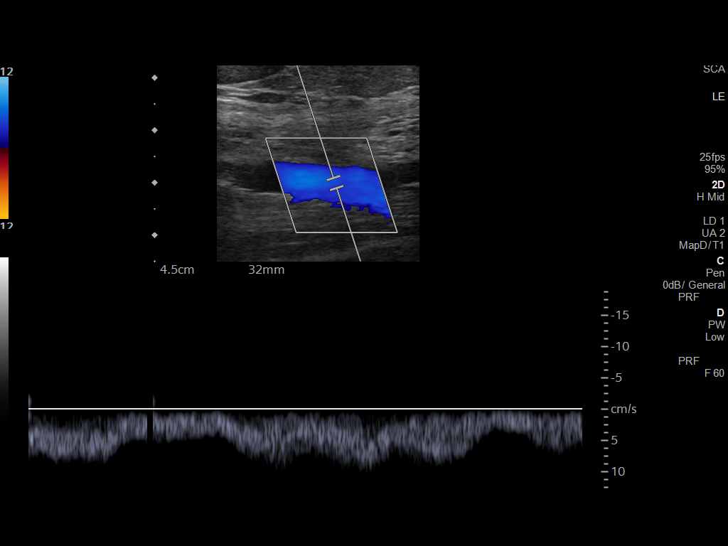
[im 16/31]
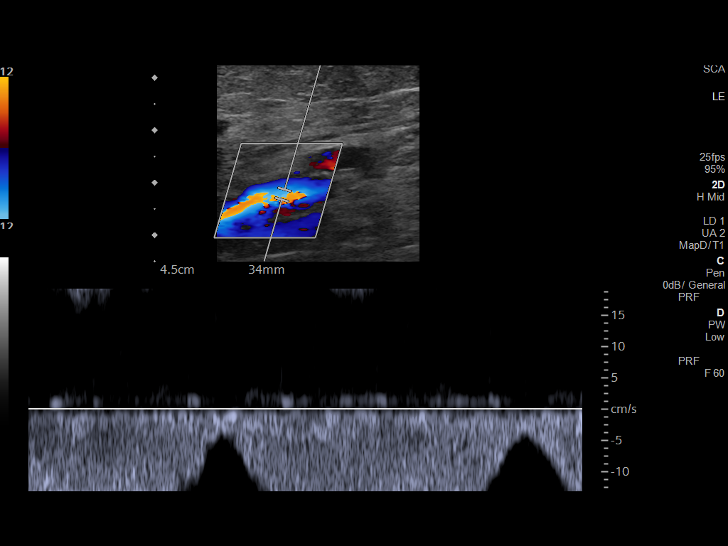
[im 17/31]
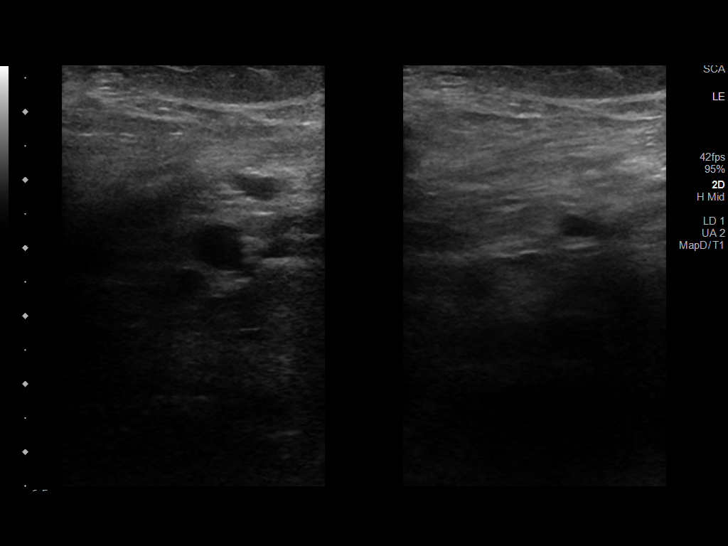
[im 20/31]
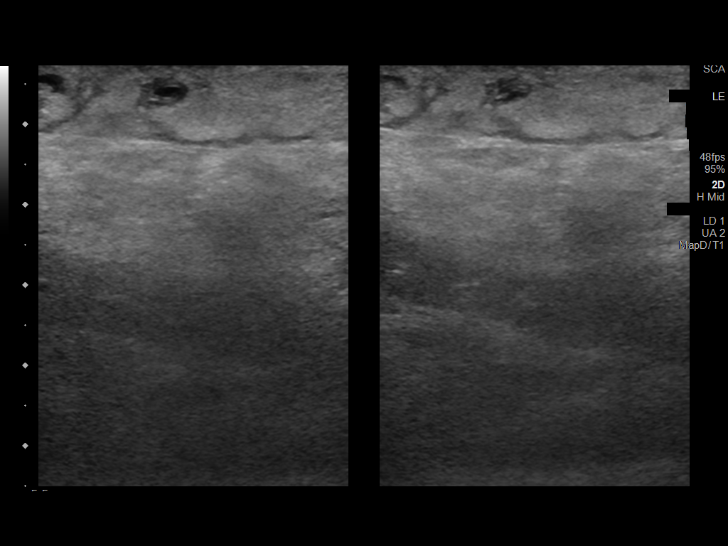
[im 23/31]
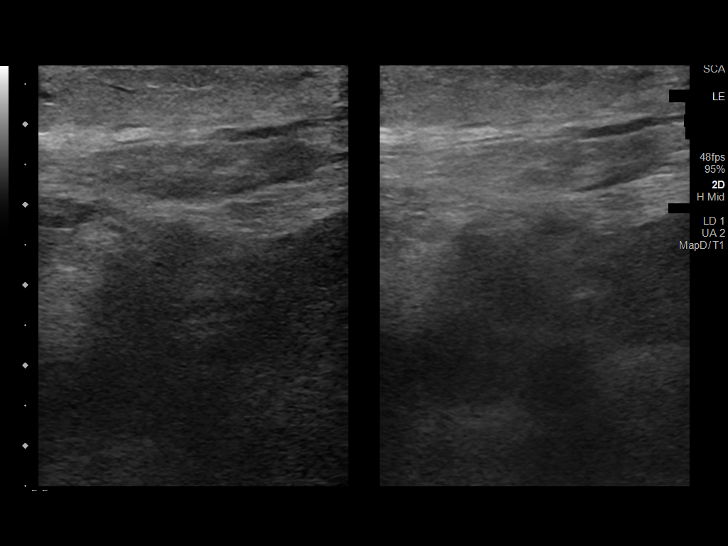
[im 25/31]
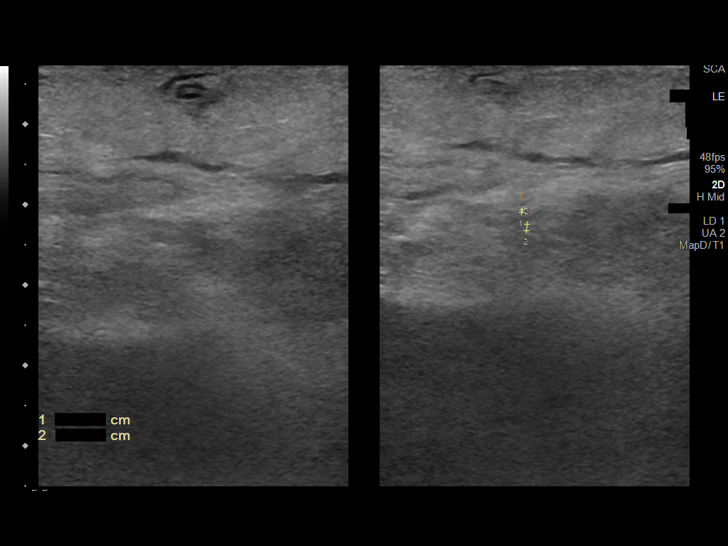
[im 28/31]
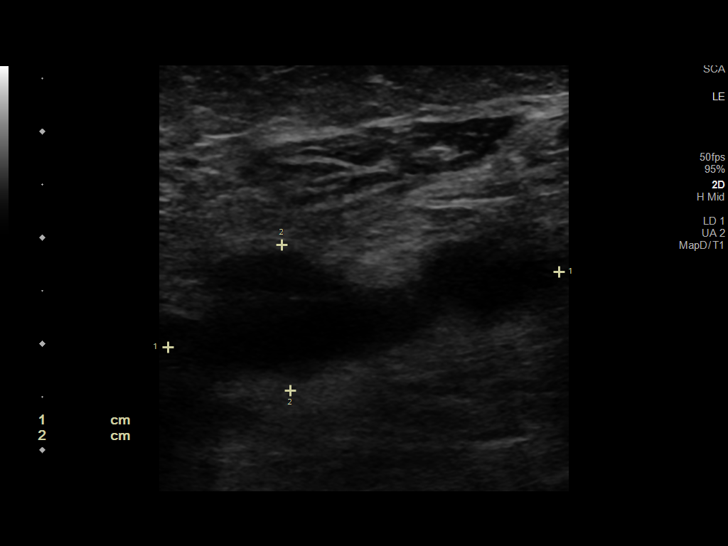
[im 31/31]
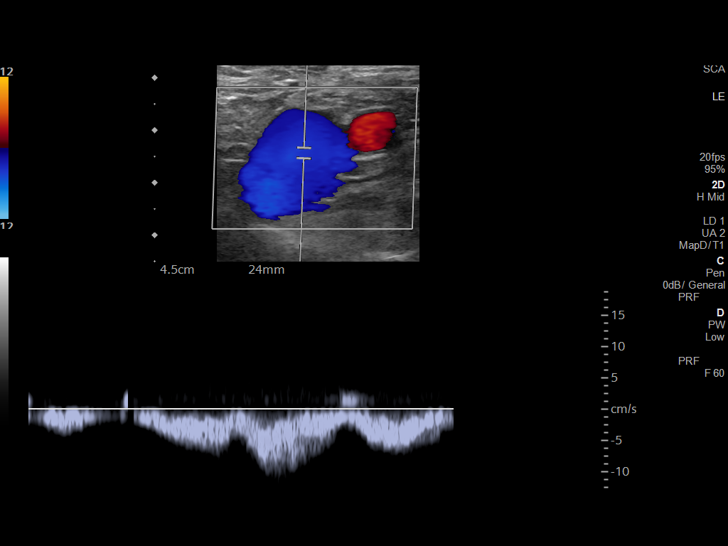

[13 of 24 positions shown; findings below may reference images not displayed]

FINDINGS: VENOUS

Normal compressibility of the common femoral, superficial femoral,
and popliteal veins, as well as the visualized calf veins.
Visualized portions of profunda femoral vein are vein unremarkable.
No filling defects to suggest DVT on grayscale or color Doppler
imaging. Doppler waveforms show normal direction of venous flow,
normal respiratory phasicity and response to augmentation.

A segment of greater saphenous vein at the RIGHT calf demonstrates
wall thickening, question sequela of prior thrombophlebitis.

Limited views of the contralateral common femoral vein are
unremarkable.

OTHER

None.

Limitations: none
IMPRESSION: No femoropopliteal DVT nor evidence of DVT within the visualized
calf veins.

Segmental wall thickening of the greater saphenous vein at the level
of the mid calf suggesting sequela of prior thrombophlebitis.

If clinical symptoms are inconsistent or if there are persistent or
worsening symptoms, further imaging (possibly involving the iliac
veins) may be warranted.

## 2020-02-28 IMAGING — DX DG CHEST 2V
2 series · 2 of 2 positions shown · non-contrast
Comparison: 12/03/2019

CLINICAL DATA: COVID pneumonia, follow-up

EXAM:
CHEST - 2 VIEW

[chest pa]
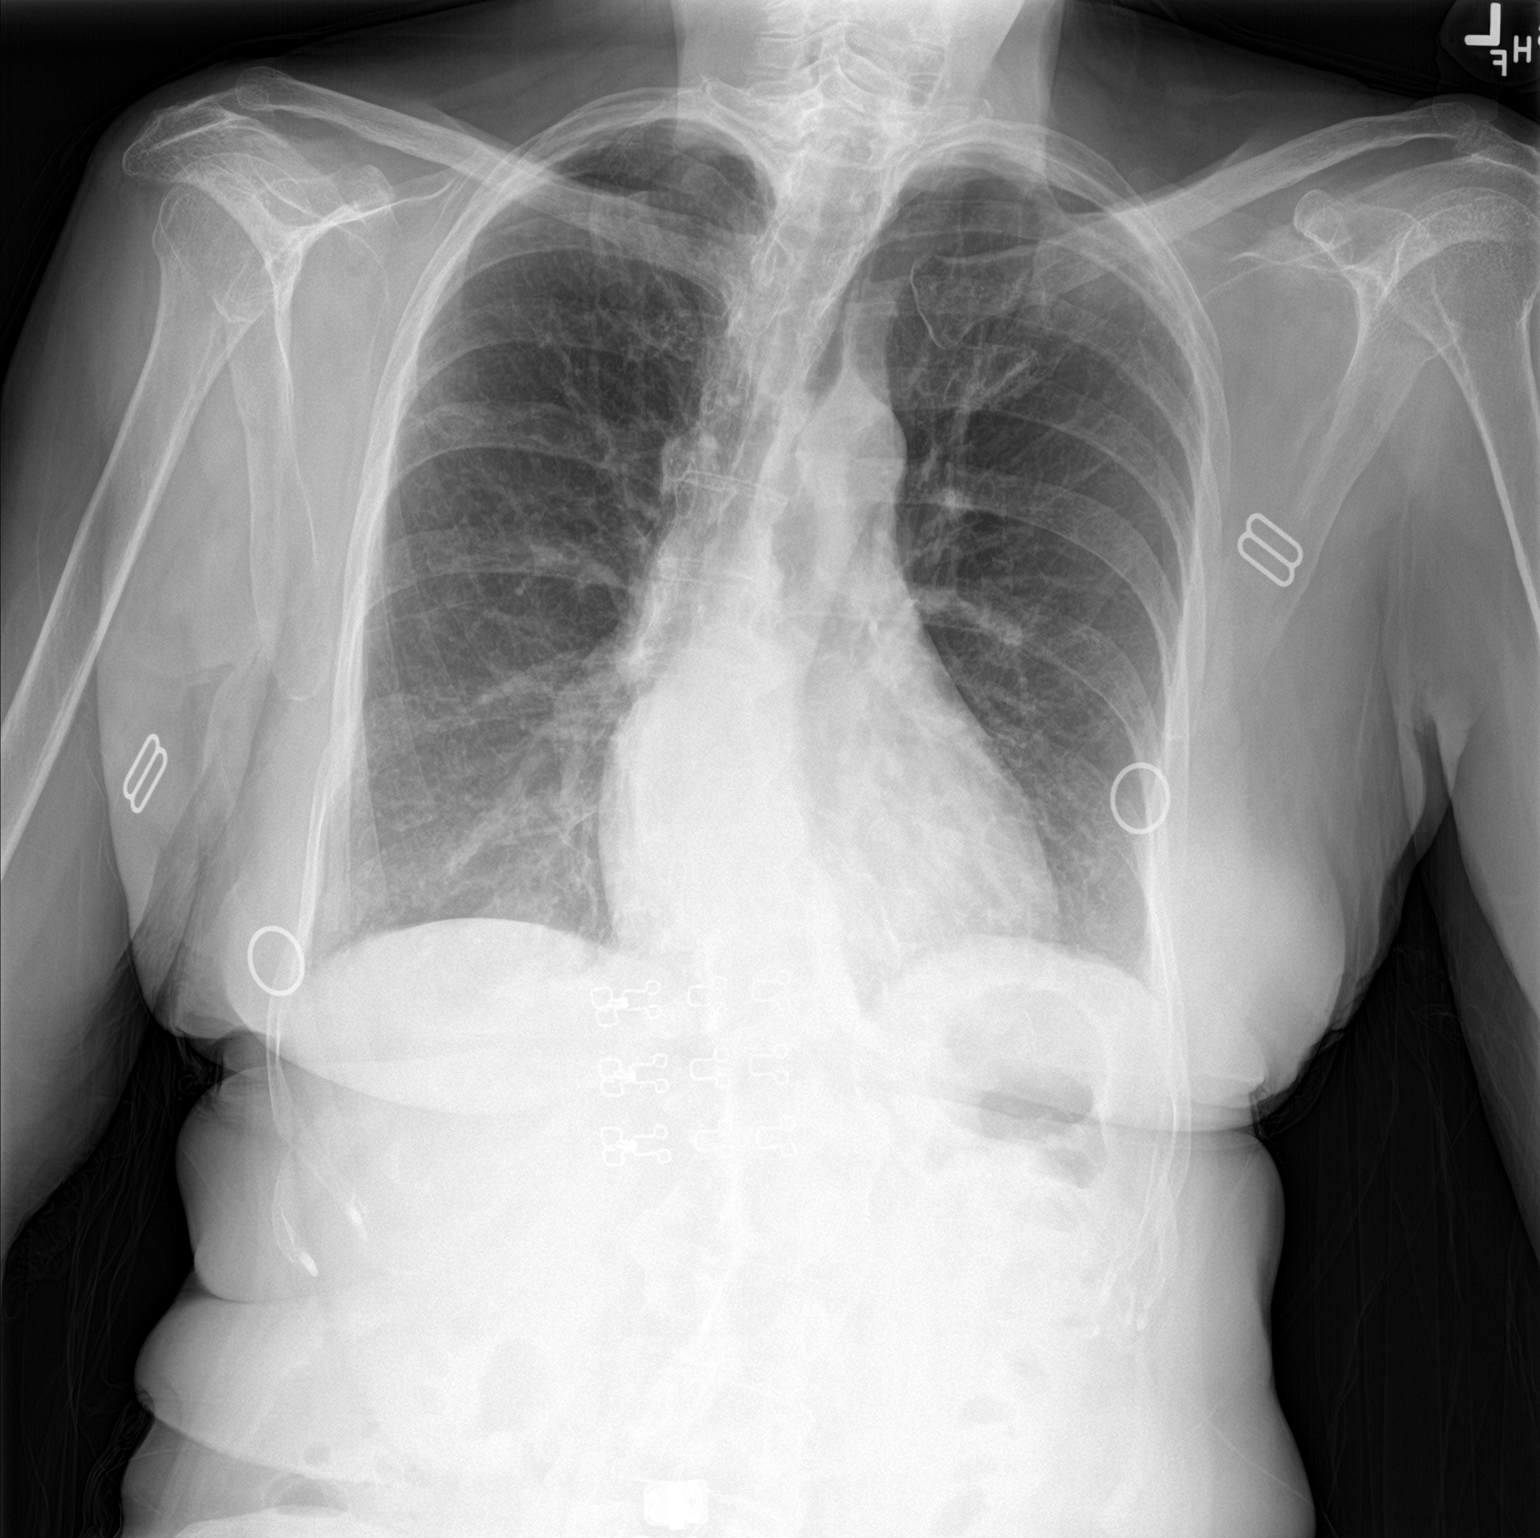

[chest lat]
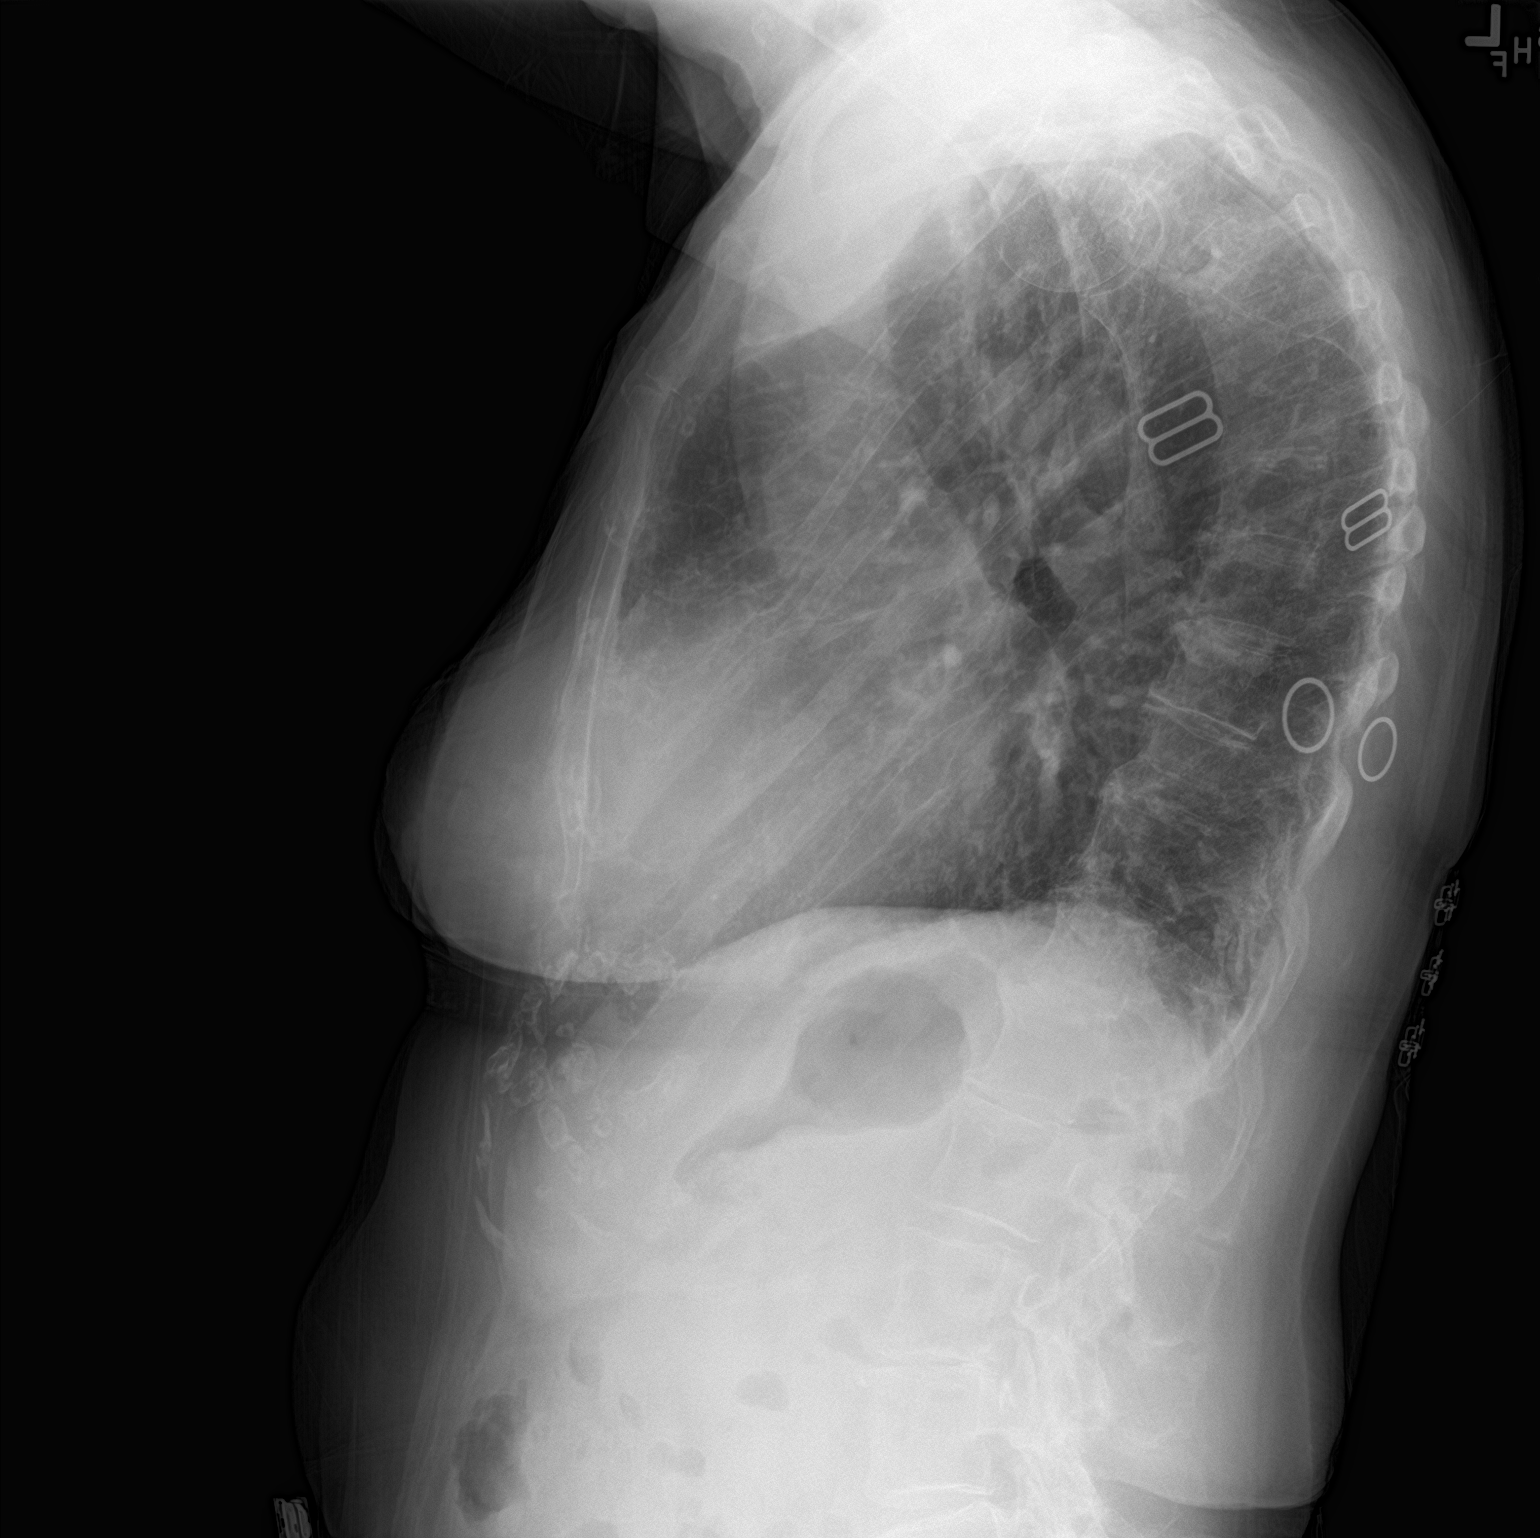

[2 of 2 positions shown; findings below may reference images not displayed]

FINDINGS: Upper normal heart size.

Mediastinal contours and pulmonary vascularity normal.

Lungs emphysematous with minimal atelectasis at medial LEFT lung
base.

No pulmonary infiltrate, pleural effusion or pneumothorax.

Bones demineralized.
IMPRESSION: COPD changes without acute infiltrate.

## 2020-03-02 ENCOUNTER — Encounter: Payer: Self-pay | Admitting: Internal Medicine

## 2020-03-02 ENCOUNTER — Ambulatory Visit (INDEPENDENT_AMBULATORY_CARE_PROVIDER_SITE_OTHER): Payer: Medicare HMO | Admitting: Internal Medicine

## 2020-03-02 ENCOUNTER — Other Ambulatory Visit: Payer: Self-pay

## 2020-03-02 VITALS — BP 144/57 | HR 71 | Temp 96.8°F | Resp 16 | Ht 65.0 in | Wt 132.0 lb

## 2020-03-02 DIAGNOSIS — Z0000 Encounter for general adult medical examination without abnormal findings: Secondary | ICD-10-CM

## 2020-03-02 DIAGNOSIS — M7989 Other specified soft tissue disorders: Secondary | ICD-10-CM

## 2020-03-02 DIAGNOSIS — I809 Phlebitis and thrombophlebitis of unspecified site: Secondary | ICD-10-CM

## 2020-03-02 NOTE — Progress Notes (Addendum)
Subjective:    Patient ID: NESIAH HATHWAY, female    DOB: September 08, 1935, 85 y.o.   MRN: [id number]  DOS:  03/02/2020 Type of visit - description: cpx Since the last office visit, right leg swelling continue. She denies tenderness of the area.   Review of Systems  Other than above, a 14 point review of systems is negative       Past Medical History:  Diagnosis Date  . Abnormal MRI of abdomen    ABNORMAL CT and MRI of pancreas, last MRI 1-09: after d/w GI we rec. to f/u clinically and redo XRs if problems    . Anemia   . CRI (chronic renal insufficiency)    elevated creatinine 4-11: stage 3 CKD, sees nephrology, history of solitary   . Diverticulosis   . Glaucoma   . Gout    h/o   . Hypertension   . Hyperthyroidism    s/p ablation, Dr Debbora Presto    Past Surgical History:  Procedure Laterality Date  . CATARACT EXTRACTION W/ INTRAOCULAR LENS  IMPLANT, BILATERAL  2006  . COLONOSCOPY    . ORIF TIBIA PLATEAU  10/24/2012   Procedure: OPEN REDUCTION INTERNAL FIXATION (ORIF) TIBIAL PLATEAU;  Surgeon: Jessy Oto, MD;  Location: [facility];  Service: Orthopedics;  Laterality: Left;   Family History  Problem Relation Age of Onset  . Heart attack Sister   . Colon cancer Sister 73  . Cancer Sister   . Heart attack Brother   . Pancreatic cancer Brother   . Diabetes Mother   . Esophageal cancer Neg Hx   . Stomach cancer Neg Hx   . Rectal cancer Neg Hx   . Breast cancer Neg Hx     Allergies as of 03/02/2020   No Known Allergies     Medication List       Accurate as of March 02, 2020 11:59 PM. If you have any questions, ask your nurse or doctor.        STOP taking these medications   aspirin EC 81 MG tablet Stopped by: Kathlene November, MD   doxycycline 100 MG tablet Commonly known as: VIBRA-TABS Stopped by: Kathlene November, MD   zinc sulfate 220 (50 Zn) MG capsule Stopped by: Kathlene November, MD     TAKE these medications   acetaminophen 500 MG tablet Commonly known as: TYLENOL Take  500-1,000 mg by mouth every 6 (six) hours as needed for fever or headache (pain).   amLODipine 5 MG tablet Commonly known as: NORVASC Take 1 tablet (5 mg total) by mouth daily.   ascorbic acid 500 MG tablet Commonly known as: VITAMIN C Take 1 tablet (500 mg total) by mouth daily.   latanoprost 0.005 % ophthalmic solution Commonly known as: XALATAN Place 1 drop into both eyes at bedtime.   levothyroxine 75 MCG tablet Commonly known as: SYNTHROID TAKE 1 TABLET EVERY DAY BEFORE BREAKFAST   Vitamin D3 25 MCG tablet Commonly known as: Vitamin D Take 1 tablet (1,000 Units total) by mouth daily.          Objective:   Physical Exam Musculoskeletal:       Legs:    BP (!) 144/57 (BP Location: Left Arm, Patient Position: Sitting, Cuff Size: Small)   Pulse 71   Temp (!) 96.8 F (36 C) (Temporal)   Resp 16   Ht 5\' 5"  (1.651 m)   Wt 132 lb (59.9 kg)   SpO2 100%   BMI 21.97 kg/m  General: Well developed, NAD, BMI noted Neck: No  thyromegaly  HEENT:  Normocephalic . Face symmetric, atraumatic Lungs:  CTA B Normal respiratory effort, no intercostal retractions, no accessory muscle use. Heart: RRR,  no murmur.  Abdomen:  Not distended, soft, non-tender. No rebound or rigidity.   Lower extremities: Right calf is approximately three quarters of inch larger in circumference. Both calves are hyperpigmented, see graphic and picture Neurologic:  alert & oriented X3.  Speech normal, gait appropriate for age and unassisted Strength symmetric and appropriate for age.  Psych: Cognition and judgment appear intact.  Cooperative with normal attention span and concentration.  Behavior appropriate. No anxious or depressed appearing.       Assessment    Assessment HTN CKD, stage III, Single kidney (absent L one); sees nephrology prn, last visit 2014. (Creatinine 1.8 = clearance ~ 30) Hypothyroidism:  s/p ablation, Dr. Chalmers Cater, no f/u schedule  Glaucoma H/o Gout H/o abnormal  MRI abdomen , last MRI 2009, saw GI ---> follow up clinically, redo MRI prn Covid pneumonia, admitted 11-2019  PLAN:  Here for CPX HTN: Currently on amlodipine only, BP satisfactory.  No change CKD: Last BMP satisfactory.  Does not see nephrology regularly. Hypothyroidism: Last TSH satisfactory Right leg swelling: Since the last office visit, ultrasound showed a segment of the greater saphenous vein on the right calf being swollen, was treated as phlebitis, area continue to be swollen, on exam it also feels woody, see physical exam. Plan: Repeat ultrasound.  Consider vascular eval RTC 3 months   This visit occurred during the SARS-CoV-2 public health emergency.  Safety protocols were in place, including screening questions prior to the visit, additional usage of staff PPE, and extensive cleaning of exam room while observing appropriate contact time as indicated for disinfecting solutions.

## 2020-03-02 NOTE — Patient Instructions (Addendum)
Consider a shot called SHINGRIX to prevent shingles    We will schedule a ultrasound of your right leg   GO TO THE FRONT DESK, PLEASE SCHEDULE YOUR APPOINTMENTS Come back for for a checkup in 3 months

## 2020-03-02 NOTE — Progress Notes (Signed)
Pre visit review using our clinic review tool, if applicable. No additional management support is needed unless otherwise documented below in the visit note. 

## 2020-03-04 NOTE — Assessment & Plan Note (Signed)
Here for CPX HTN: Currently on amlodipine only, BP satisfactory.  No change CKD: Last BMP satisfactory.  Does not see nephrology regularly. Hypothyroidism: Last TSH satisfactory Right leg swelling: Since the last office visit, ultrasound showed a segment of the greater saphenous vein on the right calf being swollen, was treated as phlebitis, area continue to be swollen, on exam it also feels woody, see physical exam. Plan: Repeat ultrasound.  Consider vascular eval RTC 3 months

## 2020-03-04 NOTE — Assessment & Plan Note (Signed)
-  Td: 2015 - PNM 23: 2015; prevnar : 2016 - shingrex : declined before - had covid shots -CCS:   today she confirmed is not interested in further screening - cervical Ca screening: no further PAPs, see previous entries  -breast ca screening: last MMG 2018, no h/o abnormal MMGs, discussed options, elected to stop screenings - (+) FH CAD: Okay to stop aspirin   - DEXA?   Declined consistently before  -diet and  exercise discussed

## 2020-03-09 ENCOUNTER — Other Ambulatory Visit: Payer: Self-pay

## 2020-03-09 ENCOUNTER — Ambulatory Visit (HOSPITAL_BASED_OUTPATIENT_CLINIC_OR_DEPARTMENT_OTHER)
Admission: RE | Admit: 2020-03-09 | Discharge: 2020-03-09 | Disposition: A | Discharge status: Home / Self Care | Payer: Medicare HMO | Source: Ambulatory Visit | Attending: Internal Medicine | Admitting: Internal Medicine

## 2020-03-09 DIAGNOSIS — M7989 Other specified soft tissue disorders: Secondary | ICD-10-CM | POA: Diagnosis not present

## 2020-03-09 DIAGNOSIS — I8001 Phlebitis and thrombophlebitis of superficial vessels of right lower extremity: Secondary | ICD-10-CM | POA: Diagnosis not present

## 2020-03-16 NOTE — Addendum Note (Signed)
Addended byDamita Dunnings D on: 03/16/2020 12:44 PM   Modules accepted: Orders

## 2020-03-28 ENCOUNTER — Telehealth: Payer: Self-pay

## 2020-03-28 ENCOUNTER — Other Ambulatory Visit: Payer: Self-pay | Admitting: Internal Medicine

## 2020-03-28 NOTE — Telephone Encounter (Signed)
VASCULAR AND VAINSPECIALIST Called in to speak with a referral specialist  to provide an update on this patients referral. Please give there office a call at [phone] as soon as possible thanks.

## 2020-03-28 NOTE — Telephone Encounter (Addendum)
LM on VM for Chanda to call me back in regards to referral.

## 2020-03-29 MED ORDER — AMLODIPINE BESYLATE 5 MG PO TABS: 5.0000 mg | ORAL_TABLET | Freq: Every day | ORAL | 0 refills | Status: DC

## 2020-04-05 ENCOUNTER — Telehealth: Payer: Self-pay | Admitting: Internal Medicine

## 2020-04-05 NOTE — Telephone Encounter (Signed)
Message received from   vascular surgery: Patient not appropriate for referral. I spoke with the patient today, states the area of phlebitis is much better. We agreed to follow-up in July as a scheduled

## 2020-04-19 IMAGING — US US EXTREM LOW VENOUS*R*
1 series · 13 of 24 positions shown · non-contrast
Comparison: Right lower extremity venous Doppler ultrasound -
01/18/2020 (nonocclusive SVT involving the right greater saphenous
vein)

CLINICAL DATA: Chronic right lower extremity edema. History of
previous DVT. Evaluate for acute or chronic DVT.



[Series 1: us extrem low venous*right* · 13 of 31 slices shown]
[im 1/31]
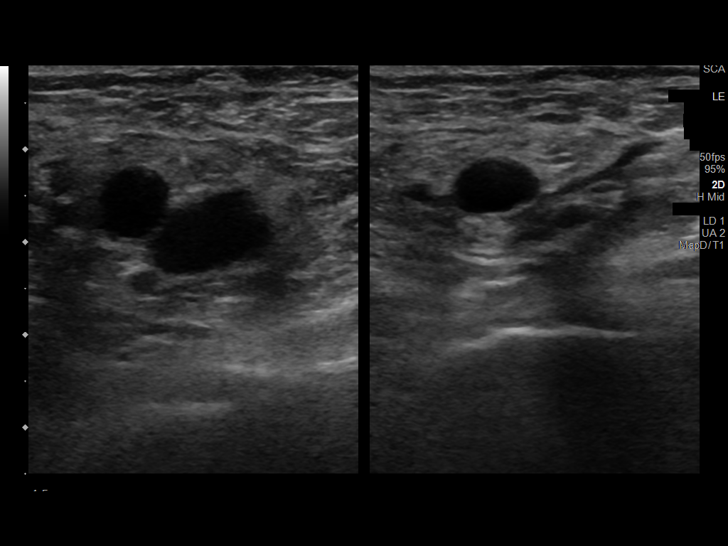
[im 3/31]
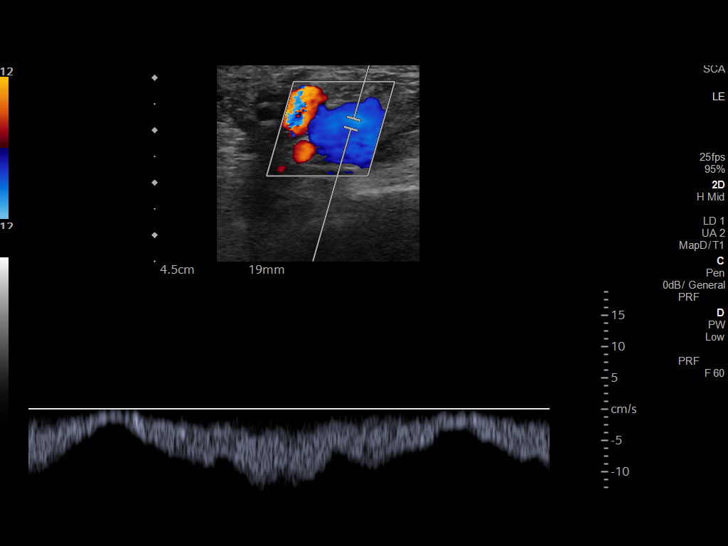
[im 6/31]
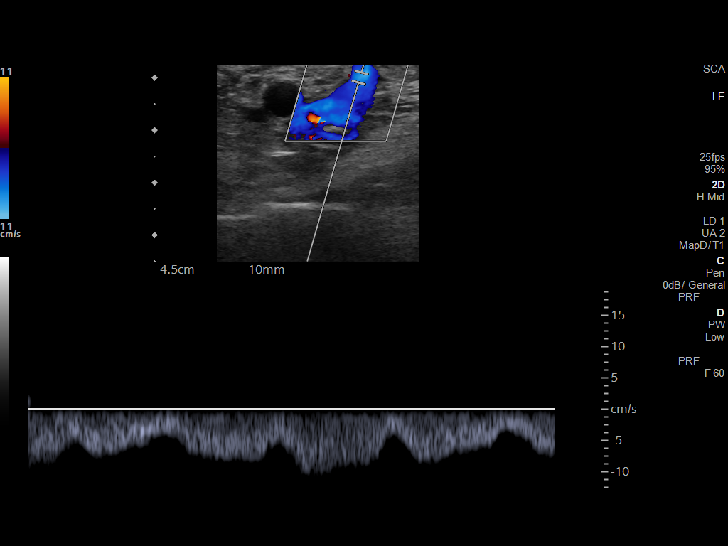
[im 8/31]
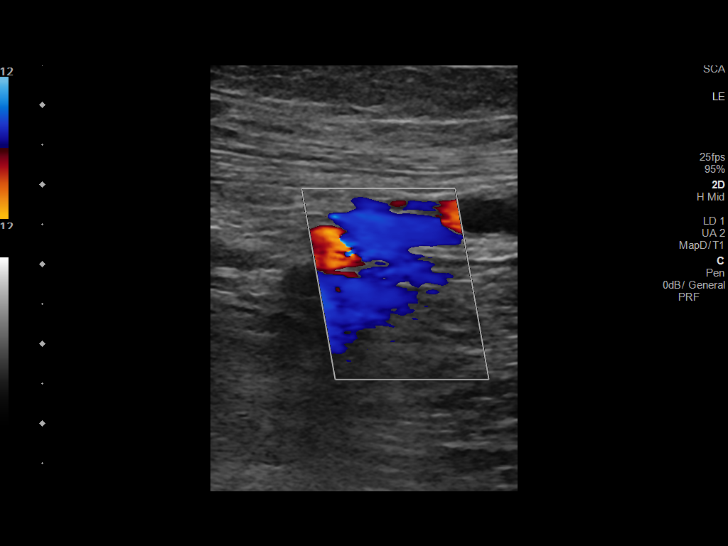
[im 11/31]
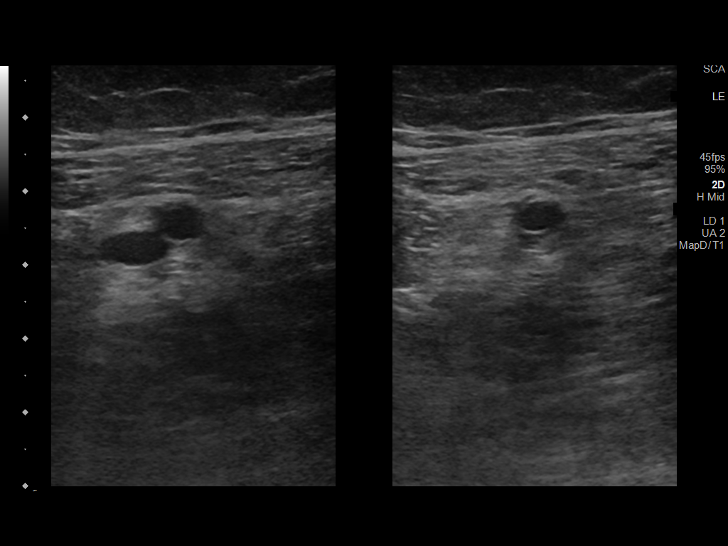
[im 14/31]
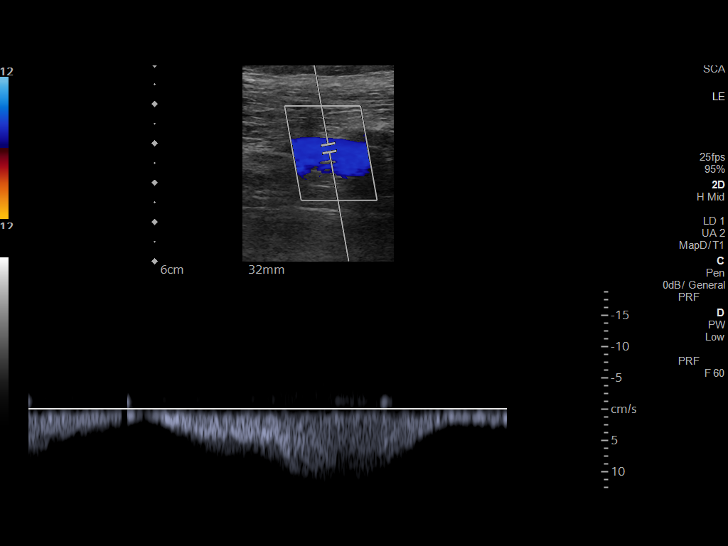
[im 16/31]
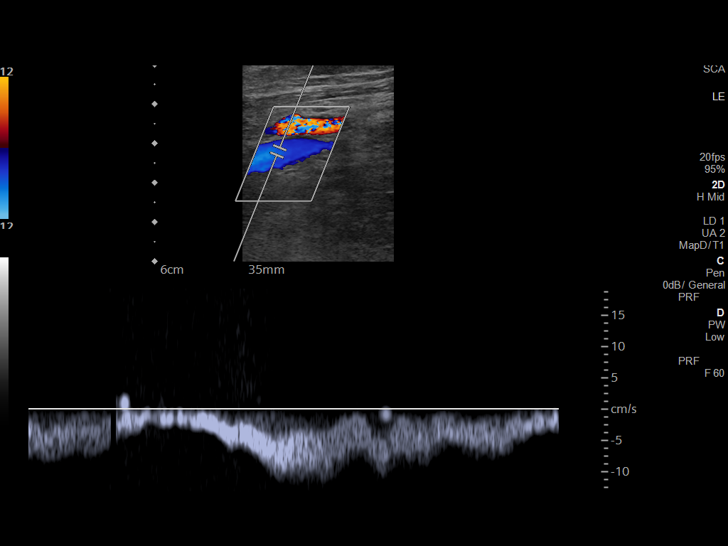
[im 17/31]
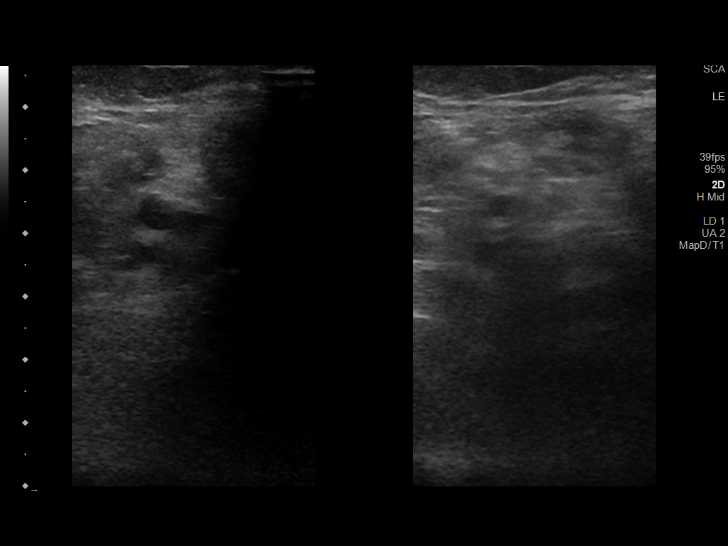
[im 20/31]
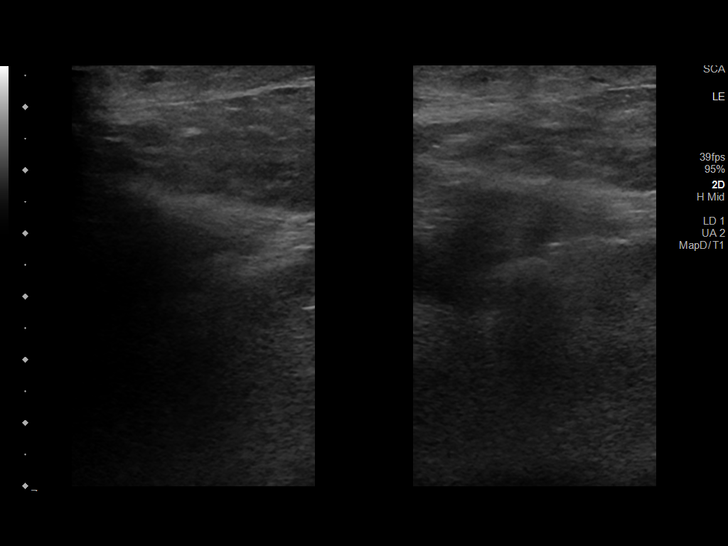
[im 23/31]
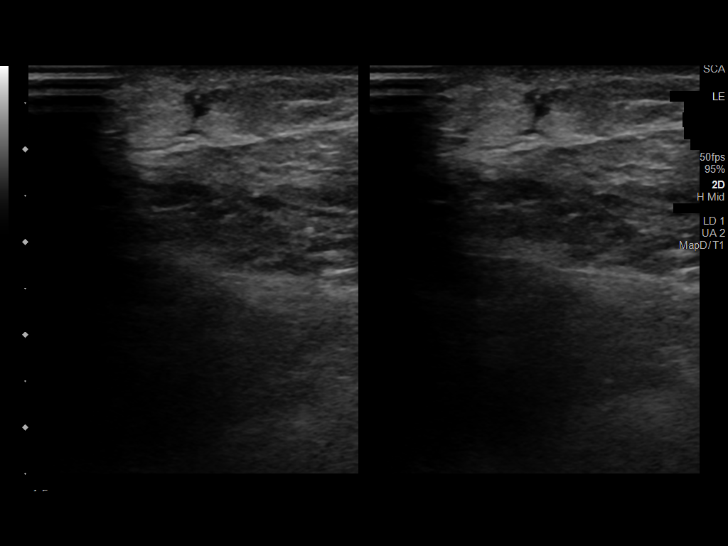
[im 25/31]
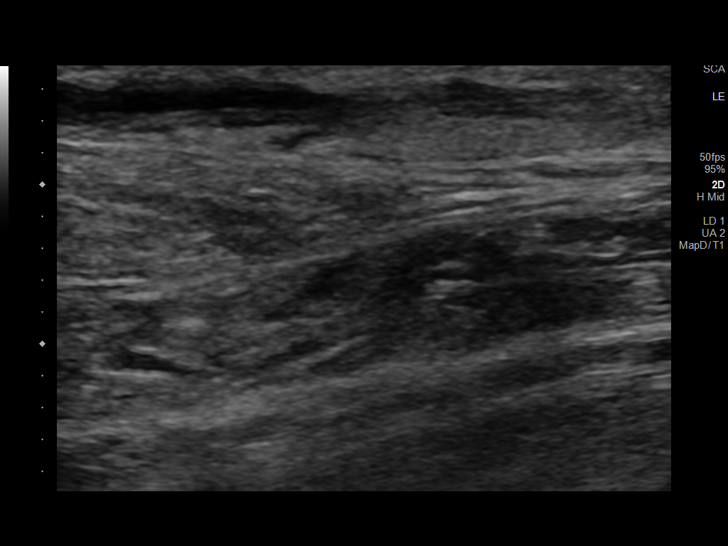
[im 28/31]
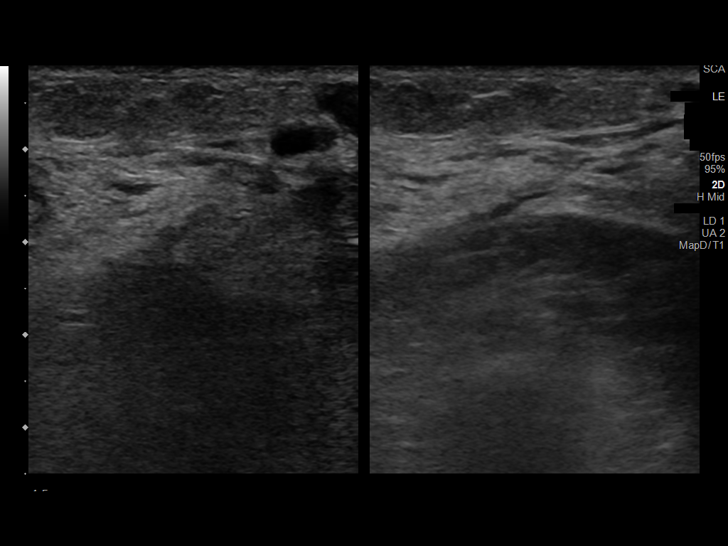
[im 31/31]
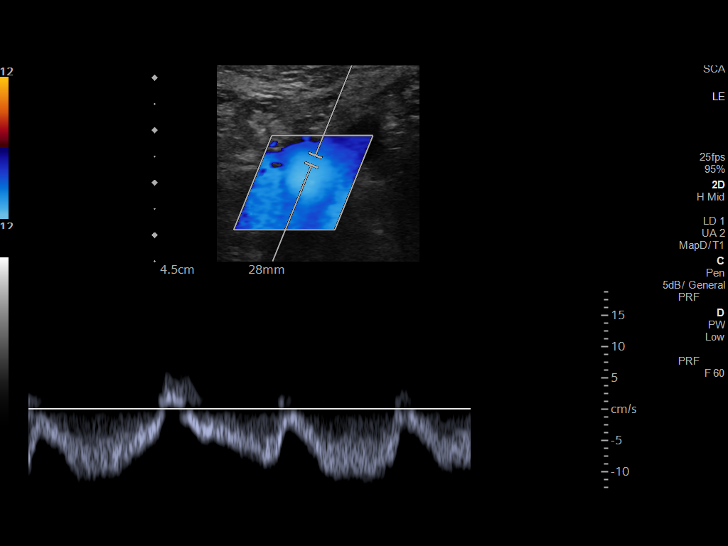

[13 of 24 positions shown; findings below may reference images not displayed]

FINDINGS: Contralateral Common Femoral Vein: Respiratory phasicity is normal
and symmetric with the symptomatic side. No evidence of thrombus.
Normal compressibility.

Common Femoral Vein: No evidence of thrombus. Normal
compressibility, respiratory phasicity and response to augmentation.

Saphenofemoral Junction: No evidence of thrombus. Normal
compressibility and flow on color Doppler imaging.

Profunda Femoral Vein: No evidence of thrombus. Normal
compressibility and flow on color Doppler imaging.

Femoral Vein: No evidence of thrombus. Normal compressibility,
respiratory phasicity and response to augmentation.

Popliteal Vein: No evidence of thrombus. Normal compressibility,
respiratory phasicity and response to augmentation.

Calf Veins: No evidence of thrombus. Normal compressibility and flow
on color Doppler imaging.

Superficial Great Saphenous Vein: Redemonstrated mixed echogenic
nonocclusive thrombus within the greater saphenous vein at the level
of the mid calf (image 24 through 27)

Venous Reflux:  None.

Other Findings:  None.
IMPRESSION: 1. No evidence of acute or chronic DVT within the right lower
extremity.
2. The examination is positive for chronic near occlusive
superficial thrombophlebitis involving the greater saphenous vein at
the level of the calf, grossly unchanged compared to the [DATE]
examination and without evidence of central propagation.

## 2020-05-25 ENCOUNTER — Other Ambulatory Visit: Payer: Self-pay | Admitting: Internal Medicine

## 2020-06-01 ENCOUNTER — Ambulatory Visit: Payer: Medicare HMO | Admitting: Internal Medicine

## 2020-06-08 ENCOUNTER — Encounter: Payer: Self-pay | Admitting: Internal Medicine

## 2020-06-08 ENCOUNTER — Ambulatory Visit (INDEPENDENT_AMBULATORY_CARE_PROVIDER_SITE_OTHER): Payer: Medicare HMO | Admitting: Internal Medicine

## 2020-06-08 ENCOUNTER — Other Ambulatory Visit: Payer: Self-pay

## 2020-06-08 VITALS — BP 153/80 | HR 83 | Temp 98.2°F | Resp 18 | Ht 65.0 in | Wt 130.4 lb

## 2020-06-08 DIAGNOSIS — N1832 Chronic kidney disease, stage 3b: Secondary | ICD-10-CM

## 2020-06-08 DIAGNOSIS — I129 Hypertensive chronic kidney disease with stage 1 through stage 4 chronic kidney disease, or unspecified chronic kidney disease: Secondary | ICD-10-CM | POA: Diagnosis not present

## 2020-06-08 DIAGNOSIS — I10 Essential (primary) hypertension: Secondary | ICD-10-CM

## 2020-06-08 LAB — CBC WITH DIFFERENTIAL/PLATELET
Basophils Absolute: 0 10*3/uL (ref 0.0–0.1)
Basophils Relative: 0.5 % (ref 0.0–3.0)
Eosinophils Absolute: 0.2 10*3/uL (ref 0.0–0.7)
Eosinophils Relative: 3.1 % (ref 0.0–5.0)
HCT: 31 % — ABNORMAL LOW (ref 36.0–46.0)
Hemoglobin: 10.1 g/dL — ABNORMAL LOW (ref 12.0–15.0)
Lymphocytes Relative: 28.5 % (ref 12.0–46.0)
Lymphs Abs: 1.7 10*3/uL (ref 0.7–4.0)
MCHC: 32.6 g/dL (ref 30.0–36.0)
MCV: 85.8 fl (ref 78.0–100.0)
Monocytes Absolute: 0.5 10*3/uL (ref 0.1–1.0)
Monocytes Relative: 8.4 % (ref 3.0–12.0)
Neutro Abs: 3.5 10*3/uL (ref 1.4–7.7)
Neutrophils Relative %: 59.5 % (ref 43.0–77.0)
Platelets: 359 10*3/uL (ref 150.0–400.0)
RBC: 3.61 Mil/uL — ABNORMAL LOW (ref 3.87–5.11)
RDW: 17.5 % — ABNORMAL HIGH (ref 11.5–15.5)
WBC: 5.8 10*3/uL (ref 4.0–10.5)

## 2020-06-08 LAB — BASIC METABOLIC PANEL
BUN: 31 mg/dL — ABNORMAL HIGH (ref 6–23)
CO2: 24 mEq/L (ref 19–32)
Calcium: 10 mg/dL (ref 8.4–10.5)
Chloride: 107 mEq/L (ref 96–112)
Creatinine, Ser: 1.77 mg/dL — ABNORMAL HIGH (ref 0.40–1.20)
GFR: 33.14 mL/min — ABNORMAL LOW (ref >60.00)
Glucose, Bld: 84 mg/dL (ref 70–99)
Potassium: 4.4 mEq/L (ref 3.5–5.1)
Sodium: 137 mEq/L (ref 135–145)

## 2020-06-08 NOTE — Progress Notes (Signed)
Subjective:    Patient ID: [name], female    DOB: 01-17-1935, 85 y.o.   MRN: [id number]  DOS:  06/08/2020 Type of visit - description: Routine checkup Feels well. Today we talk about HTN, renal failure, thrombophlebitis.  Review of Systems Denies chest pain or difficulty breathing.  No edema  Past Medical History:  Diagnosis Date   Abnormal MRI of abdomen    ABNORMAL CT and MRI of pancreas, last MRI 1-09: after d/w GI we rec. to f/u clinically and redo XRs if problems     Anemia    CRI (chronic renal insufficiency)    elevated creatinine 4-11: stage 3 CKD, sees nephrology, history of solitary    Diverticulosis    Glaucoma    Gout    h/o    Hypertension    Hyperthyroidism    s/p ablation, Dr Debbora Presto    Past Surgical History:  Procedure Laterality Date   CATARACT EXTRACTION W/ INTRAOCULAR LENS  IMPLANT, BILATERAL  2006   COLONOSCOPY     ORIF TIBIA PLATEAU  10/24/2012   Procedure: OPEN REDUCTION INTERNAL FIXATION (ORIF) TIBIAL PLATEAU;  Surgeon: Jessy Oto, MD;  Location: [facility];  Service: Orthopedics;  Laterality: Left;    Allergies as of 06/08/2020   No Known Allergies     Medication List       Accurate as of June 08, 2020 11:59 PM. If you have any questions, ask your nurse or doctor.        acetaminophen 500 MG tablet Commonly known as: TYLENOL Take 500-1,000 mg by mouth every 6 (six) hours as needed for fever or headache (pain).   amLODipine 5 MG tablet Commonly known as: NORVASC Take 1 tablet (5 mg total) by mouth daily.   ascorbic acid 500 MG tablet Commonly known as: VITAMIN C Take 1 tablet (500 mg total) by mouth daily.   latanoprost 0.005 % ophthalmic solution Commonly known as: XALATAN Place 1 drop into both eyes at bedtime.   levothyroxine 75 MCG tablet Commonly known as: SYNTHROID TAKE 1 TABLET EVERY DAY BEFORE BREAKFAST   Vitamin D3 25 MCG tablet Commonly known as: Vitamin D Take 1 tablet (1,000 Units total) by mouth  daily.          Objective:   Physical Exam BP (!) 153/80 (BP Location: Left Arm, Patient Position: Sitting, Cuff Size: Small)    Pulse 83    Temp 98.2 F (36.8 C) (Oral)    Resp 18    Ht 5\' 5"  (1.651 m)    Wt 130 lb 6 oz (59.1 kg)    SpO2 99%    BMI 21.70 kg/m  General:   Well developed, NAD, BMI noted. HEENT:  Normocephalic . Face symmetric, atraumatic Lungs:  CTA B Normal respiratory effort, no intercostal retractions, no accessory muscle use. Heart: RRR,  no murmur.  Lower extremities: no pretibial edema bilaterally  Skin: Not pale. Not jaundice Neurologic:  alert & oriented X3.  Speech normal, gait appropriate for age and unassisted Psych--  Cognition and judgment appear intact.  Cooperative with normal attention span and concentration.  Behavior appropriate. No anxious or depressed appearing.      Assessment     Assessment HTN CKD, stage III, Single kidney (absent L one); sees nephrology prn, last visit 2014. (Creatinine 1.8 = clearance ~ 30) Hypothyroidism:  s/p ablation, Dr. Chalmers Cater, no f/u schedule  Glaucoma H/o Gout H/o abnormal MRI abdomen , last MRI 2009, saw GI ---> follow  up clinically, redo MRI prn Covid pneumonia, admitted 11-2019  PLAN:  HTN: Currently on amlodipine, used to be on ARBs.  BP today slightly elevated, recheck: 140/80, very close to her goal of 130/80.  No change for now. CKD: Creatinine stable, she has developed anemia, likely from chronic disease, may benefit from EPO, referred to nephrology, has not been seen in years.  Recheck a BMP and CBC. Thrombophlebitis: Since the last visit, she has improved.  Observation RTC 3 months    This visit occurred during the SARS-CoV-2 public health emergency.  Safety protocols were in place, including screening questions prior to the visit, additional usage of staff PPE, and extensive cleaning of exam room while observing appropriate contact time as indicated for disinfecting solutions.

## 2020-06-08 NOTE — Patient Instructions (Addendum)
Check the  blood pressure 2 or 3 times a week  BP GOAL is between 110/65 and  130/80. If it is consistently higher or lower, let me know  We are referring you to the nephrologist (kidney doctor) if you do not hear from them in 2 weeks let me know.  GO TO THE LAB : Get the blood work     [name], [place] back for   for a checkup in 6 months

## 2020-06-08 NOTE — Progress Notes (Signed)
Pre visit review using our clinic review tool, if applicable. No additional management support is needed unless otherwise documented below in the visit note. 

## 2020-06-09 NOTE — Assessment & Plan Note (Signed)
HTN: Currently on amlodipine, used to be on ARBs.  BP today slightly elevated, recheck: 140/80, very close to her goal of 130/80.  No change for now. CKD: Creatinine stable, she has developed anemia, likely from chronic disease, may benefit from EPO, referred to nephrology, has not been seen in years.  Recheck a BMP and CBC. Thrombophlebitis: Since the last visit, she has improved.  Observation RTC 3 months

## 2020-07-14 ENCOUNTER — Other Ambulatory Visit: Payer: Self-pay | Admitting: Internal Medicine

## 2020-07-14 DIAGNOSIS — E039 Hypothyroidism, unspecified: Secondary | ICD-10-CM

## 2020-08-02 ENCOUNTER — Other Ambulatory Visit: Payer: Self-pay | Admitting: Internal Medicine

## 2020-08-03 DIAGNOSIS — N1832 Chronic kidney disease, stage 3b: Secondary | ICD-10-CM | POA: Diagnosis not present

## 2020-08-03 DIAGNOSIS — I129 Hypertensive chronic kidney disease with stage 1 through stage 4 chronic kidney disease, or unspecified chronic kidney disease: Secondary | ICD-10-CM | POA: Diagnosis not present

## 2020-08-03 DIAGNOSIS — Q600 Renal agenesis, unilateral: Secondary | ICD-10-CM | POA: Diagnosis not present

## 2020-11-20 ENCOUNTER — Other Ambulatory Visit: Payer: Self-pay | Admitting: Internal Medicine

## 2020-11-20 DIAGNOSIS — E039 Hypothyroidism, unspecified: Secondary | ICD-10-CM

## 2020-11-27 ENCOUNTER — Ambulatory Visit: Payer: Medicare HMO | Admitting: Internal Medicine

## 2020-11-27 ENCOUNTER — Other Ambulatory Visit: Payer: Self-pay

## 2020-11-27 DIAGNOSIS — Z23 Encounter for immunization: Secondary | ICD-10-CM

## 2020-12-10 ENCOUNTER — Ambulatory Visit: Payer: Medicare HMO | Admitting: Internal Medicine

## 2020-12-24 ENCOUNTER — Other Ambulatory Visit: Payer: Self-pay | Admitting: Internal Medicine

## 2020-12-24 ENCOUNTER — Ambulatory Visit: Payer: Medicare HMO | Admitting: Internal Medicine

## 2021-01-07 ENCOUNTER — Ambulatory Visit (INDEPENDENT_AMBULATORY_CARE_PROVIDER_SITE_OTHER): Payer: Medicare HMO | Admitting: Internal Medicine

## 2021-01-07 ENCOUNTER — Encounter: Payer: Self-pay | Admitting: Internal Medicine

## 2021-01-07 ENCOUNTER — Other Ambulatory Visit: Payer: Self-pay

## 2021-01-07 VITALS — BP 154/80 | HR 76 | Temp 98.0°F | Resp 18 | Ht 65.0 in | Wt 130.4 lb

## 2021-01-07 DIAGNOSIS — E039 Hypothyroidism, unspecified: Secondary | ICD-10-CM

## 2021-01-07 DIAGNOSIS — N1832 Chronic kidney disease, stage 3b: Secondary | ICD-10-CM

## 2021-01-07 DIAGNOSIS — I10 Essential (primary) hypertension: Secondary | ICD-10-CM

## 2021-01-07 LAB — CBC WITH DIFFERENTIAL/PLATELET
Basophils Absolute: 0 10*3/uL (ref 0.0–0.1)
Basophils Relative: 0.6 % (ref 0.0–3.0)
Eosinophils Absolute: 0.1 10*3/uL (ref 0.0–0.7)
Eosinophils Relative: 2.3 % (ref 0.0–5.0)
HCT: 31.4 % — ABNORMAL LOW (ref 36.0–46.0)
Hemoglobin: 10.1 g/dL — ABNORMAL LOW (ref 12.0–15.0)
Lymphocytes Relative: 29.7 % (ref 12.0–46.0)
Lymphs Abs: 1.4 10*3/uL (ref 0.7–4.0)
MCHC: 32.3 g/dL (ref 30.0–36.0)
MCV: 86.3 fl (ref 78.0–100.0)
Monocytes Absolute: 0.4 10*3/uL (ref 0.1–1.0)
Monocytes Relative: 8 % (ref 3.0–12.0)
Neutro Abs: 2.8 10*3/uL (ref 1.4–7.7)
Neutrophils Relative %: 59.4 % (ref 43.0–77.0)
Platelets: 341 10*3/uL (ref 150.0–400.0)
RBC: 3.63 Mil/uL — ABNORMAL LOW (ref 3.87–5.11)
RDW: 18.6 % — ABNORMAL HIGH (ref 11.5–15.5)
WBC: 4.7 10*3/uL (ref 4.0–10.5)

## 2021-01-07 LAB — BASIC METABOLIC PANEL
BUN: 21 mg/dL (ref 6–23)
CO2: 26 mEq/L (ref 19–32)
Calcium: 9.7 mg/dL (ref 8.4–10.5)
Chloride: 105 mEq/L (ref 96–112)
Creatinine, Ser: 1.75 mg/dL — ABNORMAL HIGH (ref 0.40–1.20)
GFR: 26.57 mL/min — ABNORMAL LOW (ref >60.00)
Glucose, Bld: 80 mg/dL (ref 70–99)
Potassium: 4.9 mEq/L (ref 3.5–5.1)
Sodium: 140 mEq/L (ref 135–145)

## 2021-01-07 LAB — TSH: TSH: 0.14 u[IU]/mL — ABNORMAL LOW (ref 0.35–4.50)

## 2021-01-07 MED ORDER — AMLODIPINE BESYLATE 10 MG PO TABS: 10.0000 mg | ORAL_TABLET | Freq: Every day | ORAL | 1 refills | Status: DC

## 2021-01-07 NOTE — Patient Instructions (Addendum)
Please bring [place] a copy of your Advanced Directives for your chart. (Living will, Healthcare Power of Attorney)   Increase amlodipine to 10 mg daily  Check the  blood pressure 2 or 3 times a week BP GOAL is between 110/65 and  130/85. Ideal BP 120/80 Call me in a month and let me know how your blood pressures are    GO TO THE LAB : Get the blood work     [place], [place] Come back for physical exam in 3 months

## 2021-01-07 NOTE — Progress Notes (Signed)
Pre visit review using our clinic review tool, if applicable. No additional management support is needed unless otherwise documented below in the visit note. 

## 2021-01-07 NOTE — Progress Notes (Signed)
Subjective:    Patient ID: [name], female    DOB: Feb 26, 1935, 86 y.o.   MRN: [id number]  DOS:  01/07/2021 Type of visit - description: Follow-up Several issues discussed HTN: Ambulatory BPs in the 140s. CKD: Note from nephrology reviewed Hypothyroidism: Good med compliance  BP Readings from Last 3 Encounters:  01/07/21 (!) 154/80  06/08/20 (!) 153/80  03/02/20 (!) 144/57     Review of Systems Denies lower extremity edema. No chest pain or difficulty breathing. Has a headache seldom  Past Medical History:  Diagnosis Date  . Abnormal MRI of abdomen    ABNORMAL CT and MRI of pancreas, last MRI 1-09: after d/w GI we rec. to f/u clinically and redo XRs if problems    . Anemia   . CRI (chronic renal insufficiency)    elevated creatinine 4-11: stage 3 CKD, sees nephrology, history of solitary   . Diverticulosis   . Glaucoma   . Gout    h/o   . Hypertension   . Hyperthyroidism    s/p ablation, Dr Debbora Presto    Past Surgical History:  Procedure Laterality Date  . CATARACT EXTRACTION W/ INTRAOCULAR LENS  IMPLANT, BILATERAL  2006  . COLONOSCOPY    . ORIF TIBIA PLATEAU  10/24/2012   Procedure: OPEN REDUCTION INTERNAL FIXATION (ORIF) TIBIAL PLATEAU;  Surgeon: Jessy Oto, MD;  Location: [facility];  Service: Orthopedics;  Laterality: Left;    Allergies as of 01/07/2021   No Known Allergies     Medication List       Accurate as of January 07, 2021 11:59 PM. If you have any questions, ask your nurse or doctor.        acetaminophen 500 MG tablet Commonly known as: TYLENOL Take 500-1,000 mg by mouth every 6 (six) hours as needed for fever or headache (pain).   amLODipine 10 MG tablet Commonly known as: NORVASC Take 1 tablet (10 mg total) by mouth daily. What changed:   medication strength  how much to take Changed by: Kathlene November, MD   ascorbic acid 500 MG tablet Commonly known as: VITAMIN C Take 1 tablet (500 mg total) by mouth daily.   latanoprost 0.005 %  ophthalmic solution Commonly known as: XALATAN Place 1 drop into both eyes at bedtime.   levothyroxine 75 MCG tablet Commonly known as: SYNTHROID Take 1 tablet (75 mcg total) by mouth daily before breakfast.   Vitamin D3 25 MCG tablet Commonly known as: Vitamin D Take 1 tablet (1,000 Units total) by mouth daily.          Objective:   Physical Exam BP (!) 154/80 (BP Location: Left Arm, Patient Position: Sitting, Cuff Size: Small)   Pulse 76   Temp 98 F (36.7 C) (Oral)   Resp 18   Ht 5\' 5"  (1.651 m)   Wt 130 lb 6 oz (59.1 kg)   SpO2 98%   BMI 21.70 kg/m  General:   Well developed, NAD, BMI noted. HEENT:  Normocephalic . Face symmetric, atraumatic Lungs:  CTA B Normal respiratory effort, no intercostal retractions, no accessory muscle use. Heart: RRR,  no murmur.  Lower extremities: no pretibial edema bilaterally  Skin: Not pale. Not jaundice Neurologic:  alert & oriented X3.  Speech normal, gait appropriate for age and unassisted Psych--  Cognition and judgment appear intact.  Cooperative with normal attention span and concentration.  Behavior appropriate. No anxious or depressed appearing.      Assessment    Assessment  HTN CKD, stage III, Single kidney (absent L one); sees nephrology prn, last visit 2014. (Creatinine 1.8 = clearance ~ 30) Hypothyroidism:  s/p ablation, Dr. Chalmers Cater, no f/u schedule  Glaucoma H/o Gout H/o abnormal MRI abdomen , last MRI 2009, saw GI ---> follow up clinically, redo MRI prn Covid pneumonia, admitted 11-2019  PLAN:  HTN: Currently on amlodipine 5 mg, ambulatory BPs in the 140s, at this office in the 150s.  Goal 120/80. Plan: Check BMP, increase amlodipine to 5 mg, watch for edema, call with readings in 4 weeks CKD Saw nephrology 07-2020, DX stage IIIb CKD, they felt failure was gradually developed and recommend to be seen yearly Hypothyroidism: On Synthroid, labs. RTC 3 months CPX   This visit occurred during the SARS-CoV-2  public health emergency.  Safety protocols were in place, including screening questions prior to the visit, additional usage of staff PPE, and extensive cleaning of exam room while observing appropriate contact time as indicated for disinfecting solutions.

## 2021-01-08 NOTE — Assessment & Plan Note (Addendum)
HTN: Currently on amlodipine 5 mg, ambulatory BPs in the 140s, at this office in the 150s.  Goal 120/80. Plan: Check BMP, increase amlodipine to 5 mg, watch for edema, call with readings in 4 weeks CKD Saw nephrology 07-2020, DX stage IIIb CKD, they felt failure was gradually developed and recommend to be seen yearly Hypothyroidism: On Synthroid, labs. RTC 3 months CPX

## 2021-01-10 NOTE — Addendum Note (Signed)
Addended byDamita Dunnings D on: 01/10/2021 10:21 AM   Modules accepted: Orders

## 2021-02-04 ENCOUNTER — Other Ambulatory Visit: Payer: Self-pay | Admitting: Internal Medicine

## 2021-02-04 DIAGNOSIS — E039 Hypothyroidism, unspecified: Secondary | ICD-10-CM

## 2021-02-21 ENCOUNTER — Other Ambulatory Visit: Payer: Self-pay

## 2021-02-21 ENCOUNTER — Other Ambulatory Visit (INDEPENDENT_AMBULATORY_CARE_PROVIDER_SITE_OTHER): Payer: Medicare HMO

## 2021-02-21 DIAGNOSIS — E039 Hypothyroidism, unspecified: Secondary | ICD-10-CM

## 2021-02-21 LAB — TSH: TSH: 0.4 u[IU]/mL (ref 0.35–4.50)

## 2021-03-24 ENCOUNTER — Emergency Department (HOSPITAL_COMMUNITY): Payer: Medicare HMO

## 2021-03-24 ENCOUNTER — Other Ambulatory Visit: Payer: Self-pay

## 2021-03-24 ENCOUNTER — Encounter (HOSPITAL_COMMUNITY): Payer: Self-pay

## 2021-03-24 ENCOUNTER — Emergency Department (HOSPITAL_COMMUNITY)
Admission: EM | Admit: 2021-03-24 | Discharge: 2021-03-24 | Disposition: A | Discharge status: Home / Self Care | Payer: Medicare HMO | Attending: Emergency Medicine | Admitting: Emergency Medicine

## 2021-03-24 DIAGNOSIS — I129 Hypertensive chronic kidney disease with stage 1 through stage 4 chronic kidney disease, or unspecified chronic kidney disease: Secondary | ICD-10-CM | POA: Diagnosis not present

## 2021-03-24 DIAGNOSIS — R0689 Other abnormalities of breathing: Secondary | ICD-10-CM | POA: Diagnosis not present

## 2021-03-24 DIAGNOSIS — E039 Hypothyroidism, unspecified: Secondary | ICD-10-CM | POA: Diagnosis not present

## 2021-03-24 DIAGNOSIS — R4020 Unspecified coma: Secondary | ICD-10-CM | POA: Diagnosis not present

## 2021-03-24 DIAGNOSIS — Z79899 Other long term (current) drug therapy: Secondary | ICD-10-CM | POA: Diagnosis not present

## 2021-03-24 DIAGNOSIS — Z8616 Personal history of COVID-19: Secondary | ICD-10-CM | POA: Insufficient documentation

## 2021-03-24 DIAGNOSIS — I10 Essential (primary) hypertension: Secondary | ICD-10-CM | POA: Diagnosis not present

## 2021-03-24 DIAGNOSIS — R479 Unspecified speech disturbances: Secondary | ICD-10-CM | POA: Diagnosis not present

## 2021-03-24 DIAGNOSIS — Z8673 Personal history of transient ischemic attack (TIA), and cerebral infarction without residual deficits: Secondary | ICD-10-CM | POA: Diagnosis not present

## 2021-03-24 DIAGNOSIS — R4182 Altered mental status, unspecified: Secondary | ICD-10-CM | POA: Diagnosis not present

## 2021-03-24 DIAGNOSIS — R55 Syncope and collapse: Secondary | ICD-10-CM | POA: Insufficient documentation

## 2021-03-24 DIAGNOSIS — N183 Chronic kidney disease, stage 3 unspecified: Secondary | ICD-10-CM | POA: Diagnosis not present

## 2021-03-24 DIAGNOSIS — I959 Hypotension, unspecified: Secondary | ICD-10-CM | POA: Diagnosis not present

## 2021-03-24 LAB — CBC WITH DIFFERENTIAL/PLATELET
Abs Immature Granulocytes: 0.02 10*3/uL (ref 0.00–0.07)
Basophils Absolute: 0 10*3/uL (ref 0.0–0.1)
Basophils Relative: 1 %
Eosinophils Absolute: 0.1 10*3/uL (ref 0.0–0.5)
Eosinophils Relative: 1 %
HCT: 31.4 % — ABNORMAL LOW (ref 36.0–46.0)
Hemoglobin: 10 g/dL — ABNORMAL LOW (ref 12.0–15.0)
Immature Granulocytes: 0 %
Lymphocytes Relative: 27 %
Lymphs Abs: 1.9 10*3/uL (ref 0.7–4.0)
MCH: 28.4 pg (ref 26.0–34.0)
MCHC: 31.8 g/dL (ref 30.0–36.0)
MCV: 89.2 fL (ref 80.0–100.0)
Monocytes Absolute: 0.6 10*3/uL (ref 0.1–1.0)
Monocytes Relative: 9 %
Neutro Abs: 4.3 10*3/uL (ref 1.7–7.7)
Neutrophils Relative %: 62 %
Platelets: 349 10*3/uL (ref 150–400)
RBC: 3.52 MIL/uL — ABNORMAL LOW (ref 3.87–5.11)
RDW: 16.9 % — ABNORMAL HIGH (ref 11.5–15.5)
WBC: 6.9 10*3/uL (ref 4.0–10.5)
nRBC: 0 % (ref 0.0–0.2)

## 2021-03-24 LAB — BASIC METABOLIC PANEL
Anion gap: 6 (ref 5–15)
BUN: 27 mg/dL — ABNORMAL HIGH (ref 8–23)
CO2: 23 mmol/L (ref 22–32)
Calcium: 9.6 mg/dL (ref 8.9–10.3)
Chloride: 113 mmol/L — ABNORMAL HIGH (ref 98–111)
Creatinine, Ser: 2.1 mg/dL — ABNORMAL HIGH (ref 0.44–1.00)
GFR, Estimated: 23 mL/min — ABNORMAL LOW (ref >60)
Glucose, Bld: 134 mg/dL — ABNORMAL HIGH (ref 70–99)
Potassium: 3.8 mmol/L (ref 3.5–5.1)
Sodium: 142 mmol/L (ref 135–145)

## 2021-03-24 LAB — URINALYSIS, ROUTINE W REFLEX MICROSCOPIC
Bilirubin Urine: NEGATIVE
Glucose, UA: NEGATIVE mg/dL
Hgb urine dipstick: NEGATIVE
Ketones, ur: NEGATIVE mg/dL
Nitrite: NEGATIVE
Protein, ur: 100 mg/dL — AB
Specific Gravity, Urine: 1.013 (ref 1.005–1.030)
pH: 5 (ref 5.0–8.0)

## 2021-03-24 LAB — TROPONIN I (HIGH SENSITIVITY)
Troponin I (High Sensitivity): 6 ng/L (ref <18)
Troponin I (High Sensitivity): 7 ng/L (ref <18)

## 2021-03-24 MED ORDER — SODIUM CHLORIDE 0.9 % IV BOLUS
1000.0000 mL | Freq: Once | INTRAVENOUS | Status: AC
  Administered 2021-03-24: 1000 mL via INTRAVENOUS

## 2021-03-24 NOTE — ED Notes (Signed)
Provider at the bedside to evaluate. 

## 2021-03-24 NOTE — ED Provider Notes (Signed)
[facility] DEPT Provider Note   CSN: [id number] Arrival date & time: 03/24/21  1532     History Chief Complaint  Patient presents with  . Loss of Consciousness    [name] is a 86 y.o. female with a history of hypertension, hypothyroidism, presenting to emergency department with an episode of unresponsiveness.  Patient reports she was in usual state of health today.  She was in her house sitting on the couch.  She does not remember what happened afterwards.  She recalls waking up with her sister telling her she had called an ambulance.  She feels back to normal.  The patient denies headache, blurred vision, chest pain, palpitations, shortness of breath.  She says she had 1 similar episode 3 months ago.  She normally is quite active and can ambulate without shortness of breath or any difficulties.  She denies any history of MI or cardiac disease or stent.  She denies any history of DVT or PE.  She denies any recent fevers, chills, cough, congestion.  She denies any history of seizures.  Her sister Angelita Ingles by phone tells me "The patient walked into the house and I came in behind her, and she was on the chair, looking spaced out, taking deep, shallow breaths.  She wouldn't answer when I called her name.  After about a minute, she started moving her hands slowly and gradually started coming around."  Similar episode occurred 1-2 months ago, witnessed by patient's daughter, likewise lasting a few seconds or minutes while at rest.    HPI     Past Medical History:  Diagnosis Date  . Abnormal MRI of abdomen    ABNORMAL CT and MRI of pancreas, last MRI 1-09: after d/w GI we rec. to f/u clinically and redo XRs if problems    . Anemia   . CRI (chronic renal insufficiency)    elevated creatinine 4-11: stage 3 CKD, sees nephrology, history of solitary   . Diverticulosis   . Glaucoma   . Gout    h/o   . Hypertension   . Hyperthyroidism    s/p ablation, Dr  Debbora Presto    Patient Active Problem List   Diagnosis Date Noted  . AKI (acute kidney injury) ([facility]) 12/04/2019  . Acute respiratory disease due to COVID-19 virus 12/04/2019  . Pneumonia due to COVID-19 virus 12/04/2019  . PCP NOTES >>>>>>>>>>>>>>>>>>>>>> 04/01/2016  . Gout 02/06/2014  . Glaucoma 12/07/2012  . Annual physical exam 08/08/2011  . CKD (chronic kidney disease) stage 3, GFR 30-59 ml/min ([facility]) 09/23/2010  . Hypothyroidism 06/13/2008  . DIVERTICULOSIS, COLON 01/27/2008  . Anemia 08/03/2007  . Abnormal MRI of abdomen 08/03/2007  . Essential hypertension 05/31/2007    Past Surgical History:  Procedure Laterality Date  . CATARACT EXTRACTION W/ INTRAOCULAR LENS  IMPLANT, BILATERAL  2006  . COLONOSCOPY    . ORIF TIBIA PLATEAU  10/24/2012   Procedure: OPEN REDUCTION INTERNAL FIXATION (ORIF) TIBIAL PLATEAU;  Surgeon: Jessy Oto, MD;  Location: [facility];  Service: Orthopedics;  Laterality: Left;     OB History   No obstetric history on file.     Family History  Problem Relation Age of Onset  . Heart attack Sister   . Colon cancer Sister 42  . Cancer Sister   . Heart attack Brother   . Pancreatic cancer Brother   . Diabetes Mother   . Esophageal cancer Neg Hx   . Stomach cancer Neg Hx   .  Rectal cancer Neg Hx   . Breast cancer Neg Hx     Social History   Tobacco Use  . Smoking status: Never Smoker  . Smokeless tobacco: Never Used  Substance Use Topics  . Alcohol use: No  . Drug use: No    Home Medications Prior to Admission medications   Medication Sig Start Date End Date Taking? Authorizing Provider  acetaminophen (TYLENOL) 500 MG tablet Take 500-1,000 mg by mouth every 6 (six) hours as needed for fever or headache (pain).    [provider]  amLODipine (NORVASC) 10 MG tablet Take 1 tablet (10 mg total) by mouth daily. 01/07/21   Colon Branch, MD  ascorbic acid (VITAMIN C) 500 MG tablet Take 1 tablet (500 mg total) by mouth daily. 12/07/19   Kayleen Memos, DO  cholecalciferol (VITAMIN D) 25 MCG tablet Take 1 tablet (1,000 Units total) by mouth daily. 12/07/19   Kayleen Memos, DO  latanoprost (XALATAN) 0.005 % ophthalmic solution Place 1 drop into both eyes at bedtime.     [provider]  levothyroxine (SYNTHROID) 75 MCG tablet Take 1 tablet by mouth daily for 6 days of the week (skip 1 day) 02/04/21   Colon Branch, MD    Allergies    Patient has no known allergies.  Review of Systems   Review of Systems  Constitutional: Negative for chills and fever.  HENT: Negative for ear pain and sore throat.   Eyes: Negative for pain and visual disturbance.  Respiratory: Negative for cough and shortness of breath.   Cardiovascular: Negative for chest pain and palpitations.  Gastrointestinal: Negative for abdominal pain and vomiting.  Genitourinary: Negative for dysuria and hematuria.  Musculoskeletal: Negative for arthralgias and back pain.  Skin: Negative for color change and rash.  Neurological: Positive for speech difficulty and light-headedness. Negative for weakness.  All other systems reviewed and are negative.   Physical Exam Updated Vital Signs BP (!) 140/59   Pulse 82   Temp 97.6 F (36.4 C) (Oral)   Resp 20   Ht 5\' 5"  (1.651 m)   Wt 59.1 kg   SpO2 100%   BMI 21.68 kg/m   Physical Exam Constitutional:      General: She is not in acute distress. HENT:     Head: Normocephalic and atraumatic.  Eyes:     Conjunctiva/sclera: Conjunctivae normal.     Pupils: Pupils are equal, round, and reactive to light.  Cardiovascular:     Rate and Rhythm: Normal rate and regular rhythm.  Pulmonary:     Effort: Pulmonary effort is normal. No respiratory distress.  Abdominal:     General: There is no distension.     Tenderness: There is no abdominal tenderness.  Skin:    General: Skin is warm and dry.  Neurological:     General: No focal deficit present.     Mental Status: She is alert and oriented to person, place, and  time. Mental status is at baseline.     Sensory: No sensory deficit.     Motor: No weakness.  Psychiatric:        Mood and Affect: Mood normal.        Behavior: Behavior normal.     ED Results / Procedures / Treatments   Labs (all labs ordered are listed, but only abnormal results are displayed) Labs Reviewed  BASIC METABOLIC PANEL - Abnormal; Notable for the following components:      Result Value  Chloride 113 (*)    Glucose, Bld 134 (*)    BUN 27 (*)    Creatinine, Ser 2.10 (*)    GFR, Estimated 23 (*)    All other components within normal limits  CBC WITH DIFFERENTIAL/PLATELET - Abnormal; Notable for the following components:   RBC 3.52 (*)    Hemoglobin 10.0 (*)    HCT 31.4 (*)    RDW 16.9 (*)    All other components within normal limits  URINALYSIS, ROUTINE W REFLEX MICROSCOPIC - Abnormal; Notable for the following components:   APPearance HAZY (*)    Protein, ur 100 (*)    Leukocytes,Ua LARGE (*)    Bacteria, UA RARE (*)    Non Squamous Epithelial 0-5 (*)    All other components within normal limits  TROPONIN I (HIGH SENSITIVITY)  TROPONIN I (HIGH SENSITIVITY)    EKG EKG Interpretation  Date/Time:  Sunday Mar 24 2021 15:48:38 EDT Ventricular Rate:  73 PR Interval:  146 QRS Duration: 86 QT Interval:  416 QTC Calculation: 459 R Axis:   63 Text Interpretation: Sinus rhythm Abnormal R-wave progression, early transition Confirmed by Octaviano Glow [phone]) on 03/24/2021 4:00:38 PM   Radiology CT Head Wo Contrast  Result Date: 03/24/2021 CLINICAL DATA:  Mental status change EXAM: CT HEAD WITHOUT CONTRAST TECHNIQUE: Contiguous axial images were obtained from the base of the skull through the vertex without intravenous contrast. COMPARISON:  2018 FINDINGS: Brain: There is no acute intracranial hemorrhage, mass effect, or edema. Gray-white differentiation is preserved. There is no extra-axial fluid collection. Patchy and confluent areas of hypoattenuation in the  supratentorial white matter are nonspecific but probably reflect moderate chronic microvascular ischemic changes. Prominence of the ventricles and sulci reflects minor generalized parenchymal volume loss. Vascular: There is atherosclerotic calcification at the skull base. Skull: Calvarium is unremarkable. Sinuses/Orbits: No acute finding. Other: None. IMPRESSION: No acute intracranial hemorrhage, edema, or evidence of acute infarction. Chronic microvascular ischemic changes. Electronically Signed   By: Macy Mis M.D.   On: 03/24/2021 16:25   MR BRAIN WO CONTRAST  Result Date: 03/24/2021 CLINICAL DATA:  Transient ischemic attack.  Speech disturbance. EXAM: MRI HEAD WITHOUT CONTRAST TECHNIQUE: Multiplanar, multiecho pulse sequences of the brain and surrounding structures were obtained without intravenous contrast. COMPARISON:  Head CT same day. FINDINGS: Brain: Diffusion imaging does not show any acute or subacute infarction. No abnormality is seen affecting the brainstem or cerebellum. Cerebral hemispheres show moderate chronic small-vessel ischemic changes of the thalami and white matter. No cortical or large vessel territory infarction. No mass lesion, hemorrhage, hydrocephalus or extra-axial collection. Vascular: Major vessels at the base of the brain show flow. Skull and upper cervical spine: Negative Sinuses/Orbits: Clear/normal Other: None IMPRESSION: No acute finding. Moderate chronic small-vessel ischemic changes affecting the thalami and hemispheric white matter. Electronically Signed   By: Nelson Chimes M.D.   On: 03/24/2021 18:35   DG Chest Portable 1 View  Result Date: 03/24/2021 CLINICAL DATA:  Found unresponsive today. EXAM: PORTABLE CHEST 1 VIEW COMPARISON:  Chest x-rays dated 01/18/2020 and 12/03/2019 FINDINGS: Heart size and mediastinal contours are stable. Lungs are clear. No pleural effusion or pneumothorax is seen. Stable scoliosis of the thoracolumbar spine. No acute appearing osseous  abnormality. IMPRESSION: No active disease. No evidence of pneumonia or pulmonary edema. Electronically Signed   By: Franki Cabot M.D.   On: 03/24/2021 16:31    Procedures Procedures   Medications Ordered in ED Medications  sodium chloride 0.9 % bolus  1,000 mL (0 mLs Intravenous Stopped 03/24/21 1940)    ED Course  I have reviewed the triage vital signs and the nursing notes.  Pertinent labs & imaging results that were available during my care of the patient were reviewed by me and considered in my medical decision making (see chart for details).  This patient presents to the Emergency Department with complaint of near syncope / unresponsiveness at home today. This involves an extensive number of treatment options, and is a complaint that carries with it a high risk of complications and morbidity.  The differential diagnosis includes hypoglycemia vs metabolic encephalopathy vs infection (including cystitis) vs ICH vs stroke vs polypharmacy vs arrhythmia other  I ordered, reviewed, and interpreted labs, including BMP, UA, CBC near baseline - UA negative for nitrites, unlikely UTI.  Minor increase in BUN/Cr likely 2/2 poor PO intake.  Trop 7. I ordered medication IV fluids for hydration I ordered imaging studies which included CTH, DG chest, MRI brain  I independently visualized and interpreted imaging which showed no acute infarct and the monitor tracing which showed NSR Additional history was obtained from patient's sister by phone and her daughter in the room I personally reviewed the patients ECG which showed sinus rhythm with no acute ischemic findings   Clinical Course as of 03/24/21 2310  Sun Mar 24, 2021  1559 Temp normal per nursing [MT]  1723 Pt remains asymptomatic.  Discussed MRI with her and daughter to rule out TIA as a possibility.  They agree with plan. [MT]  5465  IMPRESSION: No acute finding. Moderate chronic small-vessel ischemic changes affecting the thalami and  hemispheric white matter. [MT]  6812 Reviewed MRI findings with the patient and her daughter. [MT]  1848 Work-up has been unremarkable.  I will refer her to cardiology, as it is possible she is experiencing an intermittent arrhythmia at home.  However, at this time I do believe she is reasonably safe for discharge home.  She does live with a family member who can keep an eye on her.  Her tele has been normal in the ED today. [MT]    Clinical Course User Index [MT] Kristalynn Coddington, Carola Rhine, MD    Final Clinical Impression(s) / ED Diagnoses Final diagnoses:  Near syncope    Rx / DC Orders ED Discharge Orders         Ordered    Ambulatory referral to Cardiology       Comments: Near syncope - may need echocardiogram or zio monitor   03/24/21 1854           Wyvonnia Dusky, MD 03/24/21 2310

## 2021-03-24 NOTE — ED Triage Notes (Signed)
Pt presents to the ED via EMS form home. Per EMS, pt lives at home with her sister and after a cook-out today and not drinking enough water, the pt went home and was noticed by her sister to be unresponsive to voice while sitting on her couch. Per EMS, the pt had a similar episode of this around 48months ago however she was not evaluated at that time. Pt has a hx of HTN and hypothyroidism.

## 2021-03-24 NOTE — Discharge Instructions (Signed)
Your MRI did not show signs of a stroke today.  We did talk about your dehydration.  Please make an effort to drink more water at home.  I also want you to be seen by a cardiologist for these spells.  You may need more of a work-up done for your heart.  If you do not hear from their office within 2 business days, please call the number above and ask for an appointment.  If you begin having chest pain, lightheadedness, loss of consciousness at home, please call 911 return back to the ER.

## 2021-03-26 ENCOUNTER — Ambulatory Visit (INDEPENDENT_AMBULATORY_CARE_PROVIDER_SITE_OTHER): Payer: Medicare HMO | Admitting: Internal Medicine

## 2021-03-26 ENCOUNTER — Encounter: Payer: Self-pay | Admitting: Internal Medicine

## 2021-03-26 ENCOUNTER — Other Ambulatory Visit: Payer: Self-pay

## 2021-03-26 VITALS — BP 142/78 | HR 83 | Temp 98.1°F | Resp 16 | Ht 65.0 in | Wt 124.4 lb

## 2021-03-26 DIAGNOSIS — N1832 Chronic kidney disease, stage 3b: Secondary | ICD-10-CM | POA: Diagnosis not present

## 2021-03-26 DIAGNOSIS — R55 Syncope and collapse: Secondary | ICD-10-CM | POA: Diagnosis not present

## 2021-03-26 DIAGNOSIS — I10 Essential (primary) hypertension: Secondary | ICD-10-CM | POA: Diagnosis not present

## 2021-03-26 NOTE — Patient Instructions (Signed)
Check the  blood pressure as you are doing   BP GOAL is between 110/65 and  135/85. If it is consistently higher or lower, let me know      GO TO THE FRONT DESK, PLEASE SCHEDULE YOUR APPOINTMENTS Come back for  A check up in 3 months

## 2021-03-26 NOTE — Assessment & Plan Note (Signed)
Syncope: As described above, similar symptoms 6 weeks ago but she did not seek medical attention. Exam today is nonfocal. DDx is large but includes TIA, bradycardia or tachyarrhythmia, seizure, others. Will refer to cardiology,  Arrhythmia? She may benefit from a echo, not sure if a Holter monitor will help, perhaps a Zio patch will be better. In the meantime recommend to avoid driving, keep  hydrated.  Call if further problems HTN: BP today satisfactory, on amlodipine CKD: Creatinine minimally up from baseline.  Rec drink plenty of fluids RTC 3 months

## 2021-03-26 NOTE — Progress Notes (Signed)
Subjective:    Patient ID: [name], female    DOB: 03-Dec-1934, 86 y.o.   MRN: [id number]  DOS:  03/26/2021 Type of visit - description: ED follow-up  Went to the ER 2 days ago: Chart is reviewed. The patient tells me that that day she worked few hours helping on a cookout at [employer]. She came back home, sat down and the next thing she knew, her sister was trying to wake her up.    The sister found her sitting in a chair unresponsive, staring. Apparently the episode lasted ~ couple of minutes. Similar symptoms happened 6 weeks ago but she did not seek medical attention then. With this concern she went to the ER, work-up:  Creatinine 2.1, slightly higher than before, potassium normal. Hemoglobin 10.0, at baseline, Troponin negative Chest x-ray nonacute CT head no acute MRI brain: Moderate chronic small vessel ischemic changes.  No acute    Review of Systems Since she left the ER, feels well.  She specifically denies chest pain, lower extremity edema, shortness of breath.  No palpitations No nausea vomiting.  No blood in the stools No bladder or bowel incontinence No seizure activity No headaches, diplopia, slurred speech.   Past Medical History:  Diagnosis Date  . Abnormal MRI of abdomen    ABNORMAL CT and MRI of pancreas, last MRI 1-09: after d/w GI we rec. to f/u clinically and redo XRs if problems    . Anemia   . CRI (chronic renal insufficiency)    elevated creatinine 4-11: stage 3 CKD, sees nephrology, history of solitary   . Diverticulosis   . Glaucoma   . Gout    h/o   . Hypertension   . Hyperthyroidism    s/p ablation, Dr Debbora Presto    Past Surgical History:  Procedure Laterality Date  . CATARACT EXTRACTION W/ INTRAOCULAR LENS  IMPLANT, BILATERAL  2006  . COLONOSCOPY    . ORIF TIBIA PLATEAU  10/24/2012   Procedure: OPEN REDUCTION INTERNAL FIXATION (ORIF) TIBIAL PLATEAU;  Surgeon: Jessy Oto, MD;  Location: [facility];  Service: Orthopedics;  Laterality:  Left;    Allergies as of 03/26/2021   No Known Allergies     Medication List       Accurate as of Mar 26, 2021  6:23 PM. If you have any questions, ask your nurse or doctor.        acetaminophen 500 MG tablet Commonly known as: TYLENOL Take 500-1,000 mg by mouth every 6 (six) hours as needed for fever or headache (pain).   amLODipine 10 MG tablet Commonly known as: NORVASC Take 1 tablet (10 mg total) by mouth daily.   ascorbic acid 500 MG tablet Commonly known as: VITAMIN C Take 1 tablet (500 mg total) by mouth daily.   latanoprost 0.005 % ophthalmic solution Commonly known as: XALATAN Place 1 drop into both eyes at bedtime.   levothyroxine 75 MCG tablet Commonly known as: SYNTHROID Take 1 tablet by mouth daily for 6 days of the week (skip 1 day)   Vitamin D3 25 MCG tablet Commonly known as: Vitamin D Take 1 tablet (1,000 Units total) by mouth daily.          Objective:   Physical Exam BP (!) 142/78 (BP Location: Left Arm, Patient Position: Sitting, Cuff Size: Small)   Pulse 83   Temp 98.1 F (36.7 C) (Oral)   Resp 16   Ht 5\' 5"  (1.651 m)   Wt 124 lb 6  oz (56.4 kg)   SpO2 96%   BMI 20.70 kg/m  General:   Well developed, NAD, BMI noted. HEENT:  Normocephalic . Face symmetric, atraumatic. Neck: Normal carotid arteries Lungs:  CTA B Normal respiratory effort, no intercostal retractions, no accessory muscle use. Heart: RRR,  no murmur.  Lower extremities: no pretibial edema bilaterally  Skin: Not pale. Not jaundice Neurologic:  alert & oriented X3.  Speech normal, gait appropriate for age and unassisted EOMI, pupils equal, reactive. Psych--  Cognition and judgment appear intact.  Cooperative with normal attention span and concentration.  Behavior appropriate. No anxious or depressed appearing.      Assessment     Assessment HTN CKD, stage III, Single kidney (absent L one); sees nephrology prn, last visit 2014. (Creatinine 1.8 = clearance ~  30) Hypothyroidism:  s/p ablation, Dr. Chalmers Cater, no f/u schedule  Glaucoma H/o Gout H/o abnormal MRI abdomen , last MRI 2009, saw GI ---> follow up clinically, redo MRI prn Covid pneumonia, admitted 11-2019  PLAN:  Syncope: As described above, similar symptoms 6 weeks ago but she did not seek medical attention. Exam today is nonfocal. DDx is large but includes TIA, bradycardia or tachyarrhythmia, seizure, others. Will refer to cardiology,  Arrhythmia? She may benefit from a echo, not sure if a Holter monitor will help, perhaps a Zio patch will be better. In the meantime recommend to avoid driving, keep  hydrated.  Call if further problems HTN: BP today satisfactory, on amlodipine CKD: Creatinine minimally up from baseline.  Rec drink plenty of fluids RTC 3 months  Time spent: 30 minutes.  Reviewed extensive data from the ER visit, explaining her why I think a cardiology evaluation is warranted.  Questions answered to the best of my ability.     This visit occurred during the SARS-CoV-2 public health emergency.  Safety protocols were in place, including screening questions prior to the visit, additional usage of staff PPE, and extensive cleaning of exam room while observing appropriate contact time as indicated for disinfecting solutions.

## 2021-04-08 ENCOUNTER — Encounter: Payer: Medicare HMO | Admitting: Internal Medicine

## 2021-04-10 ENCOUNTER — Other Ambulatory Visit: Payer: Self-pay | Admitting: Internal Medicine

## 2021-04-10 DIAGNOSIS — E039 Hypothyroidism, unspecified: Secondary | ICD-10-CM

## 2021-04-29 DIAGNOSIS — E0590 Thyrotoxicosis, unspecified without thyrotoxic crisis or storm: Secondary | ICD-10-CM | POA: Insufficient documentation

## 2021-04-29 DIAGNOSIS — K5790 Diverticulosis of intestine, part unspecified, without perforation or abscess without bleeding: Secondary | ICD-10-CM | POA: Insufficient documentation

## 2021-04-29 DIAGNOSIS — N189 Chronic kidney disease, unspecified: Secondary | ICD-10-CM | POA: Insufficient documentation

## 2021-04-29 DIAGNOSIS — I10 Essential (primary) hypertension: Secondary | ICD-10-CM | POA: Insufficient documentation

## 2021-05-02 NOTE — Progress Notes (Signed)
Cardiology Office Note:    Date:  05/03/2021   ID:  [name], DOB [birth year], MRN [id number]  PCP:  Colon Branch, MD  Cardiologist:  Shirlee More, MD   Referring MD: Colon Branch, MD  ASSESSMENT:    1. Syncope and collapse   2. Hypertensive heart and chronic kidney disease with heart failure and stage 1 through stage 4 chronic kidney disease, or chronic kidney disease ([place])    PLAN:    In order of problems listed above:  She has had 2 episodes of nonpositional brief syncope 1 with awareness that she was going to faint and witnessed.  Her daughter checks blood pressures at home she has no orthostatic shift she has not in my office today and the concern is she has intermittent bradycardia.  I have asked her to use a live Zio monitor for 2 weeks and if ineffective I would not consider a loop recorder unless she had another episode.  I pay close attention that she gets adequate hydration during the day.  They asked my opinion if she can drive a car and I think if this monitor is unremarkable she can. Stable BP at target continue current treatment taking a Yu-acting calcium channel blocker without rate slowing effect.  Next appointment 6 weeks   Medication Adjustments/Labs and Tests Ordered: Current medicines are reviewed at length with the patient today.  Concerns regarding medicines are outlined above.  No orders of the defined types were placed in this encounter.  No orders of the defined types were placed in this encounter.    Chief Complaint  Patient presents with   Loss of Consciousness    History of Present Illness:    [name] is a 86 y.o. female with a history of hypertension chronic kidney disease and hypothyroidism being seen today for the evaluation of syncope at the request of Colon Branch, MD. She was evaluated in the emergency room at [facility] 03/24/2021 after an episode of unresponsiveness and syncope.  She had a previous episode several months ago.   Her evaluation showed an EKG with sinus rhythm abnormal R wave progression early transition hemoglobin is mildly diminished 10.0 GFR was reduced stage IV 23 cc creatinine 2.1 and high-sensitivity troponins were normal and there was no arrhythmia noted in the emergency room.  She also had an MRI of the brain with no moderate chronic small vessel ischemic changes and no acute findings. She was admitted to [facility] January 2021 with COVID-19 pneumonia.  Her daughter is present participates in evaluation and decision making She has had 2 episodes of syncope. The first was witnessed by the daughter about 3 months ago and she slumped and briefly lost consciousness.  She had an awareness that she was going to faint she had no ictal activity and afterwards no confusion or urinary incontinence and quickly recovered. The recent episode occurred after a [place] day at church in the heat without eating and drinking.  She was sitting on a chair and just slumped over it was brief she recovered again no incontinence or ictal activity and she was not postictal afterwards. She has had no recurrence. She has no history of heart disease congenital rheumatic or atrial fibrillation she takes no rate slowing medications she does not have orthostatic lightheadedness and she has no history of seizure disorder.  She is taking no medications with CNS side effects. Past Medical History:  Diagnosis Date   Abnormal MRI of abdomen  ABNORMAL CT and MRI of pancreas, last MRI 1-09: after d/w GI we rec. to f/u clinically and redo XRs if problems     Anemia    CRI (chronic renal insufficiency)    elevated creatinine 4-11: stage 3 CKD, sees nephrology, history of solitary    Diverticulosis    Glaucoma    Gout    h/o    Hypertension    Hyperthyroidism    s/p ablation, Dr Debbora Presto    Past Surgical History:  Procedure Laterality Date   CATARACT EXTRACTION W/ INTRAOCULAR LENS  IMPLANT, BILATERAL  2006   COLONOSCOPY      ORIF TIBIA PLATEAU  10/24/2012   Procedure: OPEN REDUCTION INTERNAL FIXATION (ORIF) TIBIAL PLATEAU;  Surgeon: Jessy Oto, MD;  Location: [facility];  Service: Orthopedics;  Laterality: Left;    Current Medications: Current Meds  Medication Sig   acetaminophen (TYLENOL) 500 MG tablet Take 500-1,000 mg by mouth every 6 (six) hours as needed for fever or headache (pain).   amLODipine (NORVASC) 10 MG tablet Take 1 tablet (10 mg total) by mouth daily.   ascorbic acid (VITAMIN C) 500 MG tablet Take 1 tablet (500 mg total) by mouth daily.   cholecalciferol (VITAMIN D) 25 MCG tablet Take 1 tablet (1,000 Units total) by mouth daily.   levothyroxine (SYNTHROID) 75 MCG tablet TAKE 1 TABLET EVERY DAY FOR 6 DAYS OF THE WEEK (SKIP 1 DAY)     Allergies:   Patient has no known allergies.   Social History   Socioeconomic History   Marital status: Widowed    Spouse name: Not on file   Number of children: 1   Years of education: Not on file   Highest education level: Not on file  Occupational History   Occupation: retired , Scientist, water quality   Tobacco Use   Smoking status: Never   Smokeless tobacco: Never  Substance and Sexual Activity   Alcohol use: No   Drug use: No   Sexual activity: Not on file  Other Topics Concern   Not on file  Social History Narrative   Divorced, used to live with her sister, died ~ 2019/10/01     Independent, drives    Household: pt and a nephew   Has one daughter and another sister who lives here in town   Social Determinants of Radio broadcast assistant Strain: Not on file  Food Insecurity: Not on file  Transportation Needs: Not on file  Physical Activity: Not on file  Stress: Not on file  Social Connections: Not on file     Family History: The patient's family history includes Cancer in her sister; Colon cancer (age of onset: 17) in her sister; Diabetes in her mother; Heart attack in her brother and sister; Pancreatic cancer in her brother. There is no history of  Esophageal cancer, Stomach cancer, Rectal cancer, or Breast cancer.  ROS:   ROS Please see the history of present illness.     All other systems reviewed and are negative.  EKGs/Labs/Other Studies Reviewed:    The following studies were reviewed today:   EKG:  EKG from 03/25/2021 independently reviewed sinus rhythm normal EKG normal conduction intervals no bundle branch block QRS morphology in my opinion this EKG is normal Recent Labs: 02/21/2021: TSH 0.40 03/24/2021: BUN 27; Creatinine, Ser 2.10; Hemoglobin 10.0; Platelets 349; Potassium 3.8; Sodium 142  Recent Lipid Panel    Component Value Date/Time   CHOL 118 01/20/2019 1210   TRIG 60.0 01/20/2019  1210   HDL 61.80 01/20/2019 1210   CHOLHDL 2 01/20/2019 1210   VLDL 12.0 01/20/2019 1210   LDLCALC 44 01/20/2019 1210    Physical Exam:    VS:  BP (!) 144/72 (BP Location: Left Arm, Patient Position: Sitting, Cuff Size: Normal)   Pulse 98   Ht 5\' 5"  (1.651 m)   Wt 130 lb (59 kg)   SpO2 96%   BMI 21.63 kg/m     Wt Readings from Last 3 Encounters:  05/03/21 130 lb (59 kg)  03/26/21 124 lb 6 oz (56.4 kg)  03/24/21 130 lb 4.7 oz (59.1 kg)     GEN: She looks her age well nourished, well developed in no acute distress HEENT: Normal NECK: No JVD; No carotid bruits LYMPHATICS: No lymphadenopathy CARDIAC: RRR, no murmurs, rubs, gallops RESPIRATORY:  Clear to auscultation without rales, wheezing or rhonchi  ABDOMEN: Soft, non-tender, non-distended MUSCULOSKELETAL:  No edema; No deformity  SKIN: Warm and dry NEUROLOGIC:  Alert and oriented x 3 PSYCHIATRIC:  Normal affect     Signed, Shirlee More, MD  05/03/2021 3:04 PM    [facility]

## 2021-05-03 ENCOUNTER — Other Ambulatory Visit: Payer: Self-pay

## 2021-05-03 ENCOUNTER — Ambulatory Visit (INDEPENDENT_AMBULATORY_CARE_PROVIDER_SITE_OTHER): Payer: Medicare HMO

## 2021-05-03 ENCOUNTER — Ambulatory Visit (INDEPENDENT_AMBULATORY_CARE_PROVIDER_SITE_OTHER): Payer: Medicare HMO | Admitting: Cardiology

## 2021-05-03 ENCOUNTER — Encounter: Payer: Self-pay | Admitting: Cardiology

## 2021-05-03 VITALS — BP 144/72 | HR 98 | Ht 65.0 in | Wt 130.0 lb

## 2021-05-03 DIAGNOSIS — I130 Hypertensive heart and chronic kidney disease with heart failure and stage 1 through stage 4 chronic kidney disease, or unspecified chronic kidney disease: Secondary | ICD-10-CM | POA: Diagnosis not present

## 2021-05-03 DIAGNOSIS — E039 Hypothyroidism, unspecified: Secondary | ICD-10-CM | POA: Diagnosis not present

## 2021-05-03 DIAGNOSIS — R55 Syncope and collapse: Secondary | ICD-10-CM

## 2021-05-03 NOTE — Patient Instructions (Signed)
Medication Instructions:  Your physician recommends that you continue on your current medications as directed. Please refer to the Current Medication list given to you today.  *If you need a refill on your cardiac medications before your next appointment, please call your pharmacy*   Lab Work: None If you have labs (blood work) drawn today and your tests are completely normal, you will receive your results only by: . MyChart Message (if you have MyChart) OR . A paper copy in the mail If you have any lab test that is abnormal or we need to change your treatment, we will call you to review the results.   Testing/Procedures: A zio monitor was ordered today. It will remain on for 14 days. You will then return monitor and event diary in provided box. It takes 1-2 weeks for report to be downloaded and returned to us. We will call you with the results. If monitor falls off or has orange flashing light, please call Zio for further instructions.      Follow-Up: At [facility], you and your health needs are our priority.  As part of our continuing mission to provide you with exceptional heart care, we have created designated Provider Care Teams.  These Care Teams include your primary Cardiologist (physician) and Advanced Practice Providers (APPs -  Physician Assistants and Nurse Practitioners) who all work together to provide you with the care you need, when you need it.  We recommend signing up for the patient portal called "MyChart".  Sign up information is provided on this After Visit Summary.  MyChart is used to connect with patients for Virtual Visits (Telemedicine).  Patients are able to view lab/test results, encounter notes, upcoming appointments, etc.  Non-urgent messages can be sent to your provider as well.   To learn more about what you can do with MyChart, go to [email or web address].    Your next appointment:   6 week(s)  The format for your next appointment:   In  Person  Provider:   Brian Munley, MD   Other Instructions   

## 2021-05-04 DIAGNOSIS — R55 Syncope and collapse: Secondary | ICD-10-CM | POA: Diagnosis not present

## 2021-05-27 ENCOUNTER — Other Ambulatory Visit: Payer: Self-pay | Admitting: Internal Medicine

## 2021-05-30 ENCOUNTER — Encounter: Payer: Self-pay | Admitting: Cardiology

## 2021-05-30 ENCOUNTER — Other Ambulatory Visit: Payer: Self-pay

## 2021-05-30 ENCOUNTER — Ambulatory Visit (INDEPENDENT_AMBULATORY_CARE_PROVIDER_SITE_OTHER): Payer: Medicare HMO | Admitting: Cardiology

## 2021-06-17 ENCOUNTER — Telehealth: Payer: Self-pay

## 2021-06-17 NOTE — Telephone Encounter (Signed)
-----   Message from Richardo Priest, MD sent at 06/16/2021 12:35 PM EDT ----- Normal or stable result  Good result on monitor there were no slow episodes or pauses in heartbeat.

## 2021-06-17 NOTE — Telephone Encounter (Signed)
Spoke with patient regarding results and recommendation.  Patient verbalizes understanding and is agreeable to plan of care. Advised patient to call back with any issues or concerns.  

## 2021-06-26 ENCOUNTER — Ambulatory Visit (INDEPENDENT_AMBULATORY_CARE_PROVIDER_SITE_OTHER): Payer: Medicare HMO | Admitting: Internal Medicine

## 2021-06-26 ENCOUNTER — Other Ambulatory Visit: Payer: Self-pay

## 2021-06-26 ENCOUNTER — Encounter: Payer: Self-pay | Admitting: Internal Medicine

## 2021-06-26 VITALS — BP 122/70 | HR 89 | Temp 97.9°F | Resp 16 | Ht 65.0 in | Wt 129.1 lb

## 2021-06-26 DIAGNOSIS — I10 Essential (primary) hypertension: Secondary | ICD-10-CM

## 2021-06-26 DIAGNOSIS — N1832 Chronic kidney disease, stage 3b: Secondary | ICD-10-CM | POA: Diagnosis not present

## 2021-06-26 DIAGNOSIS — E039 Hypothyroidism, unspecified: Secondary | ICD-10-CM

## 2021-06-26 DIAGNOSIS — R55 Syncope and collapse: Secondary | ICD-10-CM | POA: Diagnosis not present

## 2021-06-26 LAB — BASIC METABOLIC PANEL
BUN: 37 mg/dL — ABNORMAL HIGH (ref 6–23)
CO2: 22 mEq/L (ref 19–32)
Calcium: 9.8 mg/dL (ref 8.4–10.5)
Chloride: 106 mEq/L (ref 96–112)
Creatinine, Ser: 2.02 mg/dL — ABNORMAL HIGH (ref 0.40–1.20)
GFR: 22.29 mL/min — ABNORMAL LOW (ref >60.00)
Glucose, Bld: 86 mg/dL (ref 70–99)
Potassium: 5 mEq/L (ref 3.5–5.1)
Sodium: 139 mEq/L (ref 135–145)

## 2021-06-26 LAB — LIPID PANEL
Cholesterol: 146 mg/dL (ref 0–200)
HDL: 76.3 mg/dL (ref >39.00)
LDL Cholesterol: 55 mg/dL (ref 0–99)
NonHDL: 70.07
Total CHOL/HDL Ratio: 2
Triglycerides: 73 mg/dL (ref 0.0–149.0)
VLDL: 14.6 mg/dL (ref 0.0–40.0)

## 2021-06-26 LAB — TSH: TSH: 0.65 u[IU]/mL (ref 0.35–5.50)

## 2021-06-26 NOTE — Progress Notes (Signed)
Subjective:    Patient ID: [name], female    DOB: 1935/05/17, 86 y.o.   MRN: [id number]  DOS:  06/26/2021 Type of visit - description: Follow-up from previous visit  Since the last office visit she is feeling well. Had no further syncopes. Notes from cardiology reviewed. Denies chest pain or difficulty breathing.  History of anemia: No nausea, vomiting, diarrhea.  No blood in the stools.   Review of Systems See above   Past Medical History:  Diagnosis Date   Abnormal MRI of abdomen    ABNORMAL CT and MRI of pancreas, last MRI 1-09: after d/w GI we rec. to f/u clinically and redo XRs if problems     Anemia    CRI (chronic renal insufficiency)    elevated creatinine 4-11: stage 3 CKD, sees nephrology, history of solitary    Diverticulosis    Glaucoma    Gout    h/o    Hypertension    Hyperthyroidism    s/p ablation, Dr Debbora Presto    Past Surgical History:  Procedure Laterality Date   CATARACT EXTRACTION W/ INTRAOCULAR LENS  IMPLANT, BILATERAL  2006   COLONOSCOPY     ORIF TIBIA PLATEAU  10/24/2012   Procedure: OPEN REDUCTION INTERNAL FIXATION (ORIF) TIBIAL PLATEAU;  Surgeon: Jessy Oto, MD;  Location: [facility];  Service: Orthopedics;  Laterality: Left;    Allergies as of 06/26/2021   No Known Allergies      Medication List        Accurate as of June 26, 2021 11:59 PM. If you have any questions, ask your nurse or doctor.          acetaminophen 500 MG tablet Commonly known as: TYLENOL Take 500-1,000 mg by mouth every 6 (six) hours as needed for fever or headache (pain).   amLODipine 10 MG tablet Commonly known as: NORVASC Take 1 tablet (10 mg total) by mouth daily.   ascorbic acid 500 MG tablet Commonly known as: VITAMIN C Take 1 tablet (500 mg total) by mouth daily.   levothyroxine 75 MCG tablet Commonly known as: SYNTHROID TAKE 1 TABLET EVERY DAY FOR 6 DAYS OF THE WEEK (SKIP 1 DAY)   Vitamin D3 25 MCG tablet Commonly known as: Vitamin D Take 1  tablet (1,000 Units total) by mouth daily.           Objective:   Physical Exam BP 122/70 (BP Location: Left Arm, Patient Position: Sitting, Cuff Size: Small)   Pulse 89   Temp 97.9 F (36.6 C) (Oral)   Resp 16   Ht '5\' 5"'$  (1.651 m)   Wt 129 lb 2 oz (58.6 kg)   SpO2 97%   BMI 21.49 kg/m  General:   Well developed, NAD, BMI noted. HEENT:  Normocephalic . Face symmetric, atraumatic. Lungs:  CTA B Normal respiratory effort, no intercostal retractions, no accessory muscle use. Heart: RRR,  no murmur.  Lower extremities: no pretibial edema bilaterally  Skin: Not pale. Not jaundice Neurologic:  alert & oriented X3.  Speech normal, gait appropriate for age and unassisted Psych--  Cognition and judgment appear intact.  Cooperative with normal attention span and concentration.  Behavior appropriate. No anxious or depressed appearing.     Assessment       Assessment HTN CKD, stage III, Single kidney (absent L one); sees nephrology prn, last visit 2014. (Creatinine 1.8 = clearance ~ 30) Hypothyroidism:  s/p ablation, Dr. Chalmers Cater, no f/u schedule  Glaucoma H/o Gout Chronic  anemia: Colonoscopy 08-2012, declines further screenings.  Normal ferritin before. H/o abnormal MRI abdomen , last MRI 2009, saw GI ---> follow up clinically, redo MRI prn Covid pneumonia, admitted 11-2019  PLAN:  Syncope: Saw cardiology 05/03/2021, office visit note reviewed, they Rx a ZIO monitor for 2 weeks and recommend good hydration. Her ZIO monitor showed no bradycardia.  They were considering a loop monitor.  Plans to see cardiology again as recommended. HTN: BP satisfactory, continue amlodipine. CKD: Does not see nephrology, offered referral,  is not inclined to see them.  Check BMP Hypothyroidism: Check TSH Preventive care: Shingrix recommended, declined  COVID-vaccine booster recommended POA discussed RTC CPX 5 to 6 months   This visit occurred during the SARS-CoV-2 public health  emergency.  Safety protocols were in place, including screening questions prior to the visit, additional usage of staff PPE, and extensive cleaning of exam room while observing appropriate contact time as indicated for disinfecting solutions.

## 2021-06-26 NOTE — Patient Instructions (Addendum)
Recommend to proceed with your 4th covid vaccine. You may do this downstairs at our pharmacy today if you like.   Check the  blood pressure   BP GOAL is between 110/65 and  135/85. If it is consistently higher or lower, let me know     GO TO THE LAB : Get the blood work     [place], [place] back for a physical exam in 5 to 6 months   "Living will", "[place] of attorney": Advanced care planning  (If you already have a living will or healthcare power of attorney, please bring the copy to be scanned in your chart.)  Advance care planning is a process that supports adults in  understanding and sharing their preferences regarding future medical care.   The patient's preferences are recorded in documents called Advance Directives.    Advanced directives are completed (and can be modified at any time) while the patient is in full mental capacity.   The documentation should be available at all times to the patient, the family and the healthcare providers.  Bring in a copy to be scanned in your chart is an excellent idea and is recommended   This legal documents direct treatment decision making and/or appoint a surrogate to make the decision if the patient is not capable to do so.    Advance directives can be documented in many types of formats,  documents have names such as:  Lliving will  Durable power of attorney for healthcare (healthcare proxy or healthcare power of attorney)  Combined directives  Physician orders for life-sustaining treatment    More information at:  [email or web address]

## 2021-06-27 NOTE — Assessment & Plan Note (Signed)
Syncope: Saw cardiology 05/03/2021, office visit note reviewed, they Rx a ZIO monitor for 2 weeks and recommend good hydration. Her ZIO monitor showed no bradycardia.  They were considering a loop monitor.  Plans to see cardiology again as recommended. HTN: BP satisfactory, continue amlodipine. CKD: Does not see nephrology, offered referral,  is not inclined to see them.  Check BMP Hypothyroidism: Check TSH Preventive care: Shingrix recommended, declined  COVID-vaccine booster recommended POA discussed RTC CPX 5 to 6 months

## 2021-08-21 ENCOUNTER — Ambulatory Visit: Payer: Medicare HMO | Admitting: Cardiology

## 2021-09-04 ENCOUNTER — Other Ambulatory Visit: Payer: Self-pay | Admitting: Internal Medicine

## 2021-09-04 DIAGNOSIS — E039 Hypothyroidism, unspecified: Secondary | ICD-10-CM

## 2021-09-16 ENCOUNTER — Other Ambulatory Visit: Payer: Self-pay

## 2021-09-16 ENCOUNTER — Encounter: Payer: Self-pay | Admitting: Cardiology

## 2021-09-16 ENCOUNTER — Ambulatory Visit: Payer: Medicare HMO | Admitting: Cardiology

## 2021-09-16 VITALS — BP 170/70 | HR 69 | Ht 66.5 in | Wt 137.2 lb

## 2021-09-16 DIAGNOSIS — I10 Essential (primary) hypertension: Secondary | ICD-10-CM | POA: Diagnosis not present

## 2021-09-16 DIAGNOSIS — N183 Chronic kidney disease, stage 3 unspecified: Secondary | ICD-10-CM

## 2021-09-16 NOTE — Patient Instructions (Addendum)
Medication Instructions:  Your physician recommends that you continue on your current medications as directed. Please refer to the Current Medication list given to you today.  *If you need a refill on your cardiac medications before your next appointment, please call your pharmacy*   Lab Work: None If you have labs (blood work) drawn today and your tests are completely normal, you will receive your results only by: [place] (if you have MyChart) OR A paper copy in the mail If you have any lab test that is abnormal or we need to change your treatment, we will call you to review the results.   Testing/Procedures: None   Follow-Up: At [facility], you and your health needs are our priority.  As part of our continuing mission to provide you with exceptional heart care, we have created designated Provider Care Teams.  These Care Teams include your primary Cardiologist (physician) and Advanced Practice Providers (APPs -  Physician Assistants and Nurse Practitioners) who all work together to provide you with the care you need, when you need it.  We recommend signing up for the patient portal called "MyChart".  Sign up information is provided on this After Visit Summary.  MyChart is used to connect with patients for Virtual Visits (Telemedicine).  Patients are able to view lab/test results, encounter notes, upcoming appointments, etc.  Non-urgent messages can be sent to your provider as well.   To learn more about what you can do with MyChart, go to [email or web address].    Your next appointment:   1 year(s)  The format for your next appointment:   In Person  Provider:   Berniece Salines, DO [address], [place], [place] [address]    Other Instructions   Please take your blood pressure daily and send the information next week.

## 2021-09-16 NOTE — Progress Notes (Signed)
Cardiology Office Note:    Date:  09/16/2021   ID:  ASHLEYMARIE Harris, DOB 05/07/1935, MRN [id number]  PCP:  Colon Branch, MD  Cardiologist:  Berniece Salines, DO  Electrophysiologist:  None   Referring MD: Colon Branch, MD   " I am doing well"  History of Present Illness:    [name] is a 86 y.o. female with a hx of hypertension, chronic kidney disease and hypothyroidism.   She Did see Dr. Bettina Gavia in 05/03/2021,At that time she was posthospitalization for unresponsiveness/syncope.  At that visit he placed a monitor on the patient which he had shared the results with her.  Overall there were no evidence of pulses she did have supraventricular tachycardia.  She is here today for follow-up visit.  The patient and her daughter tells me that she has not had any of the syncope episodes.  She has been doing well since her last visit.  Denies any chest pain, shortness of breath, lightheadedness or dizziness.  Or any syncope episodes.   Past Medical History:  Diagnosis Date   Abnormal MRI of abdomen    ABNORMAL CT and MRI of pancreas, last MRI 1-09: after d/w GI we rec. to f/u clinically and redo XRs if problems     Anemia    CRI (chronic renal insufficiency)    elevated creatinine 4-11: stage 3 CKD, sees nephrology, history of solitary    Diverticulosis    Glaucoma    Gout    h/o    Hypertension    Hyperthyroidism    s/p ablation, Dr Debbora Presto    Past Surgical History:  Procedure Laterality Date   CATARACT EXTRACTION W/ INTRAOCULAR LENS  IMPLANT, BILATERAL  2006   COLONOSCOPY     ORIF TIBIA PLATEAU  10/24/2012   Procedure: OPEN REDUCTION INTERNAL FIXATION (ORIF) TIBIAL PLATEAU;  Surgeon: Jessy Oto, MD;  Location: [facility];  Service: Orthopedics;  Laterality: Left;    Current Medications: Current Meds  Medication Sig   acetaminophen (TYLENOL) 500 MG tablet Take 500-1,000 mg by mouth every 6 (six) hours as needed for fever or headache (pain).   amLODipine (NORVASC) 10 MG tablet  Take 1 tablet (10 mg total) by mouth daily.   ascorbic acid (VITAMIN C) 500 MG tablet Take 1 tablet (500 mg total) by mouth daily.   cholecalciferol (VITAMIN D) 25 MCG tablet Take 1 tablet (1,000 Units total) by mouth daily.   levothyroxine (SYNTHROID) 75 MCG tablet TAKE 1 TABLET EVERY DAY FOR 6 DAYS OF THE WEEK (SKIP 1 DAY)     Allergies:   Patient has no known allergies.   Social History   Socioeconomic History   Marital status: Widowed    Spouse name: Not on file   Number of children: 1   Years of education: Not on file   Highest education level: Not on file  Occupational History   Occupation: retired , Scientist, water quality   Tobacco Use   Smoking status: Never   Smokeless tobacco: Never  Substance and Sexual Activity   Alcohol use: No   Drug use: No   Sexual activity: Not on file  Other Topics Concern   Not on file  Social History Narrative   Divorced, used to live with her sister, died ~ 10-12-19     Independent, drives    Household: pt and a nephew   Has one daughter and another sister who lives here in town   Social Determinants of Health  Financial Resource Strain: Not on file  Food Insecurity: Not on file  Transportation Needs: Not on file  Physical Activity: Not on file  Stress: Not on file  Social Connections: Not on file     Family History: The patient's family history includes Cancer in her sister; Colon cancer (age of onset: 62) in her sister; Diabetes in her mother; Heart attack in her brother and sister; Pancreatic cancer in her brother. There is no history of Esophageal cancer, Stomach cancer, Rectal cancer, or Breast cancer.  ROS:   Review of Systems  Constitution: Negative for decreased appetite, fever and weight gain.  HENT: Negative for congestion, ear discharge, hoarse voice and sore throat.   Eyes: Negative for discharge, redness, vision loss in right eye and visual halos.  Cardiovascular: Negative for chest pain, dyspnea on exertion, leg swelling,  orthopnea and palpitations.  Respiratory: Negative for cough, hemoptysis, shortness of breath and snoring.   Endocrine: Negative for heat intolerance and polyphagia.  Hematologic/Lymphatic: Negative for bleeding problem. Does not bruise/bleed easily.  Skin: Negative for flushing, nail changes, rash and suspicious lesions.  Musculoskeletal: Negative for arthritis, joint pain, muscle cramps, myalgias, neck pain and stiffness.  Gastrointestinal: Negative for abdominal pain, bowel incontinence, diarrhea and excessive appetite.  Genitourinary: Negative for decreased libido, genital sores and incomplete emptying.  Neurological: Negative for brief paralysis, focal weakness, headaches and loss of balance.  Psychiatric/Behavioral: Negative for altered mental status, depression and suicidal ideas.  Allergic/Immunologic: Negative for HIV exposure and persistent infections.    EKGs/Labs/Other Studies Reviewed:    The following studies were reviewed today:   EKG: None today  Zio monitor  Patch Wear Time:  14 days and 0 hours (2022-06-10T15:13:40-0400 to 2022-06-24T15:13:40-0400)   Patient had a min HR of 56 bpm, max HR of 174 bpm, and avg HR of 79 bpm. Predominant underlying rhythm was Sinus Rhythm. 33 Supraventricular Tachycardia runs occurred, the run with the fastest interval lasting 6 beats with a max rate of 174 bpm, the  longest lasting 16 beats with an avg rate of 152 bpm. Isolated SVEs were occasional (1.0%, 16028), SVE Couplets were rare (<1.0%, 940), and SVE Triplets were rare (<1.0%, 342). Isolated VEs were rare (<1.0%), VE Couplets were rare (<1.0%), and no VE  Triplets were present.   There were no pauses of 3 seconds or greater and no episodes of sinus node or second or third-degree AV nodal block. There were no symptomatic or triggered events. Ventricular ectopy was rare. Supraventricular ectopy is rare and no episodes of atrial fibrillation or flutter.  There were 33 brief runs of  SVT the longest 6 seconds at a rate of 152 bpm.  He is were asymptomatic.     Conclusion absence of bradycardia in a patient with syncope.  The brief episodes of SVT were asymptomatic.  Recent Labs: 03/24/2021: Hemoglobin 10.0; Platelets 349 06/26/2021: BUN 37; Creatinine, Ser 2.02; Potassium 5.0; Sodium 139; TSH 0.65  Recent Lipid Panel    Component Value Date/Time   CHOL 146 06/26/2021 1128   TRIG 73.0 06/26/2021 1128   HDL 76.30 06/26/2021 1128   CHOLHDL 2 06/26/2021 1128   VLDL 14.6 06/26/2021 1128   LDLCALC 55 06/26/2021 1128    Physical Exam:    VS:  BP (!) 170/70   Pulse 69   Ht 5' 6.5" (1.689 m)   Wt 137 lb 3.2 oz (62.2 kg)   SpO2 98%   BMI 21.81 kg/m     Wt Readings from Last  3 Encounters:  09/16/21 137 lb 3.2 oz (62.2 kg)  06/26/21 129 lb 2 oz (58.6 kg)  05/30/21 130 lb (59 kg)     GEN: Well nourished, well developed in no acute distress HEENT: Normal NECK: No JVD; No carotid bruits LYMPHATICS: No lymphadenopathy CARDIAC: S1S2 noted,RRR, no murmurs, rubs, gallops RESPIRATORY:  Clear to auscultation without rales, wheezing or rhonchi  ABDOMEN: Soft, non-tender, non-distended, +bowel sounds, no guarding. EXTREMITIES: No edema, No cyanosis, no clubbing MUSCULOSKELETAL:  No deformity  SKIN: Warm and dry NEUROLOGIC:  Alert and oriented x 3, non-focal PSYCHIATRIC:  Normal affect, good insight  ASSESSMENT:    1. Hypertension, unspecified type   2. Stage 3 chronic kidney disease, unspecified whether stage 3a or 3b CKD ([place])    PLAN:    She appears to be doing well from a cardiovascular standpoint.  Her blood pressure slightly elevated in the office today.  But the patient reports that her blood pressure has not been this high.  Her systolic has been in the 962X.  I have asked the patient her daughter to take her blood pressure daily and send me 1 week worth of her blood pressure for my review.  They are both in agreement.  Giving the report of her systolics in the  528U I am holding off on adding any additional antihypertensives to avoid sustained hypotension at home.   The patient is in agreement with the above plan. The patient left the office in stable condition.  The patient will follow up in 1 year.   Medication Adjustments/Labs and Tests Ordered: Current medicines are reviewed at length with the patient today.  Concerns regarding medicines are outlined above.  No orders of the defined types were placed in this encounter.  No orders of the defined types were placed in this encounter.   Patient Instructions  Medication Instructions:  Your physician recommends that you continue on your current medications as directed. Please refer to the Current Medication list given to you today.  *If you need a refill on your cardiac medications before your next appointment, please call your pharmacy*   Lab Work: None If you have labs (blood work) drawn today and your tests are completely normal, you will receive your results only by: [place] (if you have MyChart) OR A paper copy in the mail If you have any lab test that is abnormal or we need to change your treatment, we will call you to review the results.   Testing/Procedures: None   Follow-Up: At [facility], you and your health needs are our priority.  As part of our continuing mission to provide you with exceptional heart care, we have created designated Provider Care Teams.  These Care Teams include your primary Cardiologist (physician) and Advanced Practice Providers (APPs -  Physician Assistants and Nurse Practitioners) who all work together to provide you with the care you need, when you need it.  We recommend signing up for the patient portal called "MyChart".  Sign up information is provided on this After Visit Summary.  MyChart is used to connect with patients for Virtual Visits (Telemedicine).  Patients are able to view lab/test results, encounter notes, upcoming appointments,  etc.  Non-urgent messages can be sent to your provider as well.   To learn more about what you can do with MyChart, go to [email or web address].    Your next appointment:   1 year(s)  The format for your next appointment:   In Person  Provider:  Berniece Salines, DO [address], [place], [place] [address]    Other Instructions   Please take your blood pressure daily and send the information next week.    Adopting a Healthy Lifestyle.  Know what a healthy weight is for you (roughly BMI <25) and aim to maintain this   Aim for 7+ servings of fruits and vegetables daily   65-80+ fluid ounces of water or unsweet tea for healthy kidneys   Limit to max 1 drink of alcohol per day; avoid smoking/tobacco   Limit animal fats in diet for cholesterol and heart health - choose grass fed whenever available   Avoid highly processed foods, and foods high in saturated/trans fats   Aim for low stress - take time to unwind and care for your mental health   Aim for 150 min of moderate intensity exercise weekly for heart health, and weights twice weekly for bone health   Aim for 7-9 hours of sleep daily   When it comes to diets, agreement about the perfect plan isnt easy to find, even among the experts. Experts at the [employer] developed an idea known as the Healthy Eating Plate. Just imagine a plate divided into logical, healthy portions.   The emphasis is on diet quality:   Load up on vegetables and fruits - one-half of your plate: Aim for color and variety, and remember that potatoes dont count.   Go for whole grains - one-quarter of your plate: Whole wheat, barley, wheat berries, quinoa, oats, brown rice, and foods made with them. If you want pasta, go with whole wheat pasta.   Protein power - one-quarter of your plate: Fish, chicken, beans, and nuts are all healthy, versatile protein sources. Limit red meat.   The diet, however, does go beyond the plate,  offering a few other suggestions.   Use healthy plant oils, such as olive, canola, soy, corn, sunflower and peanut. Check the labels, and avoid partially hydrogenated oil, which have unhealthy trans fats.   If youre thirsty, drink water. Coffee and tea are good in moderation, but skip sugary drinks and limit milk and dairy products to one or two daily servings.   The type of carbohydrate in the diet is more important than the amount. Some sources of carbohydrates, such as vegetables, fruits, whole grains, and beans-are healthier than others.   Finally, stay active  Signed, Berniece Salines, DO  09/16/2021 5:27 PM    [facility]

## 2021-10-10 DIAGNOSIS — Z23 Encounter for immunization: Secondary | ICD-10-CM | POA: Diagnosis not present

## 2021-10-10 DIAGNOSIS — N1832 Chronic kidney disease, stage 3b: Secondary | ICD-10-CM | POA: Diagnosis not present

## 2021-10-10 DIAGNOSIS — Q600 Renal agenesis, unilateral: Secondary | ICD-10-CM | POA: Diagnosis not present

## 2021-10-10 DIAGNOSIS — N189 Chronic kidney disease, unspecified: Secondary | ICD-10-CM | POA: Diagnosis not present

## 2021-10-10 DIAGNOSIS — N390 Urinary tract infection, site not specified: Secondary | ICD-10-CM | POA: Diagnosis not present

## 2021-10-10 DIAGNOSIS — I129 Hypertensive chronic kidney disease with stage 1 through stage 4 chronic kidney disease, or unspecified chronic kidney disease: Secondary | ICD-10-CM | POA: Diagnosis not present

## 2021-10-10 LAB — COMPREHENSIVE METABOLIC PANEL
Albumin: 4.2 (ref 3.5–5.0)
Calcium: 9.6 (ref 8.7–10.7)
GFR calc Af Amer: 29

## 2021-10-10 LAB — BASIC METABOLIC PANEL
BUN: 30 — AB (ref 4–21)
CO2: 21 (ref 13–22)
Chloride: 109 — AB (ref 99–108)
Creatinine: 1.7 — AB (ref 0.5–1.1)
Glucose: 75
Potassium: 4.7 (ref 3.4–5.3)
Sodium: 140 (ref 137–147)

## 2021-10-10 LAB — CBC AND DIFFERENTIAL: Hemoglobin: 10.2 — AB (ref 12.0–16.0)

## 2021-10-10 LAB — IRON,TIBC AND FERRITIN PANEL
%SAT: 16
Ferritin: 17
Iron: 53
TIBC: 337
UIBC: 284

## 2021-10-14 ENCOUNTER — Encounter: Payer: Self-pay | Admitting: Internal Medicine

## 2021-10-23 ENCOUNTER — Encounter (HOSPITAL_COMMUNITY): Payer: Medicare HMO

## 2021-10-28 ENCOUNTER — Other Ambulatory Visit (HOSPITAL_COMMUNITY): Payer: Self-pay

## 2021-10-29 ENCOUNTER — Other Ambulatory Visit: Payer: Self-pay

## 2021-10-29 ENCOUNTER — Encounter (HOSPITAL_COMMUNITY)
Admission: RE | Admit: 2021-10-29 | Discharge: 2021-10-29 | Disposition: A | Discharge status: Home / Self Care | Payer: Medicare HMO | Source: Ambulatory Visit | Attending: Nephrology | Admitting: Nephrology

## 2021-10-29 DIAGNOSIS — D631 Anemia in chronic kidney disease: Secondary | ICD-10-CM | POA: Diagnosis not present

## 2021-10-29 DIAGNOSIS — N189 Chronic kidney disease, unspecified: Secondary | ICD-10-CM | POA: Insufficient documentation

## 2021-10-29 MED ORDER — SODIUM CHLORIDE 0.9 % IV SOLN
510.0000 mg | INTRAVENOUS | Status: DC
  Administered 2021-10-29: 510 mg via INTRAVENOUS
  Filled 2021-10-29: qty 510

## 2021-11-05 ENCOUNTER — Encounter (HOSPITAL_COMMUNITY)
Admission: RE | Admit: 2021-11-05 | Discharge: 2021-11-05 | Disposition: A | Discharge status: Home / Self Care | Payer: Medicare HMO | Source: Ambulatory Visit | Attending: Nephrology | Admitting: Nephrology

## 2021-11-05 ENCOUNTER — Other Ambulatory Visit: Payer: Self-pay

## 2021-11-05 DIAGNOSIS — D631 Anemia in chronic kidney disease: Secondary | ICD-10-CM | POA: Diagnosis not present

## 2021-11-05 DIAGNOSIS — N189 Chronic kidney disease, unspecified: Secondary | ICD-10-CM | POA: Diagnosis not present

## 2021-11-05 MED ORDER — SODIUM CHLORIDE 0.9 % IV SOLN
510.0000 mg | INTRAVENOUS | Status: AC
  Administered 2021-11-05: 510 mg via INTRAVENOUS
  Filled 2021-11-05: qty 17

## 2021-11-27 ENCOUNTER — Ambulatory Visit (INDEPENDENT_AMBULATORY_CARE_PROVIDER_SITE_OTHER): Payer: Medicare HMO | Admitting: Internal Medicine

## 2021-11-27 ENCOUNTER — Encounter: Payer: Self-pay | Admitting: Internal Medicine

## 2021-11-27 VITALS — BP 126/62 | HR 62 | Temp 98.1°F | Resp 16 | Ht 67.0 in | Wt 136.4 lb

## 2021-11-27 DIAGNOSIS — Z23 Encounter for immunization: Secondary | ICD-10-CM | POA: Diagnosis not present

## 2021-11-27 DIAGNOSIS — Z0000 Encounter for general adult medical examination without abnormal findings: Secondary | ICD-10-CM

## 2021-11-27 DIAGNOSIS — E039 Hypothyroidism, unspecified: Secondary | ICD-10-CM | POA: Diagnosis not present

## 2021-11-27 LAB — HEPATIC FUNCTION PANEL
ALT: 10 U/L (ref 0–35)
AST: 16 U/L (ref 0–37)
Albumin: 3.9 g/dL (ref 3.5–5.2)
Alkaline Phosphatase: 92 U/L (ref 39–117)
Bilirubin, Direct: 0.1 mg/dL (ref 0.0–0.3)
Total Bilirubin: 0.7 mg/dL (ref 0.2–1.2)
Total Protein: 7.6 g/dL (ref 6.0–8.3)

## 2021-11-27 LAB — TSH: TSH: 4.25 u[IU]/mL (ref 0.35–5.50)

## 2021-11-27 NOTE — Patient Instructions (Addendum)
Check the  blood pressure regularly BP GOAL is between 110/65 and  135/85. If it is consistently higher or lower, let me know  Vaccines are recommended: COVID booster   GO TO THE LAB : Get the blood work     [place], [place] back for a checkup in 6 to 8 months    "Living will", "[place] of attorney": Advanced care planning  (If you already have a living will or healthcare power of attorney, please bring the copy to be scanned in your chart.)  Advance care planning is a process that supports adults in  understanding and sharing their preferences regarding future medical care.   The patient's preferences are recorded in documents called Advance Directives.    Advanced directives are completed (and can be modified at any time) while the patient is in full mental capacity.   The documentation should be available at all times to the patient, the family and the healthcare providers.  Bring in a copy to be scanned in your chart is an excellent idea and is recommended   This legal documents direct treatment decision making and/or appoint a surrogate to make the decision if the patient is not capable to do so.    Advance directives can be documented in many types of formats,  documents have names such as:  Lliving will  Durable power of attorney for healthcare (healthcare proxy or healthcare power of attorney)  Combined directives  Physician orders for life-sustaining treatment    More information at:  [email or web address]

## 2021-11-27 NOTE — Assessment & Plan Note (Signed)
Here for CPX HTN: Saw cardiology 09/16/2021: Felt to be stable regards hypertension.  BP today is very good, continue amlodipine Syncope: Not discussed with cardiology at the October visit, no further episodes CKD: Follow-up by nephrology, they order iron infusion recently. Chronic anemia: See above Hypothyroidism: On Synthroid, check TSH RTC 6 to 8 months

## 2021-11-27 NOTE — Assessment & Plan Note (Signed)
-  Td: 2015 - PNM 23: 2015; prevnar : 2016; PNM 20 today - shingrex : declined before -COVID vaccine booster recommended - Had a flu shot - Colon, cervical and breast cancer screenings: Patient elected to stop screenings, see previous entries -Off aspirin due to age - DEXA?   Declined again today -diet and  exercise discussed  -ACP information provided

## 2021-11-27 NOTE — Progress Notes (Signed)
Subjective:    Patient ID: [name], female    DOB: 1935/01/18, 87 y.o.   MRN: [id number]  DOS:  11/27/2021 Type of visit - description: CPX  Here for CPX, states that she feels well and has no concerns. Specifically denies chest pain no difficulty breathing No anxiety or depression   Review of Systems   A 14 point review of systems is negative    Past Medical History:  Diagnosis Date   Abnormal MRI of abdomen    ABNORMAL CT and MRI of pancreas, last MRI 1-09: after d/w GI we rec. to f/u clinically and redo XRs if problems     Anemia    CRI (chronic renal insufficiency)    elevated creatinine 4-11: stage 3 CKD, sees nephrology, history of solitary    Diverticulosis    Glaucoma    Gout    h/o    Hypertension    Hyperthyroidism    s/p ablation, Dr Debbora Presto    Past Surgical History:  Procedure Laterality Date   CATARACT EXTRACTION W/ INTRAOCULAR LENS  IMPLANT, BILATERAL  2006   COLONOSCOPY     ORIF TIBIA PLATEAU  10/24/2012   Procedure: OPEN REDUCTION INTERNAL FIXATION (ORIF) TIBIAL PLATEAU;  Surgeon: Jessy Oto, MD;  Location: [facility];  Service: Orthopedics;  Laterality: Left;   Social History   Socioeconomic History   Marital status: Widowed    Spouse name: Not on file   Number of children: 1   Years of education: Not on file   Highest education level: Not on file  Occupational History   Occupation: retired , Scientist, water quality   Tobacco Use   Smoking status: Never   Smokeless tobacco: Never  Substance and Sexual Activity   Alcohol use: No   Drug use: No   Sexual activity: Not on file  Other Topics Concern   Not on file  Social History Narrative   Divorced, used to live with her sister, died ~ 10-21-19     Independent, drives    Household: pt and a niece   Has one daughter and another sister who lives here in town   Social Determinants of Health   Financial Resource Strain: Not on file  Food Insecurity: Not on file  Transportation Needs: Not on file   Physical Activity: Not on file  Stress: Not on file  Social Connections: Not on file  Intimate Partner Violence: Not on file     Allergies as of 11/27/2021   No Known Allergies      Medication List        Accurate as of November 27, 2021  9:19 PM. If you have any questions, ask your nurse or doctor.          acetaminophen 500 MG tablet Commonly known as: TYLENOL Take 500-1,000 mg by mouth every 6 (six) hours as needed for fever or headache (pain).   amLODipine 10 MG tablet Commonly known as: NORVASC Take 1 tablet (10 mg total) by mouth daily.   ascorbic acid 500 MG tablet Commonly known as: VITAMIN C Take 1 tablet (500 mg total) by mouth daily.   levothyroxine 75 MCG tablet Commonly known as: SYNTHROID TAKE 1 TABLET EVERY DAY FOR 6 DAYS OF THE WEEK (SKIP 1 DAY)   Vitamin D3 25 MCG tablet Commonly known as: Vitamin D Take 1 tablet (1,000 Units total) by mouth daily.           Objective:   Physical Exam BP 126/62 (  BP Location: Left Arm, Patient Position: Sitting, Cuff Size: Small)    Pulse 62    Temp 98.1 F (36.7 C) (Oral)    Resp 16    Ht 5\' 7"  (1.702 m)    Wt 136 lb 6 oz (61.9 kg)    SpO2 98%    BMI 21.36 kg/m  General: Well developed, NAD, BMI noted Neck: No  thyromegaly  HEENT:  Normocephalic . Face symmetric, atraumatic Lungs:  CTA B Normal respiratory effort, no intercostal retractions, no accessory muscle use. Heart: RRR,  no murmur.  Abdomen:  Not distended, soft, non-tender. No rebound or rigidity.   Lower extremities: no pretibial edema bilaterally  Skin: Exposed areas without rash. Not pale. Not jaundice Neurologic:  alert & oriented X3.  Speech normal, gait appropriate for age and unassisted Strength symmetric and appropriate for age.  Psych: Cognition and judgment appear intact.  Cooperative with normal attention span and concentration.  Behavior appropriate. No anxious or depressed appearing.     Assessment       Assessment HTN CKD, stage III, Single kidney (absent L one); sees nephrology prn, last visit 2014. (Creatinine 1.8 = clearance ~ 30) Hypothyroidism:  s/p ablation, Dr. Chalmers Cater, no f/u schedule  Glaucoma H/o Gout Chronic anemia: Colonoscopy 08-2012, declines further screenings.  Normal ferritin before. H/o abnormal MRI abdomen , last MRI 2009, saw GI ---> follow up clinically, redo MRI prn Covid pneumonia, admitted 11-2019  PLAN:  Here for CPX HTN: Saw cardiology 09/16/2021: Felt to be stable regards hypertension.  BP today is very good, continue amlodipine Syncope: Not discussed with cardiology at the October visit, no further episodes CKD: Follow-up by nephrology, they order iron infusion recently. Chronic anemia: See above Hypothyroidism: On Synthroid, check TSH RTC 6 to 8 months      This visit occurred during the SARS-CoV-2 public health emergency.  Safety protocols were in place, including screening questions prior to the visit, additional usage of staff PPE, and extensive cleaning of exam room while observing appropriate contact time as indicated for disinfecting solutions.

## 2021-12-31 ENCOUNTER — Ambulatory Visit (INDEPENDENT_AMBULATORY_CARE_PROVIDER_SITE_OTHER): Payer: Medicare HMO

## 2021-12-31 VITALS — Ht 67.0 in | Wt 136.0 lb

## 2021-12-31 DIAGNOSIS — Z0000 Encounter for general adult medical examination without abnormal findings: Secondary | ICD-10-CM

## 2021-12-31 NOTE — Progress Notes (Addendum)
Subjective:   [name] is a 87 y.o. female who presents for Medicare Annual (Subsequent) preventive examination.  I connected with [name] today by telephone and verified that I am speaking with the correct person using two identifiers. Location patient: home Location provider: work Persons participating in the virtual visit: patient, Marine scientist.    I discussed the limitations, risks, security and privacy concerns of performing an evaluation and management service by telephone and the availability of in person appointments. I also discussed with the patient that there may be a patient responsible charge related to this service. The patient expressed understanding and verbally consented to this telephonic visit.    Interactive audio and video telecommunications were attempted between this provider and patient, however failed, due to patient having technical difficulties OR patient did not have access to video capability.  We continued and completed visit with audio only.  Some vital signs may be absent or patient reported.   Time Spent with patient on telephone encounter: 20 minutes   Review of Systems     Cardiac Risk Factors include: advanced age (>7men, >40 women);hypertension;sedentary lifestyle     Objective:    Today's Vitals   12/31/21 1301  Weight: 136 lb (61.7 kg)  Height: 5\' 7"  (1.702 m)   Body mass index is 21.3 kg/m.  Advanced Directives 12/31/2021 09/26/2019 09/17/2018 09/16/2017 01/26/2017 10/24/2012 10/24/2012  Does Patient Have a Medical Advance Directive? Yes No No No No Patient does not have advance directive Patient does not have advance directive  Does patient want to make changes to medical advance directive? Yes (MAU/Ambulatory/Procedural Areas - Information given) - - - - - -  Would patient like information on creating a medical advance directive? - No - Patient declined Yes (MAU/Ambulatory/Procedural Areas - Information given) Yes (MAU/Ambulatory/Procedural  Areas - Information given);No - Patient declined No - Patient declined - -  Pre-existing out of facility DNR order (yellow form or pink MOST form) - - - - - No No    Current Medications (verified) Outpatient Encounter Medications as of 12/31/2021  Medication Sig   acetaminophen (TYLENOL) 500 MG tablet Take 500-1,000 mg by mouth every 6 (six) hours as needed for fever or headache (pain).   amLODipine (NORVASC) 10 MG tablet Take 1 tablet (10 mg total) by mouth daily.   ascorbic acid (VITAMIN C) 500 MG tablet Take 1 tablet (500 mg total) by mouth daily.   cholecalciferol (VITAMIN D) 25 MCG tablet Take 1 tablet (1,000 Units total) by mouth daily.   levothyroxine (SYNTHROID) 75 MCG tablet TAKE 1 TABLET EVERY DAY FOR 6 DAYS OF THE WEEK (SKIP 1 DAY)   No facility-administered encounter medications on file as of 12/31/2021.    Allergies (verified) Patient has no known allergies.   History: Past Medical History:  Diagnosis Date   Abnormal MRI of abdomen    ABNORMAL CT and MRI of pancreas, last MRI 1-09: after d/w GI we rec. to f/u clinically and redo XRs if problems     Anemia    CRI (chronic renal insufficiency)    elevated creatinine 4-11: stage 3 CKD, sees nephrology, history of solitary    Diverticulosis    Glaucoma    Gout    h/o    Hypertension    Hyperthyroidism    s/p ablation, Dr Debbora Presto   Past Surgical History:  Procedure Laterality Date   CATARACT EXTRACTION W/ INTRAOCULAR LENS  IMPLANT, BILATERAL  2006   COLONOSCOPY     ORIF  TIBIA PLATEAU  10/24/2012   Procedure: OPEN REDUCTION INTERNAL FIXATION (ORIF) TIBIAL PLATEAU;  Surgeon: Jessy Oto, MD;  Location: [facility];  Service: Orthopedics;  Laterality: Left;   Family History  Problem Relation Age of Onset   Heart attack Sister    Colon cancer Sister 76   Cancer Sister    Heart attack Brother    Pancreatic cancer Brother    Diabetes Mother    Esophageal cancer Neg Hx    Stomach cancer Neg Hx    Rectal cancer Neg Hx     Breast cancer Neg Hx    Social History   Socioeconomic History   Marital status: Widowed    Spouse name: Not on file   Number of children: 1   Years of education: Not on file   Highest education level: Not on file  Occupational History   Occupation: retired , Scientist, water quality   Tobacco Use   Smoking status: Never   Smokeless tobacco: Never  Substance and Sexual Activity   Alcohol use: No   Drug use: No   Sexual activity: Not on file  Other Topics Concern   Not on file  Social History Narrative   Divorced, used to live with her sister, died ~ Sep 26, 2019     Independent, drives    Household: pt and a niece   Has one daughter and another sister who lives here in town   Social Determinants of Radio broadcast assistant Strain: Low Risk    Difficulty of Paying Living Expenses: Not hard at all  Food Insecurity: No Food Insecurity   Worried About Charity fundraiser in the Last Year: Never true   Arboriculturist in the Last Year: Never true  Transportation Needs: No Transportation Needs   Lack of Transportation (Medical): No   Lack of Transportation (Non-Medical): No  Physical Activity: Inactive   Days of Exercise per Week: 0 days   Minutes of Exercise per Session: 0 min  Stress: No Stress Concern Present   Feeling of Stress : Not at all  Social Connections: Moderately Integrated   Frequency of Communication with Friends and Family: More than three times a week   Frequency of Social Gatherings with Friends and Family: More than three times a week   Attends Religious Services: More than 4 times per year   Active Member of [employer] or Organizations: Yes   Attends Archivist Meetings: More than 4 times per year   Marital Status: Widowed    Tobacco Counseling Counseling given: Not Answered   Clinical Intake:  Pre-visit preparation completed: Yes  Pain : No/denies pain     BMI - recorded: 21.3 Nutritional Status: BMI of 19-24  Normal Nutritional Risks:  None Diabetes: No  How often do you need to have someone help you when you read instructions, pamphlets, or other written materials from your doctor or pharmacy?: 1 - Never  Diabetic?No  Interpreter Needed?: No  Information entered by :: Caroleen Hamman LPN   Activities of Daily Living In your present state of health, do you have any difficulty performing the following activities: 12/31/2021 01/07/2021  Hearing? N N  Vision? N N  Difficulty concentrating or making decisions? N N  Walking or climbing stairs? N N  Dressing or bathing? N N  Doing errands, shopping? N N  Preparing Food and eating ? N -  Using the Toilet? N -  In the past six months, have you accidently leaked urine?  N -  Do you have problems with loss of bowel control? N -  Managing your Medications? N -  Managing your Finances? N -  Housekeeping or managing your Housekeeping? N -  Some recent data might be hidden    Patient Care Team: Colon Branch, MD as PCP - General Berniece Salines, DO as PCP - Cardiology (Cardiology) Monna Fam, MD as Consulting Physician (Ophthalmology) Jacelyn Pi, MD as Consulting Physician (Endocrinology)  Indicate any recent Medical Services you may have received from other than [facility] providers in the past year (date may be approximate).     Assessment:   This is a routine wellness examination for [place].  Hearing/Vision screen Hearing Screening - Comments:: C/o mild hearing loss Vision Screening - Comments:: Last eye exam-2 years ago  Dietary issues and exercise activities discussed: Current Exercise Habits: The patient does not participate in regular exercise at present, Exercise limited by: None identified   Goals Addressed             This Visit's Progress    Patient Stated       Drink more water       Depression Screen PHQ 2/9 Scores 12/31/2021 11/27/2021 06/26/2021 03/26/2021 01/07/2021 09/26/2019 09/26/2019  PHQ - 2 Score 0 0 0 0 0 0 0  PHQ- 9 Score - - - - - - -     Fall Risk Fall Risk  12/31/2021 11/27/2021 06/26/2021 03/26/2021 01/07/2021  Falls in the past year? 0 0 0 0 0  Number falls in past yr: 0 0 0 0 0  Injury with Fall? 0 0 0 0 0  Follow up Falls prevention discussed Falls evaluation completed Falls evaluation completed Falls evaluation completed Falls evaluation completed    [place]:  Any stairs in or around the home? Yes  If so, are there any without handrails? No  Home free of loose throw rugs in walkways, pet beds, electrical cords, etc? Yes  Adequate lighting in your home to reduce risk of falls? Yes   ASSISTIVE DEVICES UTILIZED TO PREVENT FALLS:  Life alert? No  Use of a cane, walker or w/c? No  Grab bars in the bathroom? Yes  Shower chair or bench in shower? No  Elevated toilet seat or a handicapped toilet? No   TIMED UP AND GO:  Was the test performed? No . Phone visit   Cognitive Function:Normal cognitive status assessed by this Nurse Health Advisor. No abnormalities found.   MMSE - Mini Mental State Exam 09/16/2017  Orientation to time 5  Orientation to Place 5  Registration 3  Attention/ Calculation 5  Recall 2  Language- name 2 objects 2  Language- repeat 1  Language- follow 3 step command 3  Language- read & follow direction 1  Write a sentence 1  Copy design 1  Total score 29     6CIT Screen 12/31/2021 09/26/2019  What Year? 0 points 0 points  What month? 0 points 0 points  What time? 0 points -  Count back from 20 0 points 0 points  Months in reverse 0 points 0 points  Repeat phrase 0 points 0 points  Total Score 0 -    Immunizations Immunization History  Administered Date(s) Administered   Fluad Quad(high Dose 65+) 09/26/2019, 11/27/2020   Influenza, High Dose Seasonal PF 10/02/2016, 09/16/2017, 09/17/2018   Influenza-Unspecified 10/10/2021   PFIZER(Purple Top)SARS-COV-2 Vaccination 01/20/2020, 02/14/2020, 10/11/2020   PNEUMOCOCCAL CONJUGATE-20 11/27/2021    Pneumococcal Conjugate-13  03/30/2015   Pneumococcal Polysaccharide-23 12/29/2013   Td 03/26/2010   Tdap 12/29/2013    TDAP status: Up to date  Flu Vaccine status: Up to date  Pneumococcal vaccine status: Up to date  Covid-19 vaccine status: Information provided on how to obtain vaccines.   Qualifies for Shingles Vaccine? Yes   Zostavax completed No   Shingrix Completed?: No.    Education has been provided regarding the importance of this vaccine. Patient has been advised to call insurance company to determine out of pocket expense if they have not yet received this vaccine. Advised may also receive vaccine at local pharmacy or Health Dept. Verbalized acceptance and understanding.  Screening Tests Health Maintenance  Topic Date Due   Zoster Vaccines- Shingrix (1 of 2) Never done   COVID-19 Vaccine (4 - Booster for Pfizer series) 12/06/2020   DEXA SCAN  11/27/2022 (Originally 03/30/2002)   TETANUS/TDAP  12/30/2023   Pneumonia Vaccine 36+ Years old  Completed   INFLUENZA VACCINE  Completed   HPV VACCINES  Aged Out    Health Maintenance  Health Maintenance Due  Topic Date Due   Zoster Vaccines- Shingrix (1 of 2) Never done   COVID-19 Vaccine (4 - Booster for Pfizer series) 12/06/2020    Colorectal cancer screening: No longer required.   Mammogram status: Declined  Bone Density status; Declined  Lung Cancer Screening: (Low Dose CT Chest recommended if Age 69-80 years, 30 pack-year currently smoking OR have quit w/in 15years.) does not qualify.     Additional Screening:  Hepatitis C Screening: does not qualify  Vision Screening: Recommended annual ophthalmology exams for early detection of glaucoma and other disorders of the eye. Is the patient up to date with their annual eye exam?  No  Who is the provider or what is the name of the office in which the patient attends annual eye exams? Pt unsure-advised to make an appt.   Dental Screening: Recommended annual dental  exams for proper oral hygiene  Community Resource Referral / Chronic Care Management: CRR required this visit?  No   CCM required this visit?  No      Plan:     I have personally reviewed and noted the following in the patients chart:   Medical and social history Use of alcohol, tobacco or illicit drugs  Current medications and supplements including opioid prescriptions.  Functional ability and status Nutritional status Physical activity Advanced directives List of other physicians Hospitalizations, surgeries, and ER visits in previous 12 months Vitals Screenings to include cognitive, depression, and falls Referrals and appointments  In addition, I have reviewed and discussed with patient certain preventive protocols, quality metrics, and best practice recommendations. A written personalized care plan for preventive services as well as general preventive health recommendations were provided to patient.   Due to this being a telephonic visit, the after visit summary with patients personalized plan was offered to patient via mail or my-chart.  Per request, patient was mailed a copy of AVS.   Marta Antu, LPN   03/28/7321  Nurse Health Advisor  Nurse Notes: None   I have reviewed and agree with Health Coaches documentation.  Kathlene November, MD

## 2021-12-31 NOTE — Patient Instructions (Signed)
[name] , Thank you for taking time to complete your Medicare Wellness Visit. I appreciate your ongoing commitment to your health goals. Please review the following plan we discussed and let me know if I can assist you in the future.   Screening recommendations/referrals: Colonoscopy: No longer required Mammogram: Declined Bone Density: Declined Recommended yearly ophthalmology/optometry visit for glaucoma screening and checkup Recommended yearly dental visit for hygiene and checkup  Vaccinations: Influenza vaccine: Up to date Pneumococcal vaccine: Up to date Tdap vaccine: Up to date Shingles vaccine: Due-May obtain vaccine at  your local pharmacy. Covid-19:Booster available at the pharmacy  Advanced directives: Information mailed.  Conditions/risks identified: See problem list  Next appointment: Follow up in one year for your annual wellness visit    Preventive Care 65 Years and Older, Female Preventive care refers to lifestyle choices and visits with your health care provider that can promote health and wellness. What does preventive care include? A yearly physical exam. This is also called an annual well check. Dental exams once or twice a year. Routine eye exams. Ask your health care provider how often you should have your eyes checked. Personal lifestyle choices, including: Daily care of your teeth and gums. Regular physical activity. Eating a healthy diet. Avoiding tobacco and drug use. Limiting alcohol use. Practicing safe sex. Taking low-dose aspirin every day. Taking vitamin and mineral supplements as recommended by your health care provider. What happens during an annual well check? The services and screenings done by your health care provider during your annual well check will depend on your age, overall health, lifestyle risk factors, and family history of disease. Counseling  Your health care provider may ask you questions about your: Alcohol use. Tobacco  use. Drug use. Emotional well-being. Home and relationship well-being. Sexual activity. Eating habits. History of falls. Memory and ability to understand (cognition). Work and work Statistician. Reproductive health. Screening  You may have the following tests or measurements: Height, weight, and BMI. Blood pressure. Lipid and cholesterol levels. These may be checked every 5 years, or more frequently if you are over 24 years old. Skin check. Lung cancer screening. You may have this screening every year starting at age 13 if you have a 30-pack-year history of smoking and currently smoke or have quit within the past 15 years. Fecal occult blood test (FOBT) of the stool. You may have this test every year starting at age 41. Flexible sigmoidoscopy or colonoscopy. You may have a sigmoidoscopy every 5 years or a colonoscopy every 10 years starting at age 12. Hepatitis C blood test. Hepatitis B blood test. Sexually transmitted disease (STD) testing. Diabetes screening. This is done by checking your blood sugar (glucose) after you have not eaten for a while (fasting). You may have this done every 1-3 years. Bone density scan. This is done to screen for osteoporosis. You may have this done starting at age 69. Mammogram. This may be done every 1-2 years. Talk to your health care provider about how often you should have regular mammograms. Talk with your health care provider about your test results, treatment options, and if necessary, the need for more tests. Vaccines  Your health care provider may recommend certain vaccines, such as: Influenza vaccine. This is recommended every year. Tetanus, diphtheria, and acellular pertussis (Tdap, Td) vaccine. You may need a Td booster every 10 years. Zoster vaccine. You may need this after age 22. Pneumococcal 13-valent conjugate (PCV13) vaccine. One dose is recommended after age 48. Pneumococcal polysaccharide (PPSV23) vaccine.  One dose is recommended  after age 49. Talk to your health care provider about which screenings and vaccines you need and how often you need them. This information is not intended to replace advice given to you by your health care provider. Make sure you discuss any questions you have with your health care provider. Document Released: 12/07/2015 Document Revised: 07/30/2016 Document Reviewed: 09/11/2015 Elsevier Interactive Patient Education  2017 [place] Prevention in the Home Falls can cause injuries. They can happen to people of all ages. There are many things you can do to make your home safe and to help prevent falls. What can I do on the outside of my home? Regularly fix the edges of walkways and driveways and fix any cracks. Remove anything that might make you trip as you walk through a door, such as a raised step or threshold. Trim any bushes or trees on the path to your home. Use bright outdoor lighting. Clear any walking paths of anything that might make someone trip, such as rocks or tools. Regularly check to see if handrails are loose or broken. Make sure that both sides of any steps have handrails. Any raised decks and porches should have guardrails on the edges. Have any leaves, snow, or ice cleared regularly. Use sand or salt on walking paths during winter. Clean up any spills in your garage right away. This includes oil or grease spills. What can I do in the bathroom? Use night lights. Install grab bars by the toilet and in the tub and shower. Do not use towel bars as grab bars. Use non-skid mats or decals in the tub or shower. If you need to sit down in the shower, use a plastic, non-slip stool. Keep the floor dry. Clean up any water that spills on the floor as soon as it happens. Remove soap buildup in the tub or shower regularly. Attach bath mats securely with double-sided non-slip rug tape. Do not have throw rugs and other things on the floor that can make you trip. What can I do  in the bedroom? Use night lights. Make sure that you have a light by your bed that is easy to reach. Do not use any sheets or blankets that are too big for your bed. They should not hang down onto the floor. Have a firm chair that has side arms. You can use this for support while you get dressed. Do not have throw rugs and other things on the floor that can make you trip. What can I do in the kitchen? Clean up any spills right away. Avoid walking on wet floors. Keep items that you use a lot in easy-to-reach places. If you need to reach something above you, use a strong step stool that has a grab bar. Keep electrical cords out of the way. Do not use floor polish or wax that makes floors slippery. If you must use wax, use non-skid floor wax. Do not have throw rugs and other things on the floor that can make you trip. What can I do with my stairs? Do not leave any items on the stairs. Make sure that there are handrails on both sides of the stairs and use them. Fix handrails that are broken or loose. Make sure that handrails are as [place] as the stairways. Check any carpeting to make sure that it is firmly attached to the stairs. Fix any carpet that is loose or worn. Avoid having throw rugs at the top or bottom of  the stairs. If you do have throw rugs, attach them to the floor with carpet tape. Make sure that you have a light switch at the top of the stairs and the bottom of the stairs. If you do not have them, ask someone to add them for you. What else can I do to help prevent falls? Wear shoes that: Do not have high heels. Have rubber bottoms. Are comfortable and fit you well. Are closed at the toe. Do not wear sandals. If you use a stepladder: Make sure that it is fully opened. Do not climb a closed stepladder. Make sure that both sides of the stepladder are locked into place. Ask someone to hold it for you, if possible. Clearly mark and make sure that you can see: Any grab bars or  handrails. First and last steps. Where the edge of each step is. Use tools that help you move around (mobility aids) if they are needed. These include: Canes. Walkers. Scooters. Crutches. Turn on the lights when you go into a dark area. Replace any light bulbs as soon as they burn out. Set up your furniture so you have a clear path. Avoid moving your furniture around. If any of your floors are uneven, fix them. If there are any pets around you, be aware of where they are. Review your medicines with your doctor. Some medicines can make you feel dizzy. This can increase your chance of falling. Ask your doctor what other things that you can do to help prevent falls. This information is not intended to replace advice given to you by your health care provider. Make sure you discuss any questions you have with your health care provider. Document Released: 09/06/2009 Document Revised: 04/17/2016 Document Reviewed: 12/15/2014 Elsevier Interactive Patient Education  2017 [employer].

## 2022-02-03 ENCOUNTER — Other Ambulatory Visit: Payer: Self-pay | Admitting: Internal Medicine

## 2022-05-15 ENCOUNTER — Other Ambulatory Visit: Payer: Self-pay | Admitting: Internal Medicine

## 2022-05-15 DIAGNOSIS — E039 Hypothyroidism, unspecified: Secondary | ICD-10-CM

## 2022-07-29 ENCOUNTER — Ambulatory Visit (INDEPENDENT_AMBULATORY_CARE_PROVIDER_SITE_OTHER): Payer: Medicare HMO | Admitting: Internal Medicine

## 2022-07-29 ENCOUNTER — Encounter: Payer: Self-pay | Admitting: Internal Medicine

## 2022-07-29 VITALS — BP 132/66 | HR 71 | Temp 97.6°F | Resp 16 | Ht 67.0 in | Wt 135.0 lb

## 2022-07-29 DIAGNOSIS — N183 Chronic kidney disease, stage 3 unspecified: Secondary | ICD-10-CM | POA: Diagnosis not present

## 2022-07-29 DIAGNOSIS — D649 Anemia, unspecified: Secondary | ICD-10-CM | POA: Diagnosis not present

## 2022-07-29 DIAGNOSIS — E039 Hypothyroidism, unspecified: Secondary | ICD-10-CM

## 2022-07-29 LAB — CBC WITH DIFFERENTIAL/PLATELET
Basophils Absolute: 0.1 10*3/uL (ref 0.0–0.1)
Basophils Relative: 1 % (ref 0.0–3.0)
Eosinophils Absolute: 0.1 10*3/uL (ref 0.0–0.7)
Eosinophils Relative: 1.2 % (ref 0.0–5.0)
HCT: 36.1 % (ref 36.0–46.0)
Hemoglobin: 12.1 g/dL (ref 12.0–15.0)
Lymphocytes Relative: 29 % (ref 12.0–46.0)
Lymphs Abs: 1.7 10*3/uL (ref 0.7–4.0)
MCHC: 33.4 g/dL (ref 30.0–36.0)
MCV: 94.9 fl (ref 78.0–100.0)
Monocytes Absolute: 0.4 10*3/uL (ref 0.1–1.0)
Monocytes Relative: 6.6 % (ref 3.0–12.0)
Neutro Abs: 3.6 10*3/uL (ref 1.4–7.7)
Neutrophils Relative %: 62.2 % (ref 43.0–77.0)
Platelets: 298 10*3/uL (ref 150.0–400.0)
RBC: 3.8 Mil/uL — ABNORMAL LOW (ref 3.87–5.11)
RDW: 15 % (ref 11.5–15.5)
WBC: 5.8 10*3/uL (ref 4.0–10.5)

## 2022-07-29 LAB — BASIC METABOLIC PANEL
BUN: 30 mg/dL — ABNORMAL HIGH (ref 6–23)
CO2: 22 mEq/L (ref 19–32)
Calcium: 9.6 mg/dL (ref 8.4–10.5)
Chloride: 106 mEq/L (ref 96–112)
Creatinine, Ser: 1.9 mg/dL — ABNORMAL HIGH (ref 0.40–1.20)
GFR: 23.81 mL/min — ABNORMAL LOW (ref >60.00)
Glucose, Bld: 75 mg/dL (ref 70–99)
Potassium: 4.9 mEq/L (ref 3.5–5.1)
Sodium: 140 mEq/L (ref 135–145)

## 2022-07-29 LAB — TSH: TSH: 3.19 u[IU]/mL (ref 0.35–5.50)

## 2022-07-29 MED ORDER — SHINGRIX 50 MCG/0.5ML IM SUSR: 0.5000 mL | Freq: Once | INTRAMUSCULAR | 1 refills | Status: AC

## 2022-07-29 NOTE — Progress Notes (Unsigned)
Subjective:    Patient ID: [name], female    DOB: 1934/11/30, 87 y.o.   MRN: [id number]  DOS:  07/29/2022 Type of visit - description: f/u  Since the last office visit is feeling well. Occasionally left leg swelling some days.  Swelling goes away when she lays down. No chest pain or difficulty breathing No nausea or vomiting. No blood in the stool  Review of Systems See above   Past Medical History:  Diagnosis Date   Abnormal MRI of abdomen    ABNORMAL CT and MRI of pancreas, last MRI 1-09: after d/w GI we rec. to f/u clinically and redo XRs if problems     Anemia    CRI (chronic renal insufficiency)    elevated creatinine 4-11: stage 3 CKD, sees nephrology, history of solitary    Diverticulosis    Glaucoma    Gout    h/o    Hypertension    Hyperthyroidism    s/p ablation, Dr Debbora Presto    Past Surgical History:  Procedure Laterality Date   CATARACT EXTRACTION W/ INTRAOCULAR LENS  IMPLANT, BILATERAL  2006   COLONOSCOPY     ORIF TIBIA PLATEAU  10/24/2012   Procedure: OPEN REDUCTION INTERNAL FIXATION (ORIF) TIBIAL PLATEAU;  Surgeon: Jessy Oto, MD;  Location: [facility];  Service: Orthopedics;  Laterality: Left;    Current Outpatient Medications  Medication Instructions   acetaminophen (TYLENOL) 500-1,000 mg, Oral, Every 6 hours PRN   amLODipine (NORVASC) 10 MG tablet TAKE 1 TABLET EVERY DAY   ascorbic acid (VITAMIN C) 500 mg, Oral, Daily   levothyroxine (SYNTHROID) 75 MCG tablet TAKE 1 TABLET EVERY DAY FOR 6 DAYS OF THE WEEK (SKIP 1 DAY)   vitamin D3 (CHOLECALCIFEROL) 1,000 Units, Oral, Daily   Zoster Vaccine Adjuvanted (SHINGRIX) injection 0.5 mLs, Intramuscular,  Once       Objective:   Physical Exam BP 132/66   Pulse 71   Temp 97.6 F (36.4 C) (Oral)   Resp 16   Ht '5\' 7"'$  (1.702 m)   Wt 135 lb (61.2 kg)   SpO2 96%   BMI 21.14 kg/m  General:   Well developed, NAD, BMI noted. HEENT:  Normocephalic . Face symmetric, atraumatic Lungs:  CTA B Normal  respiratory effort, no intercostal retractions, no accessory muscle use. Heart: RRR,  no murmur.  Lower extremities: no pretibial edema bilaterally.  Skin: Not pale. Not jaundice Neurologic:  alert & oriented X3.  Speech normal, gait appropriate for age and unassisted Psych--  Cognition and judgment appear intact.  Cooperative with normal attention span and concentration.  Behavior appropriate. No anxious or depressed appearing.      Assessment    Assessment HTN CKD, stage III, Single kidney (absent L one); sees nephrology prn, last visit 2014. (Creatinine 1.8 = clearance ~ 30) Hypothyroidism:  s/p ablation, Dr. Chalmers Cater, no f/u schedule  Glaucoma H/o Gout Chronic anemia: Colonoscopy 08-2012, declines further screenings.  Normal ferritin before. H/o abnormal MRI abdomen , last MRI 2009, saw GI ---> follow up clinically, redo MRI prn Covid pneumonia, admitted 11-2019  PLAN:  R OV HTN: BP today is very good, at home is in the 140s.  Never higher than 145.  For now rec no change.  Avoid the risk of overcontrolled BP on this 87 year old lady.  Continue amlodipine. CKD, stage III: Check BMP, due to see renal, referral sent. Hypothyroidism: Synthroid, check TSH. Anemia: No GI symptoms, check CBC.  Last ferritin okay. Left leg  edema: Sporadic, mostly during the daytime, exam is benign.  Observation Preventive care: See AVS RTC CPX 11-2022  1- Here for CPX HTN: Saw cardiology 09/16/2021: Felt to be stable regards hypertension.  BP today is very good, continue amlodipine Syncope: Not discussed with cardiology at the October visit, no further episodes CKD: Follow-up by nephrology, they order iron infusion recently. Chronic anemia: See above Hypothyroidism: On Synthroid, check TSH RTC 6 to 8 months

## 2022-07-29 NOTE — Patient Instructions (Addendum)
Recommend to proceed with the following vaccines at your pharmacy:  Flu shot- high dose Covid booster (bivalent)  Shingrix (shingles)  Check the  blood pressure regularly BP GOAL is between 110/65 and  145/85. If it is consistently higher or lower, let me know  GO TO THE LAB : Get the blood work     [place], [place] back for a physical exam by January 2024

## 2022-07-30 NOTE — Assessment & Plan Note (Signed)
ROV HTN: BP today is very good, at home is in the 140s.  Never higher than 145.  For now rec no change, will avoid the risk of overcontrolled BP on this 87 year old lady.  Continue amlodipine. CKD, stage III: Check BMP, due to see renal, referral sent. Hypothyroidism: Synthroid, check TSH. Anemia: No GI symptoms, check CBC.  Last ferritin okay. Left leg edema: Sporadic, mostly during the daytime, exam is benign.  Observation Preventive care: See AVS RTC CPX 11-2022

## 2022-09-05 DIAGNOSIS — N390 Urinary tract infection, site not specified: Secondary | ICD-10-CM | POA: Diagnosis not present

## 2022-09-05 DIAGNOSIS — D631 Anemia in chronic kidney disease: Secondary | ICD-10-CM | POA: Diagnosis not present

## 2022-09-05 DIAGNOSIS — N1832 Chronic kidney disease, stage 3b: Secondary | ICD-10-CM | POA: Diagnosis not present

## 2022-09-05 DIAGNOSIS — I129 Hypertensive chronic kidney disease with stage 1 through stage 4 chronic kidney disease, or unspecified chronic kidney disease: Secondary | ICD-10-CM | POA: Diagnosis not present

## 2022-09-05 DIAGNOSIS — N189 Chronic kidney disease, unspecified: Secondary | ICD-10-CM | POA: Diagnosis not present

## 2022-09-05 LAB — PROTEIN / CREATININE RATIO, URINE: Creatinine, Urine: 142.6

## 2022-09-05 LAB — IRON,TIBC AND FERRITIN PANEL
%SAT: 26
Ferritin: 333
Iron: 57
TIBC: 223
UIBC: 166

## 2022-09-05 LAB — BASIC METABOLIC PANEL
BUN: 28 — AB (ref 4–21)
CO2: 19 (ref 13–22)
Chloride: 108 (ref 99–108)
Creatinine: 1.9 — AB (ref 0.5–1.1)
Glucose: 91
Potassium: 4.4 mEq/L (ref 3.5–5.1)
Sodium: 144 (ref 137–147)

## 2022-09-05 LAB — CBC AND DIFFERENTIAL: Hemoglobin: 11.9 — AB (ref 12.0–16.0)

## 2022-09-05 LAB — COMPREHENSIVE METABOLIC PANEL
Albumin: 4 (ref 3.5–5.0)
Calcium: 9.7 (ref 8.7–10.7)
eGFR: 25

## 2022-09-05 LAB — VITAMIN D 25 HYDROXY (VIT D DEFICIENCY, FRACTURES): Vit D, 25-Hydroxy: 28.4

## 2022-09-15 ENCOUNTER — Encounter: Payer: Self-pay | Admitting: Internal Medicine

## 2022-09-22 ENCOUNTER — Other Ambulatory Visit: Payer: Self-pay | Admitting: Internal Medicine

## 2023-01-01 ENCOUNTER — Ambulatory Visit (INDEPENDENT_AMBULATORY_CARE_PROVIDER_SITE_OTHER): Payer: Medicare HMO | Admitting: *Deleted

## 2023-01-01 DIAGNOSIS — Z0000 Encounter for general adult medical examination without abnormal findings: Secondary | ICD-10-CM | POA: Diagnosis not present

## 2023-01-01 NOTE — Patient Instructions (Signed)
[name] , Thank you for taking time to come for your Medicare Wellness Visit. I appreciate your ongoing commitment to your health goals. Please review the following plan we discussed and let me know if I can assist you in the future.   These are the goals we discussed:  Goals       Maintain current health (pt-stated)      Patient Stated      Drink more water        This is a list of the screening recommended for you and due dates:  Health Maintenance  Topic Date Due   Zoster (Shingles) Vaccine (1 of 2) Never done   COVID-19 Vaccine (4 - 2023-24 season) 07/25/2022   DTaP/Tdap/Td vaccine (3 - Td or Tdap) 12/30/2023   Medicare Annual Wellness Visit  01/02/2024   Pneumonia Vaccine  Completed   Flu Shot  Completed   HPV Vaccine  Aged Out   DEXA scan (bone density measurement)  Discontinued     Next appointment: Follow up in one year for your annual wellness visit.   Preventive Care 67 Years and Older, Female Preventive care refers to lifestyle choices and visits with your health care provider that can promote health and wellness. What does preventive care include? A yearly physical exam. This is also called an annual well check. Dental exams once or twice a year. Routine eye exams. Ask your health care provider how often you should have your eyes checked. Personal lifestyle choices, including: Daily care of your teeth and gums. Regular physical activity. Eating a healthy diet. Avoiding tobacco and drug use. Limiting alcohol use. Practicing safe sex. Taking low-dose aspirin every day. Taking vitamin and mineral supplements as recommended by your health care provider. What happens during an annual well check? The services and screenings done by your health care provider during your annual well check will depend on your age, overall health, lifestyle risk factors, and family history of disease. Counseling  Your health care provider may ask you questions about your: Alcohol  use. Tobacco use. Drug use. Emotional well-being. Home and relationship well-being. Sexual activity. Eating habits. History of falls. Memory and ability to understand (cognition). Work and work Statistician. Reproductive health. Screening  You may have the following tests or measurements: Height, weight, and BMI. Blood pressure. Lipid and cholesterol levels. These may be checked every 5 years, or more frequently if you are over 55 years old. Skin check. Lung cancer screening. You may have this screening every year starting at age 35 if you have a 30-pack-year history of smoking and currently smoke or have quit within the past 15 years. Fecal occult blood test (FOBT) of the stool. You may have this test every year starting at age 86. Flexible sigmoidoscopy or colonoscopy. You may have a sigmoidoscopy every 5 years or a colonoscopy every 10 years starting at age 105. Hepatitis C blood test. Hepatitis B blood test. Sexually transmitted disease (STD) testing. Diabetes screening. This is done by checking your blood sugar (glucose) after you have not eaten for a while (fasting). You may have this done every 1-3 years. Bone density scan. This is done to screen for osteoporosis. You may have this done starting at age 86. Mammogram. This may be done every 1-2 years. Talk to your health care provider about how often you should have regular mammograms. Talk with your health care provider about your test results, treatment options, and if necessary, the need for more tests. Vaccines  Your health  care provider may recommend certain vaccines, such as: Influenza vaccine. This is recommended every year. Tetanus, diphtheria, and acellular pertussis (Tdap, Td) vaccine. You may need a Td booster every 10 years. Zoster vaccine. You may need this after age 27. Pneumococcal 13-valent conjugate (PCV13) vaccine. One dose is recommended after age 76. Pneumococcal polysaccharide (PPSV23) vaccine. One dose is  recommended after age 51. Talk to your health care provider about which screenings and vaccines you need and how often you need them. This information is not intended to replace advice given to you by your health care provider. Make sure you discuss any questions you have with your health care provider. Document Released: 12/07/2015 Document Revised: 07/30/2016 Document Reviewed: 09/11/2015 Elsevier Interactive Patient Education  2017 [place] Prevention in the Home Falls can cause injuries. They can happen to people of all ages. There are many things you can do to make your home safe and to help prevent falls. What can I do on the outside of my home? Regularly fix the edges of walkways and driveways and fix any cracks. Remove anything that might make you trip as you walk through a door, such as a raised step or threshold. Trim any bushes or trees on the path to your home. Use bright outdoor lighting. Clear any walking paths of anything that might make someone trip, such as rocks or tools. Regularly check to see if handrails are loose or broken. Make sure that both sides of any steps have handrails. Any raised decks and porches should have guardrails on the edges. Have any leaves, snow, or ice cleared regularly. Use sand or salt on walking paths during winter. Clean up any spills in your garage right away. This includes oil or grease spills. What can I do in the bathroom? Use night lights. Install grab bars by the toilet and in the tub and shower. Do not use towel bars as grab bars. Use non-skid mats or decals in the tub or shower. If you need to sit down in the shower, use a plastic, non-slip stool. Keep the floor dry. Clean up any water that spills on the floor as soon as it happens. Remove soap buildup in the tub or shower regularly. Attach bath mats securely with double-sided non-slip rug tape. Do not have throw rugs and other things on the floor that can make you  trip. What can I do in the bedroom? Use night lights. Make sure that you have a light by your bed that is easy to reach. Do not use any sheets or blankets that are too big for your bed. They should not hang down onto the floor. Have a firm chair that has side arms. You can use this for support while you get dressed. Do not have throw rugs and other things on the floor that can make you trip. What can I do in the kitchen? Clean up any spills right away. Avoid walking on wet floors. Keep items that you use a lot in easy-to-reach places. If you need to reach something above you, use a strong step stool that has a grab bar. Keep electrical cords out of the way. Do not use floor polish or wax that makes floors slippery. If you must use wax, use non-skid floor wax. Do not have throw rugs and other things on the floor that can make you trip. What can I do with my stairs? Do not leave any items on the stairs. Make sure that there are handrails on  both sides of the stairs and use them. Fix handrails that are broken or loose. Make sure that handrails are as [place] as the stairways. Check any carpeting to make sure that it is firmly attached to the stairs. Fix any carpet that is loose or worn. Avoid having throw rugs at the top or bottom of the stairs. If you do have throw rugs, attach them to the floor with carpet tape. Make sure that you have a light switch at the top of the stairs and the bottom of the stairs. If you do not have them, ask someone to add them for you. What else can I do to help prevent falls? Wear shoes that: Do not have high heels. Have rubber bottoms. Are comfortable and fit you well. Are closed at the toe. Do not wear sandals. If you use a stepladder: Make sure that it is fully opened. Do not climb a closed stepladder. Make sure that both sides of the stepladder are locked into place. Ask someone to hold it for you, if possible. Clearly mark and make sure that you can  see: Any grab bars or handrails. First and last steps. Where the edge of each step is. Use tools that help you move around (mobility aids) if they are needed. These include: Canes. Walkers. Scooters. Crutches. Turn on the lights when you go into a dark area. Replace any light bulbs as soon as they burn out. Set up your furniture so you have a clear path. Avoid moving your furniture around. If any of your floors are uneven, fix them. If there are any pets around you, be aware of where they are. Review your medicines with your doctor. Some medicines can make you feel dizzy. This can increase your chance of falling. Ask your doctor what other things that you can do to help prevent falls. This information is not intended to replace advice given to you by your health care provider. Make sure you discuss any questions you have with your health care provider. Document Released: 09/06/2009 Document Revised: 04/17/2016 Document Reviewed: 12/15/2014 Elsevier Interactive Patient Education  2017 [employer].

## 2023-01-01 NOTE — Progress Notes (Signed)
Subjective:   [name] is a 88 y.o. female who presents for Medicare Annual (Subsequent) preventive examination.  I connected with  [name] on 01/01/23 by a audio enabled telemedicine application and verified that I am speaking with the correct person using two identifiers.  Patient Location: Home  Provider Location: Office/Clinic  I discussed the limitations of evaluation and management by telemedicine. The patient expressed understanding and agreed to proceed.   Review of Systems    Defer to PCP Cardiac Risk Factors include: advanced age (>65mn, >>83women);hypertension     Objective:    There were no vitals filed for this visit. There is no height or weight on file to calculate BMI.     01/01/2023   11:00 AM 12/31/2021    1:04 PM 09/26/2019   10:53 AM 09/17/2018   11:16 AM 09/16/2017   11:24 AM 01/26/2017    1:14 PM 10/24/2012    8:00 AM  Advanced Directives  Does Patient Have a Medical Advance Directive? No Yes No No No No Patient does not have advance directive  Does patient want to make changes to medical advance directive?  Yes (MAU/Ambulatory/Procedural Areas - Information given)       Would patient like information on creating a medical advance directive? No - Patient declined  No - Patient declined Yes (MAU/Ambulatory/Procedural Areas - Information given) Yes (MAU/Ambulatory/Procedural Areas - Information given);No - Patient declined No - Patient declined   Pre-existing out of facility DNR order (yellow form or pink MOST form)       No    Current Medications (verified) Outpatient Encounter Medications as of 01/01/2023  Medication Sig   acetaminophen (TYLENOL) 500 MG tablet Take 500-1,000 mg by mouth every 6 (six) hours as needed for fever or headache (pain).   amLODipine (NORVASC) 10 MG tablet Take 1 tablet (10 mg total) by mouth daily.   ascorbic acid (VITAMIN C) 500 MG tablet Take 1 tablet (500 mg total) by mouth daily.   cholecalciferol (VITAMIN D) 25  MCG tablet Take 1 tablet (1,000 Units total) by mouth daily.   levothyroxine (SYNTHROID) 75 MCG tablet TAKE 1 TABLET EVERY DAY FOR 6 DAYS OF THE WEEK (SKIP 1 DAY)   No facility-administered encounter medications on file as of 01/01/2023.    Allergies (verified) Patient has no known allergies.   History: Past Medical History:  Diagnosis Date   Abnormal MRI of abdomen    ABNORMAL CT and MRI of pancreas, last MRI 1-09: after d/w GI we rec. to f/u clinically and redo XRs if problems     Anemia    CRI (chronic renal insufficiency)    elevated creatinine 4-11: stage 3 CKD, sees nephrology, history of solitary    Diverticulosis    Glaucoma    Gout    h/o    Hypertension    Hyperthyroidism    s/p ablation, Dr BDebbora Presto  Past Surgical History:  Procedure Laterality Date   CATARACT EXTRACTION W/ INTRAOCULAR LENS  IMPLANT, BILATERAL  2006   COLONOSCOPY     ORIF TIBIA PLATEAU  10/24/2012   Procedure: OPEN REDUCTION INTERNAL FIXATION (ORIF) TIBIAL PLATEAU;  Surgeon: JJessy Oto MD;  Location: [facility];  Service: Orthopedics;  Laterality: Left;   Family History  Problem Relation Age of Onset   Heart attack Sister    Colon cancer Sister 548  Cancer Sister    Heart attack Brother    Pancreatic cancer Brother  Diabetes Mother    Esophageal cancer Neg Hx    Stomach cancer Neg Hx    Rectal cancer Neg Hx    Breast cancer Neg Hx    Social History   Socioeconomic History   Marital status: Widowed    Spouse name: Not on file   Number of children: 1   Years of education: Not on file   Highest education level: Not on file  Occupational History   Occupation: retired , Scientist, water quality   Tobacco Use   Smoking status: Never   Smokeless tobacco: Never  Substance and Sexual Activity   Alcohol use: No   Drug use: No   Sexual activity: Not on file  Other Topics Concern   Not on file  Social History Narrative   Divorced, used to live with her sister, died ~ Oct 16, 2019     Independent, drives     Household: pt and a niece   Has one daughter and another sister who lives here in town   Social Determinants of Health   Financial Resource Strain: [place]  (12/31/2021)   Overall Financial Resource Strain ([place])    Difficulty of Paying Living Expenses: Not hard at all  Food Insecurity: No [place] (01/01/2023)   Hunger Vital Sign    Worried About Running Out of Food in the Last Year: Never true    [place] in the Last Year: Never true  Transportation Needs: No Transportation Needs (01/01/2023)   PRAPARE - Hydrologist (Medical): No    Lack of Transportation (Non-Medical): No  Physical Activity: Inactive (12/31/2021)   Exercise Vital Sign    Days of Exercise per Week: 0 days    Minutes of Exercise per Session: 0 min  Stress: No Stress Concern Present (12/31/2021)   [place]    Feeling of Stress : Not at all  Social Connections: Moderately Integrated (12/31/2021)   Social Connection and Isolation Panel [NHANES]    Frequency of Communication with Friends and Family: More than three times a week    Frequency of Social Gatherings with Friends and Family: More than three times a week    Attends Religious Services: More than 4 times per year    Active Member of [employer] or Organizations: Yes    Attends Archivist Meetings: More than 4 times per year    Marital Status: Widowed    Tobacco Counseling Counseling given: Not Answered   Clinical Intake:  Pre-visit preparation completed: Yes  Pain : No/denies pain  Diabetes: No  How often do you need to have someone help you when you read instructions, pamphlets, or other written materials from your doctor or pharmacy?: 1 - Never   Activities of Daily Living    01/01/2023   11:03 AM  In your present state of health, do you have any difficulty performing the following activities:  Hearing? 1  Comment noticing some slight  difficulty hearing  Vision? 0  Difficulty concentrating or making decisions? 0  Walking or climbing stairs? 0  Dressing or bathing? 0  Doing errands, shopping? 1  Preparing Food and eating ? N  Using the Toilet? N  In the past six months, have you accidently leaked urine? N  Do you have problems with loss of bowel control? N  Managing your Medications? N  Managing your Finances? N  Housekeeping or managing your Housekeeping? N    Patient Care Team:  Colon Branch, MD as PCP - General Berniece Salines, DO as PCP - Cardiology (Cardiology) Monna Fam, MD as Consulting Physician (Ophthalmology) Jacelyn Pi, MD as Consulting Physician (Endocrinology) Corliss Parish, MD as Consulting Physician (Nephrology)  Indicate any recent Medical Services you may have received from other than [facility] providers in the past year (date may be approximate).     Assessment:   This is a routine wellness examination for [place].  Hearing/Vision screen No results found.  Dietary issues and exercise activities discussed: Current Exercise Habits: The patient does not participate in regular exercise at present, Exercise limited by: None identified   Goals Addressed             This Visit's Progress    Patient Stated   Not on track    Drink more water       Depression Screen    01/01/2023   11:02 AM 07/29/2022   11:05 AM 12/31/2021    1:08 PM 11/27/2021   10:52 AM 06/26/2021   11:00 AM 03/26/2021   11:06 AM 01/07/2021   10:59 AM  PHQ 2/9 Scores  PHQ - 2 Score 0 0 0 0 0 0 0    Fall Risk    01/01/2023   11:00 AM 07/29/2022   11:04 AM 12/31/2021    1:06 PM 11/27/2021   10:52 AM 06/26/2021   11:01 AM  Fall Risk   Falls in the past year? 0 0 0 0 0  Number falls in past yr: 0 0 0 0 0  Injury with Fall? 0 0 0 0 0  Risk for fall due to : No Fall Risks      Follow up Falls evaluation completed Falls evaluation completed Falls prevention discussed Falls evaluation completed Falls evaluation completed     [place]:  Any stairs in or around the home? Yes  If so, are there any without handrails? No  Home free of loose throw rugs in walkways, pet beds, electrical cords, etc? Yes  Adequate lighting in your home to reduce risk of falls? Yes   ASSISTIVE DEVICES UTILIZED TO PREVENT FALLS:  Life alert? No  Use of a cane, walker or w/c? No  Grab bars in the bathroom? Yes  Shower chair or bench in shower? No  Elevated toilet seat or a handicapped toilet? Yes   TIMED UP AND GO:  Was the test performed?  No, audio visit .    Cognitive Function:    09/16/2017   11:26 AM  MMSE - Mini Mental State Exam  Orientation to time 5  Orientation to Place 5  Registration 3  Attention/ Calculation 5  Recall 2  Language- name 2 objects 2  Language- repeat 1  Language- follow 3 step command 3  Language- read & follow direction 1  Write a sentence 1  Copy design 1  Total score 29        01/01/2023   11:07 AM 12/31/2021    1:13 PM 09/26/2019   10:57 AM  6CIT Screen  What Year? 0 points 0 points 0 points  What month? 0 points 0 points 0 points  What time? 0 points 0 points   Count back from 20 0 points 0 points 0 points  Months in reverse 0 points 0 points 0 points  Repeat phrase 0 points 0 points 0 points  Total Score 0 points 0 points     Immunizations Immunization History  Administered Date(s) Administered  Fluad Quad(high Dose 65+) 09/26/2019, 11/27/2020   Influenza, High Dose Seasonal PF 10/02/2016, 09/16/2017, 09/17/2018   Influenza-Unspecified 10/10/2021, 09/05/2022   PFIZER(Purple Top)SARS-COV-2 Vaccination 01/20/2020, 02/14/2020, 10/11/2020   PNEUMOCOCCAL CONJUGATE-20 11/27/2021   Pneumococcal Conjugate-13 03/30/2015   Pneumococcal Polysaccharide-23 12/29/2013   Td 03/26/2010   Tdap 12/29/2013    TDAP status: Up to date  Flu Vaccine status: Up to date  Pneumococcal vaccine status: Up to date  Covid-19 vaccine status: Information  provided on how to obtain vaccines.   Qualifies for Shingles Vaccine? Yes   Zostavax completed No   Shingrix Completed?: No.    Education has been provided regarding the importance of this vaccine. Patient has been advised to call insurance company to determine out of pocket expense if they have not yet received this vaccine. Advised may also receive vaccine at local pharmacy or Health Dept. Verbalized acceptance and understanding.  Screening Tests Health Maintenance  Topic Date Due   Zoster Vaccines- Shingrix (1 of 2) Never done   COVID-19 Vaccine (4 - 2023-24 season) 07/25/2022   Medicare Annual Wellness (AWV)  12/31/2022   DTaP/Tdap/Td (3 - Td or Tdap) 12/30/2023   Pneumonia Vaccine 22+ Years old  Completed   INFLUENZA VACCINE  Completed   HPV VACCINES  Aged Out   DEXA SCAN  Discontinued    Health Maintenance  Health Maintenance Due  Topic Date Due   Zoster Vaccines- Shingrix (1 of 2) Never done   COVID-19 Vaccine (4 - 2023-24 season) 07/25/2022   Medicare Annual Wellness (AWV)  12/31/2022    Colorectal cancer screening: No longer required.   Mammogram status: No longer required due to pt preference.  Bone Density status: pt declined.  Lung Cancer Screening: (Low Dose CT Chest recommended if Age 4-80 years, 30 pack-year currently smoking OR have quit w/in 15years.) does not qualify.   Additional Screening:  Hepatitis C Screening: does not qualify  Vision Screening: Recommended annual ophthalmology exams for early detection of glaucoma and other disorders of the eye. Is the patient up to date with their annual eye exam?  No  Who is the provider or what is the name of the office in which the patient attends annual eye exams? Doesn't remember name If pt is not established with a provider, would they like to be referred to a provider to establish care? No .   Dental Screening: Recommended annual dental exams for proper oral hygiene  Community Resource Referral / Chronic  Care Management: CRR required this visit?  No   CCM required this visit?  No      Plan:     I have personally reviewed and noted the following in the patient's chart:   Medical and social history Use of alcohol, tobacco or illicit drugs  Current medications and supplements including opioid prescriptions. Patient is not currently taking opioid prescriptions. Functional ability and status Nutritional status Physical activity Advanced directives List of other physicians Hospitalizations, surgeries, and ER visits in previous 12 months Vitals Screenings to include cognitive, depression, and falls Referrals and appointments  In addition, I have reviewed and discussed with patient certain preventive protocols, quality metrics, and best practice recommendations. A written personalized care plan for preventive services as well as general preventive health recommendations were provided to patient.   Due to this being a telephonic visit, the after visit summary with patients personalized plan was offered to patient via mail or my-chart. Per request, patient was mailed a copy of AVS.  Rebeca Alert, Bular Hickok, CMA  01/01/2023   Nurse Notes: None

## 2023-01-02 NOTE — Progress Notes (Signed)
I have reviewed and agree with [facility] documentation.  Kathlene November, MD

## 2023-03-09 ENCOUNTER — Other Ambulatory Visit: Payer: Self-pay | Admitting: Internal Medicine

## 2023-03-17 DIAGNOSIS — N189 Chronic kidney disease, unspecified: Secondary | ICD-10-CM | POA: Diagnosis not present

## 2023-03-17 DIAGNOSIS — D631 Anemia in chronic kidney disease: Secondary | ICD-10-CM | POA: Diagnosis not present

## 2023-03-17 DIAGNOSIS — N1832 Chronic kidney disease, stage 3b: Secondary | ICD-10-CM | POA: Diagnosis not present

## 2023-03-17 DIAGNOSIS — I129 Hypertensive chronic kidney disease with stage 1 through stage 4 chronic kidney disease, or unspecified chronic kidney disease: Secondary | ICD-10-CM | POA: Diagnosis not present

## 2023-03-17 DIAGNOSIS — N390 Urinary tract infection, site not specified: Secondary | ICD-10-CM | POA: Diagnosis not present

## 2023-03-17 LAB — CBC AND DIFFERENTIAL: Hemoglobin: 11.5 — AB (ref 12.0–16.0)

## 2023-03-17 LAB — BASIC METABOLIC PANEL
BUN: 35 — AB (ref 4–21)
CO2: 18 (ref 13–22)
Chloride: 105 (ref 99–108)
Creatinine: 2.1 — AB (ref 0.5–1.1)
Glucose: 81
Potassium: 4.9 mEq/L (ref 3.5–5.1)
Sodium: 138 (ref 137–147)

## 2023-03-17 LAB — IRON,TIBC AND FERRITIN PANEL
%SAT: 31
Ferritin: 330
Iron: 75
TIBC: 239
UIBC: 164

## 2023-03-17 LAB — COMPREHENSIVE METABOLIC PANEL
Albumin: 4.3 (ref 3.5–5.0)
Calcium: 10 (ref 8.7–10.7)
eGFR: 22

## 2023-03-17 LAB — PROTEIN / CREATININE RATIO, URINE: Creatinine, Urine: 123

## 2023-03-17 LAB — VITAMIN D 25 HYDROXY (VIT D DEFICIENCY, FRACTURES): Vit D, 25-Hydroxy: 30.5

## 2023-03-18 LAB — LAB REPORT - SCANNED
Creatinine, POC: 123 mg/dL
EGFR: 22

## 2023-03-25 ENCOUNTER — Encounter: Payer: Self-pay | Admitting: Internal Medicine

## 2023-03-26 DIAGNOSIS — N1832 Chronic kidney disease, stage 3b: Secondary | ICD-10-CM | POA: Diagnosis not present

## 2023-03-31 ENCOUNTER — Encounter: Payer: Self-pay | Admitting: Internal Medicine

## 2023-03-31 ENCOUNTER — Ambulatory Visit (INDEPENDENT_AMBULATORY_CARE_PROVIDER_SITE_OTHER): Payer: Medicare HMO | Admitting: Internal Medicine

## 2023-03-31 VITALS — BP 132/72 | HR 66 | Temp 97.6°F | Resp 16 | Ht 67.0 in | Wt 132.1 lb

## 2023-03-31 DIAGNOSIS — E039 Hypothyroidism, unspecified: Secondary | ICD-10-CM | POA: Diagnosis not present

## 2023-03-31 DIAGNOSIS — Z0000 Encounter for general adult medical examination without abnormal findings: Secondary | ICD-10-CM

## 2023-03-31 LAB — TSH: TSH: 2.11 u[IU]/mL (ref 0.35–5.50)

## 2023-03-31 NOTE — Progress Notes (Signed)
Subjective:    Patient ID: [name], female    DOB: 05/17/1935, 88 y.o.   MRN: [id number]  DOS:  03/31/2023 Type of visit - description: cpx  Here for CPX Denies any new symptoms or concerns. Recently got hearing aids.   Review of Systems   A 14 point review of systems is negative    Past Medical History:  Diagnosis Date   Abnormal MRI of abdomen    ABNORMAL CT and MRI of pancreas, last MRI 1-09: after d/w GI we rec. to f/u clinically and redo XRs if problems     Anemia    CRI (chronic renal insufficiency)    elevated creatinine 4-11: stage 3 CKD, sees nephrology, history of solitary    Diverticulosis    Glaucoma    Gout    h/o    Hypertension    Hyperthyroidism    s/p ablation, Dr Lisabeth Devoid    Past Surgical History:  Procedure Laterality Date   CATARACT EXTRACTION W/ INTRAOCULAR LENS  IMPLANT, BILATERAL  2006   COLONOSCOPY     ORIF TIBIA PLATEAU  10/24/2012   Procedure: OPEN REDUCTION INTERNAL FIXATION (ORIF) TIBIAL PLATEAU;  Surgeon: Kerrin Champagne, MD;  Location: [facility];  Service: Orthopedics;  Laterality: Left;   Social History   Socioeconomic History   Marital status: Widowed    Spouse name: Not on file   Number of children: 1   Years of education: Not on file   Highest education level: Not on file  Occupational History   Occupation: retired , Conservation officer, nature   Tobacco Use   Smoking status: Never   Smokeless tobacco: Never  Substance and Sexual Activity   Alcohol use: No   Drug use: No   Sexual activity: Not on file  Other Topics Concern   Not on file  Social History Narrative   Divorced, used to live with her sister, died ~ 03-Oct-2019     Independent, drives    Household: pt and a niece   Has one daughter and another sister who lives here in town   Social Determinants of Health   Financial Resource Strain: Low Risk  (12/31/2021)   Overall Financial Resource Strain ([place]ARD[place])    Difficulty of Paying Living Expenses: Not hard at all  Food Insecurity: No  Food Insecurity (01/01/2023)   Hunger Vital Sign    Worried About Running Out of Food in the Last Year: Never true    Ran Out of Food in the Last Year: Never true  Transportation Needs: No Transportation Needs (01/01/2023)   PRAPARE - Administrator, Civil Service (Medical): No    Lack of Transportation (Non-Medical): No  Physical Activity: Inactive (12/31/2021)   Exercise Vital Sign    Days of Exercise per Week: 0 days    Minutes of Exercise per Session: 0 min  Stress: No Stress Concern Present (12/31/2021)   [employer] of Occupational Health - Occupational Stress Questionnaire    Feeling of Stress : Not at all  Social Connections: Moderately Integrated (12/31/2021)   Social Connection and Isolation Panel [NHANES]    Frequency of Communication with Friends and Family: More than three times a week    Frequency of Social Gatherings with Friends and Family: More than three times a week    Attends Religious Services: More than 4 times per year    Active Member of [employer] or Organizations: Yes    Attends Banker Meetings: More than 4 times  per year    Marital Status: Widowed  Intimate Partner Violence: Not At Risk (01/01/2023)   Humiliation, Afraid, Rape, and Kick questionnaire    Fear of Current or Ex-Partner: No    Emotionally Abused: No    Physically Abused: No    Sexually Abused: No    Current Outpatient Medications  Medication Instructions   acetaminophen (TYLENOL) 500-1,000 mg, Oral, Every 6 hours PRN   amLODipine (NORVASC) 10 mg, Oral, Daily   ascorbic acid (VITAMIN C) 500 mg, Oral, Daily   levothyroxine (SYNTHROID) 75 MCG tablet TAKE 1 TABLET EVERY DAY FOR 6 DAYS OF THE WEEK (SKIP 1 DAY)   vitamin D3 (CHOLECALCIFEROL) 1,000 Units, Oral, Daily       Objective:   Physical Exam BP 132/72   Pulse 66   Temp 97.6 F (36.4 C) (Oral)   Resp 16   Ht 5\' 7"  (1.702 m)   Wt 132 lb 2 oz (59.9 kg)   SpO2 97%   BMI 20.69 kg/m  General:   Well developed, NAD,  BMI noted. HEENT:  Normocephalic . Face symmetric, atraumatic Neck: Normal carotid pulses, no bruit Lungs:  CTA B Normal respiratory effort, no intercostal retractions, no accessory muscle use. Heart: RRR,  no murmur.  Lower extremities: no pretibial edema bilaterally  Skin: Not pale. Not jaundice Neurologic:  alert & oriented X3.  Speech normal, gait appropriate for age and unassisted Psych--  Cognition and judgment appear intact.  Cooperative with normal attention span and concentration.  Behavior appropriate. No anxious or depressed appearing.      Assessment     Assessment HTN CKD, stage III, Single kidney (absent L one); sees nephrology prn, last visit 2014. (Creatinine 1.8 = clearance ~ 30) Hypothyroidism:  s/p ablation, Dr. Talmage Nap, no f/u schedule  Glaucoma H/o Gout Chronic anemia: Colonoscopy 08-2012, declines further screenings.  Normal ferritin before. H/o abnormal MRI abdomen , last MRI 2009, saw GI ---> follow up clinically, redo MRI prn HOH- hearing aids rx 2024   PLAN:  Here for CPX Recent labs done at nephrology reviewed HTN: On amlodipine, well-controlled, reports normal ambulatory BPs. CKD: Per nephrology Hypothyroidism: On Synthroid, check TSH RTC 6 to 8 months

## 2023-03-31 NOTE — Assessment & Plan Note (Signed)
Here for CPX Recent labs done at nephrology reviewed HTN: On amlodipine, well-controlled, reports normal ambulatory BPs. CKD: Per nephrology Hypothyroidism: On Synthroid, check TSH RTC 6 to 8 months

## 2023-03-31 NOTE — Assessment & Plan Note (Signed)
-  Td: 2015 - PNM 23: 2015; prevnar : 2016; PNM 20: 2023 -Vaccines I recommend: Shingrix, COVID booster if not done by 07-2022, RSV, flu shot every fall - Colon, cervical and breast cancer screenings: Patient elected to stop screenings, see previous entries -Off aspirin due to age - DEXA: Has declined consistently -diet and  exercise discussed  - Healthcare POA: Information provided, see AVS - Labs: Recently done at nephrology, will only check a TSH today. - Check FLP?  Literature reviewed, at this point screening for high cholesterol is not indicated.

## 2023-03-31 NOTE — Patient Instructions (Addendum)
Vaccines I recommend: Covid booster Shingrix (shingles) RSV Flu shot every fall  Continue checking your blood pressures regularly BP GOAL is between 110/65 and  135/85. If it is consistently higher or lower, let me know     GO TO THE LAB : Get the blood work     GO TO THE FRONT DESK, PLEASE SCHEDULE YOUR APPOINTMENTS Come back for a checkup in 6 to 54-month    "Health Care Power of attorney" ,  "Living will" (Advance care planning documents)  If you already have a living will or healthcare power of attorney, is recommended you bring the copy to be scanned in your chart.   The document will be available to all the doctors you see in the system.  Advance care planning is a process that supports adults in  understanding and sharing their preferences regarding future medical care.  The patient's preferences are recorded in documents called Advance Directives and the can be modified at any time while the patient is in full mental capacity.   If you don't have one, please consider create one.      More information at: [email or web address]

## 2023-05-15 ENCOUNTER — Ambulatory Visit
Admission: EM | Admit: 2023-05-15 | Discharge: 2023-05-15 | Disposition: A | Discharge status: Home / Self Care | Payer: Medicare HMO | Attending: Family Medicine | Admitting: Family Medicine

## 2023-05-15 DIAGNOSIS — L309 Dermatitis, unspecified: Secondary | ICD-10-CM | POA: Diagnosis not present

## 2023-05-15 MED ORDER — DEXAMETHASONE SODIUM PHOSPHATE 10 MG/ML IJ SOLN
5.0000 mg | Freq: Once | INTRAMUSCULAR | Status: AC
  Administered 2023-05-15: 5 mg via INTRAMUSCULAR

## 2023-05-15 MED ORDER — TRIAMCINOLONE ACETONIDE 0.025 % EX CREA: 1.0000 | TOPICAL_CREAM | Freq: Two times a day (BID) | CUTANEOUS | 0 refills | Status: DC | PRN

## 2023-05-15 NOTE — ED Provider Notes (Signed)
[facility]    CSN: [id number] Arrival date & time: 05/15/23  1622      History   Chief Complaint No chief complaint on file.   HPI [name] is a 88 y.o. female.   HPI Patient with a history of CKD, hypertension, and diverticulitis presents today for evaluation of a rash initially developed on her chest and now has spread to her arms and abdomen.  Reports that rash is pruritic and she has applied over-the-counter lotions without any relief of symptoms.  Became concerned due to the rash is now spreading.  She denies any history of eczema or previous episodes of dermatitis.  She does not have any pets and denies any exposure to any outdoor irritants.  Of note she reports she did change her laundry detergent to a different type about a week ago uncertain if this may be contributing to the rash. She denies any other concerns.   Past Medical History:  Diagnosis Date   Abnormal MRI of abdomen    ABNORMAL CT and MRI of pancreas, last MRI 1-09: after d/w GI we rec. to f/u clinically and redo XRs if problems     Anemia    CRI (chronic renal insufficiency)    elevated creatinine 4-11: stage 3 CKD, sees nephrology, history of solitary    Diverticulosis    Glaucoma    Gout    h/o    Hypertension    Hyperthyroidism    s/p ablation, Dr Lisabeth Devoid    Patient Active Problem List   Diagnosis Date Noted   Hyperthyroidism    Hypertension    Diverticulosis    CRI (chronic renal insufficiency)    AKI (acute kidney injury) (HCC) 12/04/2019   Acute respiratory disease due to COVID-19 virus 12/04/2019   Pneumonia due to COVID-19 virus 12/04/2019   PCP NOTES >>>>>>>>>>>>>>>>>>>>>> 04/01/2016   Gout 02/06/2014   Glaucoma 12/07/2012   Annual physical exam 08/08/2011   CKD (chronic kidney disease) stage 3, GFR 30-59 ml/min (HCC) 09/23/2010   Hypothyroidism 06/13/2008   DIVERTICULOSIS, COLON 01/27/2008   Anemia 08/03/2007   Abnormal MRI of abdomen 08/03/2007   Essential  hypertension 05/31/2007    Past Surgical History:  Procedure Laterality Date   CATARACT EXTRACTION W/ INTRAOCULAR LENS  IMPLANT, BILATERAL  2006   COLONOSCOPY     ORIF TIBIA PLATEAU  10/24/2012   Procedure: OPEN REDUCTION INTERNAL FIXATION (ORIF) TIBIAL PLATEAU;  Surgeon: Kerrin Champagne, MD;  Location: [facility];  Service: Orthopedics;  Laterality: Left;    OB History   No obstetric history on file.      Home Medications    Prior to Admission medications   Medication Sig Start Date End Date Taking? Authorizing Provider  triamcinolone (KENALOG) 0.025 % cream Apply 1 Application topically 2 (two) times daily as needed (Rash). 05/15/23  Yes Bing Neighbors, NP  acetaminophen (TYLENOL) 500 MG tablet Take 500-1,000 mg by mouth every 6 (six) hours as needed for fever or headache (pain).    [provider]  amLODipine (NORVASC) 10 MG tablet Take 1 tablet (10 mg total) by mouth daily. 03/10/23   Wanda Plump, MD  ascorbic acid (VITAMIN C) 500 MG tablet Take 1 tablet (500 mg total) by mouth daily. 12/07/19   Darlin Drop, DO  cholecalciferol (VITAMIN D) 25 MCG tablet Take 1 tablet (1,000 Units total) by mouth daily. 12/07/19   Darlin Drop, DO  levothyroxine (SYNTHROID) 75 MCG tablet TAKE 1 TABLET  EVERY DAY FOR 6 DAYS OF THE WEEK (SKIP 1 DAY) 05/15/22   Wanda Plump, MD    Family History Family History  Problem Relation Age of Onset   Heart attack Sister    Colon cancer Sister 79   Cancer Sister    Heart attack Brother    Pancreatic cancer Brother    Diabetes Mother    Esophageal cancer Neg Hx    Stomach cancer Neg Hx    Rectal cancer Neg Hx    Breast cancer Neg Hx     Social History Social History   Tobacco Use   Smoking status: Never   Smokeless tobacco: Never  Substance Use Topics   Alcohol use: No   Drug use: No     Allergies   Patient has no known allergies.   Review of Systems Review of Systems Pertinent negatives listed in HPI  Physical Exam Triage  Vital Signs ED Triage Vitals [05/15/23 1636]  Enc Vitals Group     BP (!) 167/71     Pulse Rate 70     Resp 20     Temp (!) 97.5 F (36.4 C)     Temp Source Oral     SpO2 95 %     Weight      Height      Head Circumference      Peak Flow      Pain Score 0     Pain Loc      Pain Edu?      Excl. in GC?    No data found.  Updated Vital Signs BP (!) 167/71 (BP Location: Left Arm)   Pulse 70   Temp (!) 97.5 F (36.4 C) (Oral)   Resp 20   SpO2 95%   Visual Acuity Right Eye Distance:   Left Eye Distance:   Bilateral Distance:    Right Eye Near:   Left Eye Near:    Bilateral Near:     Physical Exam Vitals reviewed.  Constitutional:      Appearance: Normal appearance.  HENT:     Head: Normocephalic and atraumatic.  Cardiovascular:     Rate and Rhythm: Normal rate and regular rhythm.  Pulmonary:     Effort: Pulmonary effort is normal.     Breath sounds: Normal breath sounds.  Skin:    General: Skin is warm and dry.     Findings: Rash present. Rash is papular.     Comments: Fine papular rash on chest wall lower abdomen and bilateral upper extremities without  Neurological:     Mental Status: She is alert and oriented to person, place, and time.     GCS: GCS eye subscore is 4. GCS verbal subscore is 5. GCS motor subscore is 6.     Gait: Gait is intact.      UC Treatments / Results  Labs (all labs ordered are listed, but only abnormal results are displayed) Labs Reviewed - No data to display  EKG   Radiology No results found.  Procedures Procedures (including critical care time)  Medications Ordered in UC Medications  dexamethasone (DECADRON) injection 5 mg (5 mg Intramuscular Given 05/15/23 1657)    Initial Impression / Assessment and Plan / UC Course  I have reviewed the triage vital signs and the nursing notes.  Pertinent labs & imaging results that were available during my care of the patient were reviewed by me and considered in my medical  decision making (see chart for  details).    Dermatitis of unknown etiology,  Decadron 5 mg IM given here in clinic. Start triamcinolone cream twice daily as needed for itching related to rash. Final Clinical Impressions(s) / UC Diagnoses   Final diagnoses:  Dermatitis   Discharge Instructions   None    ED Prescriptions     Medication Sig Dispense Auth. Provider   triamcinolone (KENALOG) 0.025 % cream Apply 1 Application topically 2 (two) times daily as needed (Rash). 454 g Bing Neighbors, NP      PDMP not reviewed this encounter.   Bing Neighbors, NP 05/15/23 [phone]

## 2023-05-15 NOTE — ED Triage Notes (Signed)
Pt reports she has a rash on her arms and chest x 5 days. Took benadryl and coco butter but no relief. States it itches.   Pt denies any new meds or encounters with anything new in her life.

## 2023-05-21 ENCOUNTER — Other Ambulatory Visit: Payer: Self-pay | Admitting: Internal Medicine

## 2023-05-21 DIAGNOSIS — E039 Hypothyroidism, unspecified: Secondary | ICD-10-CM

## 2023-08-02 ENCOUNTER — Other Ambulatory Visit: Payer: Self-pay | Admitting: Internal Medicine

## 2023-08-23 ENCOUNTER — Encounter (HOSPITAL_COMMUNITY): Payer: Self-pay | Admitting: Emergency Medicine

## 2023-08-23 ENCOUNTER — Ambulatory Visit (HOSPITAL_COMMUNITY)
Admission: EM | Admit: 2023-08-23 | Discharge: 2023-08-23 | Disposition: A | Discharge status: Home / Self Care | Payer: Medicare HMO | Attending: Emergency Medicine | Admitting: Emergency Medicine

## 2023-08-23 DIAGNOSIS — M109 Gout, unspecified: Secondary | ICD-10-CM

## 2023-08-23 MED ORDER — COLCHICINE 0.6 MG PO TABS: ORAL_TABLET | ORAL | 0 refills | Status: DC

## 2023-08-23 MED ORDER — PREDNISONE 20 MG PO TABS: 20.0000 mg | ORAL_TABLET | Freq: Every day | ORAL | 0 refills | Status: AC

## 2023-08-23 NOTE — ED Triage Notes (Signed)
Pt c/o left hand swelling for a couple days. Denies injury. Reports hx gout. Iced hand yesterday and took tylenol

## 2023-08-23 NOTE — Discharge Instructions (Signed)
Take 2 tablets of colchicine as soon as you pick up prescription and then take the other tablet an hour later. Start taking prednisone once daily for 5 days. You can alternate between Tylenol and Ibuprofen every 4-6 hours as needed for pain, and apply ice to your hand to help with swelling. Return here if symptoms persist. If you develop fever, worsening redness, swelling, or pain, or confusion then please seek immediate medical treatment in the ER.

## 2023-08-23 NOTE — ED Provider Notes (Signed)
[facility]    CSN: [id number] Arrival date & time: 08/23/23  1045      History   Chief Complaint Chief Complaint  Patient presents with   hand swelling    HPI [name] is a 88 y.o. female.   Patient presents with left hand swelling since yesterday morning.  Reports using Tylenol and ice with some relief.  Denies any known injury.  History of gout.     Past Medical History:  Diagnosis Date   Abnormal MRI of abdomen    ABNORMAL CT and MRI of pancreas, last MRI 1-09: after d/w GI we rec. to f/u clinically and redo XRs if problems     Anemia    CRI (chronic renal insufficiency)    elevated creatinine 4-11: stage 3 CKD, sees nephrology, history of solitary    Diverticulosis    Glaucoma    Gout    h/o    Hypertension    Hyperthyroidism    s/p ablation, Dr Lisabeth Devoid    Patient Active Problem List   Diagnosis Date Noted   Hyperthyroidism    Hypertension    Diverticulosis    CRI (chronic renal insufficiency)    AKI (acute kidney injury) (HCC) 12/04/2019   Acute respiratory disease due to COVID-19 virus 12/04/2019   Pneumonia due to COVID-19 virus 12/04/2019   PCP NOTES >>>>>>>>>>>>>>>>>>>>>> 04/01/2016   Gout 02/06/2014   Glaucoma 12/07/2012   Annual physical exam 08/08/2011   CKD (chronic kidney disease) stage 3, GFR 30-59 ml/min (HCC) 09/23/2010   Hypothyroidism 06/13/2008   DIVERTICULOSIS, COLON 01/27/2008   Anemia 08/03/2007   Abnormal MRI of abdomen 08/03/2007   Essential hypertension 05/31/2007    Past Surgical History:  Procedure Laterality Date   CATARACT EXTRACTION W/ INTRAOCULAR LENS  IMPLANT, BILATERAL  2006   COLONOSCOPY     ORIF TIBIA PLATEAU  10/24/2012   Procedure: OPEN REDUCTION INTERNAL FIXATION (ORIF) TIBIAL PLATEAU;  Surgeon: Kerrin Champagne, MD;  Location: [facility];  Service: Orthopedics;  Laterality: Left;    OB History   No obstetric history on file.      Home Medications    Prior to Admission medications    Medication Sig Start Date End Date Taking? Authorizing Provider  colchicine 0.6 MG tablet Take 2 tablets by mouth and then 1 tablet an hour later. 08/23/23  Yes Susann Givens, Jalil Lorusso A, NP  predniSONE (DELTASONE) 20 MG tablet Take 1 tablet (20 mg total) by mouth daily for 5 days. 08/23/23 08/28/23 Yes Susann Givens, Dennise Raabe A, NP  acetaminophen (TYLENOL) 500 MG tablet Take 500-1,000 mg by mouth every 6 (six) hours as needed for fever or headache (pain).    [provider]  amLODipine (NORVASC) 10 MG tablet Take 1 tablet (10 mg total) by mouth daily. 08/03/23   Wanda Plump, MD  ascorbic acid (VITAMIN C) 500 MG tablet Take 1 tablet (500 mg total) by mouth daily. 12/07/19   Darlin Drop, DO  cholecalciferol (VITAMIN D) 25 MCG tablet Take 1 tablet (1,000 Units total) by mouth daily. 12/07/19   Darlin Drop, DO  levothyroxine (SYNTHROID) 75 MCG tablet TAKE 1 TABLET EVERY DAY FOR 6 DAYS OF THE WEEK (SKIP 1 DAY) 05/21/23   Wanda Plump, MD  triamcinolone (KENALOG) 0.025 % cream Apply 1 Application topically 2 (two) times daily as needed (Rash). 05/15/23   Bing Neighbors, NP    Family History Family History  Problem Relation Age of Onset   Heart  attack Sister    Colon cancer Sister 50   Cancer Sister    Heart attack Brother    Pancreatic cancer Brother    Diabetes Mother    Esophageal cancer Neg Hx    Stomach cancer Neg Hx    Rectal cancer Neg Hx    Breast cancer Neg Hx     Social History Social History   Tobacco Use   Smoking status: Never   Smokeless tobacco: Never  Substance Use Topics   Alcohol use: No   Drug use: No     Allergies   Patient has no known allergies.   Review of Systems Review of Systems  Constitutional:  Negative for chills, fatigue and fever.  Musculoskeletal:  Positive for joint swelling.  Skin:  Positive for color change.  Neurological:  Negative for dizziness and light-headedness.  Psychiatric/Behavioral:  Negative for confusion.      Physical  Exam Triage Vital Signs ED Triage Vitals  Encounter Vitals Group     BP 08/23/23 1135 (!) 151/66     Systolic BP Percentile --      Diastolic BP Percentile --      Pulse Rate 08/23/23 1135 72     Resp 08/23/23 1135 18     Temp 08/23/23 1135 97.8 F (36.6 C)     Temp Source 08/23/23 1135 Oral     SpO2 08/23/23 1135 98 %     Weight --      Height --      Head Circumference --      Peak Flow --      Pain Score 08/23/23 1134 7     Pain Loc --      Pain Education --      Exclude from Growth Chart --    No data found.  Updated Vital Signs BP (!) 151/66 (BP Location: Right Arm)   Pulse 72   Temp 97.8 F (36.6 C) (Oral)   Resp 18   SpO2 98%   Visual Acuity Right Eye Distance:   Left Eye Distance:   Bilateral Distance:    Right Eye Near:   Left Eye Near:    Bilateral Near:     Physical Exam Vitals and nursing note reviewed.  Constitutional:      General: She is awake. She is not in acute distress.    Appearance: Normal appearance. She is well-developed and well-groomed. She is not ill-appearing, toxic-appearing or diaphoretic.  Musculoskeletal:     Right hand: Normal.     Left hand: Swelling, tenderness and bony tenderness present. Decreased range of motion.     Comments: Moderate swelling, erythema, tenderness and decreased range of motion to L MCP joint of index finger  Skin:    General: Skin is warm and dry.  Neurological:     Mental Status: She is alert.  Psychiatric:        Behavior: Behavior is cooperative.      UC Treatments / Results  Labs (all labs ordered are listed, but only abnormal results are displayed) Labs Reviewed - No data to display  EKG   Radiology No results found.  Procedures Procedures (including critical care time)  Medications Ordered in UC Medications - No data to display  Initial Impression / Assessment and Plan / UC Course  I have reviewed the triage vital signs and the nursing notes.  Pertinent labs & imaging results  that were available during my care of the patient were reviewed by me and  considered in my medical decision making (see chart for details).     Patient presented with 2-day history of left hand swelling.  Denies any known injury.  History of gout.  Upon assessment patient has moderate swelling, erythema, tenderness, and decreased range of motion of left MCP joint of index finger.  Prescribed colchicine and prednisone for gout flare.  Discussed follow-up, return, emergency department precautions. Final Clinical Impressions(s) / UC Diagnoses   Final diagnoses:  Acute gout of left hand, unspecified cause     Discharge Instructions      Take 2 tablets of colchicine as soon as you pick up prescription and then take the other tablet an hour later. Start taking prednisone once daily for 5 days. You can alternate between Tylenol and Ibuprofen every 4-6 hours as needed for pain, and apply ice to your hand to help with swelling. Return here if symptoms persist. If you develop fever, worsening redness, swelling, or pain, or confusion then please seek immediate medical treatment in the ER.     ED Prescriptions     Medication Sig Dispense Auth. Provider   predniSONE (DELTASONE) 20 MG tablet Take 1 tablet (20 mg total) by mouth daily for 5 days. 5 tablet Wynonia Lawman A, NP   colchicine 0.6 MG tablet Take 2 tablets by mouth and then 1 tablet an hour later. 3 tablet Wynonia Lawman A, NP      PDMP not reviewed this encounter.   Wynonia Lawman A, NP 08/23/23 1255

## 2023-10-05 ENCOUNTER — Encounter: Payer: Self-pay | Admitting: Internal Medicine

## 2023-10-05 ENCOUNTER — Ambulatory Visit (INDEPENDENT_AMBULATORY_CARE_PROVIDER_SITE_OTHER): Payer: Medicare HMO | Admitting: Internal Medicine

## 2023-10-05 VITALS — BP 134/74 | HR 76 | Temp 98.0°F | Resp 16 | Ht 67.0 in | Wt 132.2 lb

## 2023-10-05 DIAGNOSIS — N183 Chronic kidney disease, stage 3 unspecified: Secondary | ICD-10-CM | POA: Diagnosis not present

## 2023-10-05 DIAGNOSIS — E039 Hypothyroidism, unspecified: Secondary | ICD-10-CM

## 2023-10-05 DIAGNOSIS — I10 Essential (primary) hypertension: Secondary | ICD-10-CM

## 2023-10-05 DIAGNOSIS — M109 Gout, unspecified: Secondary | ICD-10-CM | POA: Diagnosis not present

## 2023-10-05 DIAGNOSIS — M542 Cervicalgia: Secondary | ICD-10-CM

## 2023-10-05 DIAGNOSIS — Z23 Encounter for immunization: Secondary | ICD-10-CM | POA: Diagnosis not present

## 2023-10-05 DIAGNOSIS — R42 Dizziness and giddiness: Secondary | ICD-10-CM

## 2023-10-05 LAB — TSH: TSH: 3.48 u[IU]/mL (ref 0.35–5.50)

## 2023-10-05 LAB — URIC ACID: Uric Acid, Serum: 8.1 mg/dL — ABNORMAL HIGH (ref 2.4–7.0)

## 2023-10-05 MED ORDER — COLCHICINE 0.6 MG PO TABS: 0.6000 mg | ORAL_TABLET | Freq: Two times a day (BID) | ORAL | 0 refills | Status: DC | PRN

## 2023-10-05 NOTE — Patient Instructions (Addendum)
Vaccines I recommend: Covid booster Shingrix (shingles) Tdap (tetanus)  Take colchicine twice a day as needed if you have gout.  Apply ice.  Tylenol is okay.  For neck pain: Tylenol 500 mg 2 tablets every 8 hours as needed.  Apply a heating pad.   Check the  blood pressure regularly Blood pressure goal:  between 110/65 and  135/85. If it is consistently higher or lower, let me know     GO TO THE LAB : Get the blood work     Next visit with me by May 2025 physical exam.  Come back sooner if needed. Please schedule it at the front desk

## 2023-10-05 NOTE — Assessment & Plan Note (Signed)
HTN: BP very good today, continue amlodipine. CKD 3B: Saw renal 08-2023.  Felt to be relatively stable. Creatinine at the time was 1.9, sodium 144, potassium 4.4.  Hemoglobin 11.9. Hypothyroidism: On Synthroid, check TSH. Gout: Recent  had a episode after several years of no problems with gout, went to urgent care, responded well to prednisone and colchicine. Plan: RF colchicine, check a uric acid.  Further advised for results. Neck pain: As described above, recommend Tylenol, heating pad, call if not better Lightheaded episode: No red flags, symptoms were reportedly mild and lasted few minutes. EKG today: NSR, no acute change compared to previous EKG. Rec observation.,  Good hydration.  Red flags discussed. Vaccine advice: Flu shot today.  Recommend COVID-vaccine. RTC CPX 6 months.

## 2023-10-05 NOTE — Progress Notes (Signed)
Subjective:    Patient ID: [name], female    DOB: 05-30-1935, 88 y.o.   MRN: [id number]  DOS:  10/05/2023 Type of visit - description: f/u  Since last visit, went to urgent care, had an episode of gout.  No further symptoms.  2 weeks history of mild neck pain, located at the posterior neck, no recent falls, no radiation to the arms, has not needed to take any medication.  Yesterday for few minutes felt lightheaded, hard for her to describe the symptoms but was not spinning. No associated chest pain or palpitations.  No LOC.  Denies headache, double vision or slurred speech.   Review of Systems See above   Past Medical History:  Diagnosis Date   Abnormal MRI of abdomen    ABNORMAL CT and MRI of pancreas, last MRI 1-09: after d/w GI we rec. to f/u clinically and redo XRs if problems     Anemia    CRI (chronic renal insufficiency)    elevated creatinine 4-11: stage 3 CKD, sees nephrology, history of solitary    Diverticulosis    Glaucoma    Gout    h/o    Hypertension    Hyperthyroidism    s/p ablation, Dr Lisabeth Devoid    Past Surgical History:  Procedure Laterality Date   CATARACT EXTRACTION W/ INTRAOCULAR LENS  IMPLANT, BILATERAL  2006   COLONOSCOPY     ORIF TIBIA PLATEAU  10/24/2012   Procedure: OPEN REDUCTION INTERNAL FIXATION (ORIF) TIBIAL PLATEAU;  Surgeon: Kerrin Champagne, MD;  Location: [facility];  Service: Orthopedics;  Laterality: Left;    Current Outpatient Medications  Medication Instructions   acetaminophen (TYLENOL) 500-1,000 mg, Oral, Every 6 hours PRN   amLODipine (NORVASC) 10 mg, Oral, Daily   ascorbic acid (VITAMIN C) 500 mg, Oral, Daily   colchicine 0.6 MG tablet Take 2 tablets by mouth and then 1 tablet an hour later.   levothyroxine (SYNTHROID) 75 MCG tablet TAKE 1 TABLET EVERY DAY FOR 6 DAYS OF THE WEEK (SKIP 1 DAY)   triamcinolone (KENALOG) 0.025 % cream 1 Application, Topical, 2 times daily PRN   vitamin D3 (CHOLECALCIFEROL) 1,000 Units, Oral, Daily        Objective:   Physical Exam BP 134/74   Pulse 76   Temp 98 F (36.7 C) (Oral)   Resp 16   Ht 5\' 7"  (1.702 m)   Wt 132 lb 4 oz (60 kg)   SpO2 97%   BMI 20.71 kg/m  General:   Well developed, NAD, BMI noted. HEENT:  Normocephalic . Face symmetric, atraumatic. Neck: No TTP of the cervical spine, range of motion decreased throughout (not a new issue per patient). Carotid arteries: Normal pulses Lungs:  CTA B Normal respiratory effort, no intercostal retractions, no accessory muscle use. Heart: RRR,  no murmur.  Lower extremities: no pretibial edema bilaterally  Skin: Not pale. Not jaundice Neurologic:  alert & oriented X3.  Speech normal, gait appropriate for age and unassisted Psych--  Cognition and judgment appear intact.  Cooperative with normal attention span and concentration.  Behavior appropriate. No anxious or depressed appearing.      Assessment      Assessment HTN CKD, stage III, Single kidney (absent L one); sees nephrology prn, last visit 2014. (Creatinine 1.8 = clearance ~ 30) Hypothyroidism:  s/p ablation, Dr. Talmage Nap, no f/u schedule  Glaucoma H/o Gout Chronic anemia: Colonoscopy 08-2012, declines further screenings.  Normal ferritin before. H/o abnormal MRI abdomen ,  last MRI 2009, saw GI ---> follow up clinically, redo MRI prn HOH- hearing aids rx 2024   PLAN:  HTN: BP very good today, continue amlodipine. CKD 3B: Saw renal 08-2023.  Felt to be relatively stable. Creatinine at the time was 1.9, sodium 144, potassium 4.4.  Hemoglobin 11.9. Hypothyroidism: On Synthroid, check TSH. Gout: Recent  had a episode after several years of no problems with gout, went to urgent care, responded well to prednisone and colchicine. Plan: RF colchicine, check a uric acid.  Further advised for results. Neck pain: As described above, recommend Tylenol, heating pad, call if not better Lightheaded episode: No red flags, symptoms were reportedly mild and lasted  few minutes. EKG today: NSR, no acute change compared to previous EKG. Rec observation.,  Good hydration.  Red flags discussed. Vaccine advice: Flu shot today.  Recommend COVID-vaccine. RTC CPX 6 months.

## 2023-10-06 DIAGNOSIS — N390 Urinary tract infection, site not specified: Secondary | ICD-10-CM | POA: Diagnosis not present

## 2023-10-06 DIAGNOSIS — D631 Anemia in chronic kidney disease: Secondary | ICD-10-CM | POA: Diagnosis not present

## 2023-10-06 DIAGNOSIS — I129 Hypertensive chronic kidney disease with stage 1 through stage 4 chronic kidney disease, or unspecified chronic kidney disease: Secondary | ICD-10-CM | POA: Diagnosis not present

## 2023-10-06 DIAGNOSIS — N1832 Chronic kidney disease, stage 3b: Secondary | ICD-10-CM | POA: Diagnosis not present

## 2023-10-06 DIAGNOSIS — M109 Gout, unspecified: Secondary | ICD-10-CM | POA: Diagnosis not present

## 2023-10-06 DIAGNOSIS — N189 Chronic kidney disease, unspecified: Secondary | ICD-10-CM | POA: Diagnosis not present

## 2023-10-06 LAB — COMPREHENSIVE METABOLIC PANEL
Albumin: 4 (ref 3.5–5.0)
Calcium: 10.2 (ref 8.7–10.7)
eGFR: 25

## 2023-10-06 LAB — IRON,TIBC AND FERRITIN PANEL
%SAT: 28
Ferritin: 378
Iron: 62
TIBC: 221
UIBC: 159

## 2023-10-06 LAB — BASIC METABOLIC PANEL
BUN: 25 — AB (ref 4–21)
CO2: 23 — AB (ref 13–22)
Chloride: 107 (ref 99–108)
Creatinine: 2 — AB (ref 0.5–1.1)
Glucose: 84
Potassium: 4.7 meq/L (ref 3.5–5.1)
Sodium: 140 (ref 137–147)

## 2023-10-06 LAB — CBC AND DIFFERENTIAL: Hemoglobin: 10.7 — AB (ref 12.0–16.0)

## 2023-10-06 LAB — PROTEIN / CREATININE RATIO, URINE: Creatinine, Urine: 178.9

## 2023-10-13 ENCOUNTER — Encounter: Payer: Self-pay | Admitting: Internal Medicine

## 2023-10-16 ENCOUNTER — Other Ambulatory Visit: Payer: Self-pay | Admitting: Internal Medicine

## 2023-10-16 DIAGNOSIS — E039 Hypothyroidism, unspecified: Secondary | ICD-10-CM

## 2023-10-27 ENCOUNTER — Other Ambulatory Visit: Payer: Self-pay | Admitting: Internal Medicine

## 2023-12-22 ENCOUNTER — Telehealth: Payer: Self-pay | Admitting: Internal Medicine

## 2023-12-22 NOTE — Telephone Encounter (Signed)
Copied from CRM [id number]. Topic: Medicare AWV >> Dec 22, 2023 10:22 AM Payton Doughty wrote: Reason for CRM: Called LVM 12/22/2023 to schedule AWV. Please schedule Virtual or Telehealth visits ONLY.   Verlee Rossetti; Care Guide Ambulatory Clinical Support [place] [facility] Direct Dial: [phone]

## 2023-12-28 ENCOUNTER — Other Ambulatory Visit: Payer: Self-pay | Admitting: Internal Medicine

## 2024-01-24 ENCOUNTER — Other Ambulatory Visit: Payer: Self-pay | Admitting: Internal Medicine

## 2024-03-11 ENCOUNTER — Other Ambulatory Visit: Payer: Self-pay | Admitting: Internal Medicine

## 2024-03-11 DIAGNOSIS — E039 Hypothyroidism, unspecified: Secondary | ICD-10-CM

## 2024-04-04 ENCOUNTER — Encounter: Payer: Self-pay | Admitting: Internal Medicine

## 2024-04-04 ENCOUNTER — Ambulatory Visit (HOSPITAL_BASED_OUTPATIENT_CLINIC_OR_DEPARTMENT_OTHER)
Admission: RE | Admit: 2024-04-04 | Discharge: 2024-04-04 | Disposition: A | Discharge status: Home / Self Care | Source: Ambulatory Visit | Attending: Internal Medicine | Admitting: Internal Medicine

## 2024-04-04 ENCOUNTER — Ambulatory Visit (INDEPENDENT_AMBULATORY_CARE_PROVIDER_SITE_OTHER): Payer: Medicare HMO | Admitting: Internal Medicine

## 2024-04-04 VITALS — BP 128/68 | HR 68 | Temp 98.0°F | Resp 16 | Ht 67.0 in | Wt 129.5 lb

## 2024-04-04 DIAGNOSIS — I6782 Cerebral ischemia: Secondary | ICD-10-CM | POA: Diagnosis not present

## 2024-04-04 DIAGNOSIS — Z0000 Encounter for general adult medical examination without abnormal findings: Secondary | ICD-10-CM

## 2024-04-04 DIAGNOSIS — R4781 Slurred speech: Secondary | ICD-10-CM

## 2024-04-04 DIAGNOSIS — Z0001 Encounter for general adult medical examination with abnormal findings: Secondary | ICD-10-CM

## 2024-04-04 DIAGNOSIS — E039 Hypothyroidism, unspecified: Secondary | ICD-10-CM | POA: Diagnosis not present

## 2024-04-04 DIAGNOSIS — I10 Essential (primary) hypertension: Secondary | ICD-10-CM

## 2024-04-04 LAB — CBC WITH DIFFERENTIAL/PLATELET
Basophils Absolute: 0 10*3/uL (ref 0.0–0.1)
Basophils Relative: 0.3 % (ref 0.0–3.0)
Eosinophils Absolute: 0.1 10*3/uL (ref 0.0–0.7)
Eosinophils Relative: 1.7 % (ref 0.0–5.0)
HCT: 35.3 % — ABNORMAL LOW (ref 36.0–46.0)
Hemoglobin: 11.4 g/dL — ABNORMAL LOW (ref 12.0–15.0)
Lymphocytes Relative: 27 % (ref 12.0–46.0)
Lymphs Abs: 1.8 10*3/uL (ref 0.7–4.0)
MCHC: 32.4 g/dL (ref 30.0–36.0)
MCV: 95.4 fl (ref 78.0–100.0)
Monocytes Absolute: 0.4 10*3/uL (ref 0.1–1.0)
Monocytes Relative: 5.5 % (ref 3.0–12.0)
Neutro Abs: 4.4 10*3/uL (ref 1.4–7.7)
Neutrophils Relative %: 65.5 % (ref 43.0–77.0)
Platelets: 355 10*3/uL (ref 150.0–400.0)
RBC: 3.7 Mil/uL — ABNORMAL LOW (ref 3.87–5.11)
RDW: 15.8 % — ABNORMAL HIGH (ref 11.5–15.5)
WBC: 6.8 10*3/uL (ref 4.0–10.5)

## 2024-04-04 LAB — LIPID PANEL
Cholesterol: 150 mg/dL (ref 0–200)
HDL: 76.6 mg/dL (ref >39.00)
LDL Cholesterol: 60 mg/dL (ref 0–99)
NonHDL: 73.82
Total CHOL/HDL Ratio: 2
Triglycerides: 67 mg/dL (ref 0.0–149.0)
VLDL: 13.4 mg/dL (ref 0.0–40.0)

## 2024-04-04 LAB — BASIC METABOLIC PANEL WITH GFR
BUN: 25 mg/dL — ABNORMAL HIGH (ref 6–23)
CO2: 24 meq/L (ref 19–32)
Calcium: 9.7 mg/dL (ref 8.4–10.5)
Chloride: 104 meq/L (ref 96–112)
Creatinine, Ser: 2.26 mg/dL — ABNORMAL HIGH (ref 0.40–1.20)
GFR: 19.11 mL/min — ABNORMAL LOW (ref >60.00)
Glucose, Bld: 82 mg/dL (ref 70–99)
Potassium: 4.7 meq/L (ref 3.5–5.1)
Sodium: 138 meq/L (ref 135–145)

## 2024-04-04 LAB — ALT: ALT: 10 U/L (ref 0–35)

## 2024-04-04 LAB — HEMOGLOBIN A1C: Hgb A1c MFr Bld: 5.4 % (ref 4.6–6.5)

## 2024-04-04 LAB — AST: AST: 17 U/L (ref 0–37)

## 2024-04-04 LAB — TSH: TSH: 1.71 u[IU]/mL (ref 0.35–5.50)

## 2024-04-04 NOTE — Progress Notes (Unsigned)
 Subjective:    Patient ID: [name], female    DOB: Apr 15, 1935, 89 y.o.   MRN: [id number]  DOS:  04/04/2024 Type of visit - description: CPX  Here for CPX. Chronic and acute issues addressed. 2 days ago had an episode of slurred speech noted by her daughter which was with her. It lasted few seconds. No associated headache, nausea, diplopia, face numbness or weakness.  Other than that, she feels well, occasional edema at the end of the day. Denies chest pain, difficulty breathing or palpitations.    Review of Systems See above   Past Medical History:  Diagnosis Date   Abnormal MRI of abdomen    ABNORMAL CT and MRI of pancreas, last MRI 1-09: after d/w GI we rec. to f/u clinically and redo XRs if problems     Anemia    CRI (chronic renal insufficiency)    elevated creatinine 4-11: stage 3 CKD, sees nephrology, history of solitary    Diverticulosis    Glaucoma    Gout    h/o    Hypertension    Hyperthyroidism    s/p ablation, Dr Maurita Sow    Past Surgical History:  Procedure Laterality Date   CATARACT EXTRACTION W/ INTRAOCULAR LENS  IMPLANT, BILATERAL  2006   COLONOSCOPY     ORIF TIBIA PLATEAU  10/24/2012   Procedure: OPEN REDUCTION INTERNAL FIXATION (ORIF) TIBIAL PLATEAU;  Surgeon: Alphonso Jean, MD;  Location: [facility];  Service: Orthopedics;  Laterality: Left;    Current Outpatient Medications  Medication Instructions   acetaminophen  (TYLENOL ) 500-1,000 mg, Every 6 hours PRN   amLODipine  (NORVASC ) 10 mg, Oral, Daily   ascorbic acid  (VITAMIN C) 500 mg, Oral, Daily   colchicine  0.6 mg, Oral, 2 times daily PRN   levothyroxine  (SYNTHROID ) 75 MCG tablet TAKE 1 TABLET EVERY DAY FOR 6 DAYS OF THE WEEK (SKIP 1 DAY)   triamcinolone  (KENALOG ) 0.025 % cream 1 Application, Topical, 2 times daily PRN   vitamin D3 (CHOLECALCIFEROL ) 1,000 Units, Oral, Daily       Objective:   Physical Exam BP 128/68   Pulse 68   Temp 98 F (36.7 C) (Oral)   Resp 16   Ht 5\' 7"  (1.702  m)   Wt 129 lb 8 oz (58.7 kg)   SpO2 98%   BMI 20.28 kg/m  General: Well developed, NAD, BMI noted Neck: Normal carotid pulses.  No bruit HEENT:  Normocephalic . Face symmetric, atraumatic Lungs:  CTA B Normal respiratory effort, no intercostal retractions, no accessory muscle use. Heart: RRR,  no murmur.  Abdomen:  Not distended, soft, non-tender. No rebound or rigidity.   Lower extremities: Trace is pretibial edema bilaterally  Skin: Exposed areas without rash. Not pale. Not jaundice Neurologic:  alert & oriented X3.  Speech normal, gait appropriate for age and unassisted Strength symmetric and appropriate for age. EOMI.  Face symmetric. Psych: Cognition and judgment appear intact.  Cooperative with normal attention span and concentration.  Behavior appropriate. No anxious or depressed appearing.     Assessment     Assessment HTN CKD, stage III, Single kidney (absent L one); sees nephrology prn, last visit 2014. (Creatinine 1.8 = clearance ~ 30) Hypothyroidism:  s/p ablation, Dr. Ronelle Coffee, no f/u schedule  Glaucoma H/o Gout Chronic anemia: Colonoscopy 08-2012, declines further screenings.  Normal ferritin before. H/o abnormal MRI abdomen , last MRI 2009, saw GI ---> follow up clinically, redo MRI prn HOH- hearing aids rx 2024   PLAN:  Here for CPX Td: 2015 - PNM 23: 2015; prevnar : 2016; PNM 20: 2023 -Vaccines I recommend: Tdap, Shingrix , RSV, flu shot every fall.  COVID booster is an option.  - Colon, cervical and breast cancer screenings: Patient elected to stop screenings, see previous entries - DEXA: Has declined consistently -diet and  exercise discussed  - Healthcare POA: Information provided, see AVS - Labs .BMP AST ALT CBC A1c FLP  We also addressed the following: HTN: BP looks good, continue amlodipine  CKD: Per nephrology, checking a BMP Hypothyroidism: Synthroid , check TSH. Slurred speech: TIA?Aaron Aas Patient is 3, not on aspirin  due to guidelines, no  history of hyperlipidemia.  Symptoms were transient, slurred speech lasted seconds, no associated with those symptoms.  Neuro exam benign. EKG today: NSR, unchanged from previous. She does not like extensive workup thus we agreed on the following: CT head, carotid ultrasound, blood work, consider an aspirin  and a low-dose of statins. Seek attention if symptoms resurface.  See AVS RTC 2 months.

## 2024-04-04 NOTE — Patient Instructions (Addendum)
 INSTRUCTIONS  FOR TODAY  Consider this vaccines: Tetanus shot, Shingrix , RSV and a flu shot every fall. A COVID booster is an option.     GO TO THE LAB : Get the blood work     Next office visit for a checkup in 2 months Please make an appointment before you leave today Depending on your blood or XRs results it might be necessary to come back sooner   We are arranging for CT of the head and a ultrasound of your neck (carotid arteries)  If you have more problems with slurred speech or you develop problems with your vision, weakness of your face, arms or legs, facial numbness: call 911.

## 2024-04-05 ENCOUNTER — Encounter: Payer: Self-pay | Admitting: Internal Medicine

## 2024-04-05 DIAGNOSIS — D631 Anemia in chronic kidney disease: Secondary | ICD-10-CM | POA: Diagnosis not present

## 2024-04-05 DIAGNOSIS — M109 Gout, unspecified: Secondary | ICD-10-CM | POA: Diagnosis not present

## 2024-04-05 DIAGNOSIS — I129 Hypertensive chronic kidney disease with stage 1 through stage 4 chronic kidney disease, or unspecified chronic kidney disease: Secondary | ICD-10-CM | POA: Diagnosis not present

## 2024-04-05 DIAGNOSIS — N1832 Chronic kidney disease, stage 3b: Secondary | ICD-10-CM | POA: Diagnosis not present

## 2024-04-05 NOTE — Assessment & Plan Note (Signed)
 Here for CPX   We also addressed the following: HTN: BP looks good, continue amlodipine  CKD: Per nephrology, checking a BMP Hypothyroidism: Synthroid , check TSH. Slurred speech: TIA?Aaron Aas Patient is 74, not on aspirin  due to guidelines, no history of hyperlipidemia.  Symptoms were transient, slurred speech lasted seconds, no associated sxs. Neuro exam benign. EKG today: NSR, unchanged from previous. She does not like extensive workup thus we agreed on the following: CT head, carotid ultrasound, blood work, consider an aspirin  and a low-dose of statins. Addendum: CT head no acute however a new stroke is obvious compared to MRI from 03/24/2021.  Will start aspirin . Seek attention if symptoms resurface.  See AVS RTC 2 months.

## 2024-04-05 NOTE — Assessment & Plan Note (Signed)
 Here for CPX Td: 2015 - PNM 23: 2015; prevnar : 2016; PNM 20: 2023 -Vaccines I recommend: Tdap, Shingrix , RSV, flu shot every fall.  COVID booster is an option. - Colon, cervical and breast cancer screenings: Patient elected to stop screenings, see previous entries - DEXA: Has declined consistently -diet and  exercise discussed  - Healthcare POA: Information provided, see AVS - Labs .BMP AST ALT CBC A1c FLP

## 2024-04-06 ENCOUNTER — Ambulatory Visit: Payer: Self-pay | Admitting: Internal Medicine

## 2024-04-07 MED ORDER — ATORVASTATIN CALCIUM 10 MG PO TABS: 10.0000 mg | ORAL_TABLET | Freq: Every day | ORAL | 1 refills | Status: DC

## 2024-04-07 NOTE — Addendum Note (Signed)
 Addended by: Jacqulyne Gladue D on: 04/07/2024 11:28 AM   Modules accepted: Orders

## 2024-04-08 ENCOUNTER — Ambulatory Visit (HOSPITAL_COMMUNITY)
Admission: RE | Admit: 2024-04-08 | Discharge: 2024-04-08 | Disposition: A | Discharge status: Home / Self Care | Source: Ambulatory Visit | Attending: Internal Medicine | Admitting: Internal Medicine

## 2024-04-08 DIAGNOSIS — R4781 Slurred speech: Secondary | ICD-10-CM | POA: Diagnosis not present

## 2024-05-02 ENCOUNTER — Telehealth: Payer: Self-pay | Admitting: Internal Medicine

## 2024-05-02 NOTE — Telephone Encounter (Signed)
 Copied from CRM [id number]. Topic: Clinical - Medication Question >> May 02, 2024 10:28 AM [name] wrote: Reason for CRM: Pt would like to know if she is to continue amlodipine  or take both atorvastatin  (LIPITOR) 10 MG tablet and amlodipine  together? Pt can be reached [phone]

## 2024-05-25 ENCOUNTER — Other Ambulatory Visit: Payer: Self-pay | Admitting: Internal Medicine

## 2024-06-06 ENCOUNTER — Encounter: Payer: Self-pay | Admitting: Internal Medicine

## 2024-06-06 ENCOUNTER — Ambulatory Visit (INDEPENDENT_AMBULATORY_CARE_PROVIDER_SITE_OTHER): Admitting: Internal Medicine

## 2024-06-06 VITALS — BP 136/62 | HR 67 | Temp 98.2°F | Resp 16 | Ht 67.0 in | Wt 130.0 lb

## 2024-06-06 DIAGNOSIS — I10 Essential (primary) hypertension: Secondary | ICD-10-CM | POA: Diagnosis not present

## 2024-06-06 DIAGNOSIS — E785 Hyperlipidemia, unspecified: Secondary | ICD-10-CM | POA: Diagnosis not present

## 2024-06-06 DIAGNOSIS — H409 Unspecified glaucoma: Secondary | ICD-10-CM

## 2024-06-06 NOTE — Progress Notes (Signed)
 Subjective:    Patient ID: [name], female    DOB: Jun 30, 1935, 89 y.o.   MRN: [id number]  DOS:  06/06/2024 Type of visit - description: Follow-up  Since the last office visit is doing well. Denies chest pain or difficulty breathing.  No palpitations. No slurred speech.  No motor deficits.  Review of Systems See above   Past Medical History:  Diagnosis Date   Abnormal MRI of abdomen    ABNORMAL CT and MRI of pancreas, last MRI 1-09: after d/w GI we rec. to f/u clinically and redo XRs if problems     Anemia    CRI (chronic renal insufficiency)    elevated creatinine 4-11: stage 3 CKD, sees nephrology, history of solitary    Diverticulosis    Glaucoma    Gout    h/o    Hypertension    Hyperthyroidism    s/p ablation, Dr Lavon    Past Surgical History:  Procedure Laterality Date   CATARACT EXTRACTION W/ INTRAOCULAR LENS  IMPLANT, BILATERAL  2006   COLONOSCOPY     ORIF TIBIA PLATEAU  10/24/2012   Procedure: OPEN REDUCTION INTERNAL FIXATION (ORIF) TIBIAL PLATEAU;  Surgeon: Lynwood FORBES Better, MD;  Location: [facility];  Service: Orthopedics;  Laterality: Left;    Current Outpatient Medications  Medication Instructions   acetaminophen  (TYLENOL ) 500-1,000 mg, Every 6 hours PRN   amLODipine  (NORVASC ) 10 mg, Oral, Daily   ascorbic acid  (VITAMIN C) 500 mg, Oral, Daily   aspirin  EC 81 mg, Daily   atorvastatin  (LIPITOR) 10 mg, Oral, Daily at bedtime   colchicine  0.6 mg, Oral, 2 times daily PRN   levothyroxine  (SYNTHROID ) 75 MCG tablet TAKE 1 TABLET EVERY DAY FOR 6 DAYS OF THE WEEK (SKIP 1 DAY)   triamcinolone  (KENALOG ) 0.025 % cream 1 Application, Topical, 2 times daily PRN   vitamin D3 (CHOLECALCIFEROL ) 1,000 Units, Oral, Daily       Objective:   Physical Exam BP 136/62   Pulse 67   Temp 98.2 F (36.8 C) (Oral)   Resp 16   Ht 5' 7 (1.702 m)   Wt 130 lb (59 kg)   SpO2 97%   BMI 20.36 kg/m  General:   Well developed, NAD, BMI noted. HEENT:  Normocephalic . Face  symmetric, atraumatic Lungs:  CTA B Normal respiratory effort, no intercostal retractions, no accessory muscle use. Heart: RRR,  no murmur.  Lower extremities: no pretibial edema bilaterally  Skin: Not pale. Not jaundice Neurologic:  alert & oriented X3.  Speech normal, gait appropriate for age and unassisted Psych--  Cognition and judgment appear intact.  Cooperative with normal attention span and concentration.  Behavior appropriate. No anxious or depressed appearing.      Assessment    Assessment HTN CKD, stage III, Single kidney (absent L one); sees nephrology prn, last visit 2014. (Creatinine 1.8 = clearance ~ 30) Hypothyroidism:  s/p ablation, Dr. Tommas, no f/u schedule  Glaucoma H/o Gout Chronic anemia: Colonoscopy 08-2012, declines further screenings.  Normal ferritin before. H/o abnormal MRI abdomen , last MRI 2009, saw GI ---> follow up clinically, redo MRI prn HOH- hearing aids rx 2024  Stroke : See OV 03/2024  PLAN:  Stroke: At the last visit she c/o slurred speech, a CT head showed: No acute changes but a not previously seen stroke.  Was recommended to start aspirin .  Was recommended to start atorvastatin  however patient called w/ questions , the phone note never came to this office  thus  she decided not to take atorvastatin . She remains asymptomatic.  Plan: Continue aspirin , start atorvastatin , CMP FLP in 6 weeks. Dyslipidemia: Given documented history of a stroke, I started a statin.  See above. RTC labs 6 weeks RTC checkup 4 months

## 2024-06-06 NOTE — Patient Instructions (Signed)
 Continue your routine medicines  Start atorvastatin  10 mg 1 tablet daily.  At the front desk arrange for the following. Blood work to be done in 6 weeks to recheck your cholesterol Office visit with me in 4 months  Do not forget to get a flu shot this fall.  Consider COVID-vaccine.  We are referring you to an eye doctor to check your glaucoma

## 2024-06-06 NOTE — Assessment & Plan Note (Signed)
 Stroke: At the last visit she c/o slurred speech, a CT head showed: No acute changes but a not previously seen stroke.  Was recommended to start aspirin .  Was recommended to start atorvastatin  however patient called w/ questions , the phone note never came to this office thus  she decided not to take atorvastatin . She remains asymptomatic.  Plan: Continue aspirin , start atorvastatin , CMP FLP in 6 weeks. Dyslipidemia: Given documented history of a stroke, I started a statin.  See above. RTC labs 6 weeks RTC checkup 4 months

## 2024-06-07 NOTE — Telephone Encounter (Signed)
 Pt seen yesterday, already handled.

## 2024-06-16 ENCOUNTER — Ambulatory Visit

## 2024-07-14 ENCOUNTER — Telehealth: Payer: Self-pay | Admitting: *Deleted

## 2024-07-14 ENCOUNTER — Ambulatory Visit: Admitting: *Deleted

## 2024-07-14 VITALS — BP 163/71 | HR 59 | Temp 97.9°F | Resp 16 | Ht 67.0 in | Wt 131.4 lb

## 2024-07-14 DIAGNOSIS — Z0000 Encounter for general adult medical examination without abnormal findings: Secondary | ICD-10-CM | POA: Diagnosis not present

## 2024-07-14 DIAGNOSIS — R351 Nocturia: Secondary | ICD-10-CM

## 2024-07-14 NOTE — Telephone Encounter (Signed)
 Pt had AWV today.  BP readings were: 159/63, 163/71.  Pt states readings are not high at home.  She will check home readings over the weekend and will call us  Monday with the readings.  Pt denies chest pain, sob, dizziness.   Also, pt reports frequent urination at night x 1 month. Denies daytime frequency or other urinary symptoms. Pt has routine follow up with PCP on 10/07/24.   Please advise?

## 2024-07-14 NOTE — Progress Notes (Signed)
 Subjective:   [name] is a 89 y.o. who presents for a Medicare Wellness preventive visit.  As a reminder, Annual Wellness Visits don't include a physical exam, and some assessments may be limited, especially if this visit is performed virtually. We may recommend an in-person follow-up visit with your provider if needed.  Visit Complete: In person   Persons Participating in Visit: Patient.  AWV Questionnaire: No: Patient Medicare AWV questionnaire was not completed prior to this visit.  Cardiac Risk Factors include: advanced age (>27men, >79 women);dyslipidemia;hypertension;Other (see comment), Risk factor comments: CKD     Objective:    Today's Vitals   07/14/24 1301 07/14/24 1328  BP: (!) 159/63 (!) 163/71  Pulse: (!) 59   Resp: 16   Temp: 97.9 F (36.6 C)   TempSrc: Oral   SpO2: 99%   Weight: 131 lb 6.4 oz (59.6 kg)   Height: 5' 7 (1.702 m)    Body mass index is 20.58 kg/m.     07/14/2024    1:18 PM 01/01/2023   11:00 AM 12/31/2021    1:04 PM 09/26/2019   10:53 AM 09/17/2018   11:16 AM 09/16/2017   11:24 AM 01/26/2017    1:14 PM  Advanced Directives  Does Patient Have a Medical Advance Directive? No No Yes No No  No  No   Does patient want to make changes to medical advance directive?   Yes (MAU/Ambulatory/Procedural Areas - Information given)      Would patient like information on creating a medical advance directive? Yes (MAU/Ambulatory/Procedural Areas - Information given) No - Patient declined  No - Patient declined Yes (MAU/Ambulatory/Procedural Areas - Information given)  Yes (MAU/Ambulatory/Procedural Areas - Information given);No - Patient declined  No - Patient declined      Data saved with a previous flowsheet row definition    Current Medications (verified) Outpatient Encounter Medications as of 07/14/2024  Medication Sig   acetaminophen  (TYLENOL ) 500 MG tablet Take 500-1,000 mg by mouth every 6 (six) hours as needed for fever or headache (pain).    amLODipine  (NORVASC ) 10 MG tablet Take 1 tablet (10 mg total) by mouth daily.   ascorbic acid  (VITAMIN C) 500 MG tablet Take 1 tablet (500 mg total) by mouth daily.   aspirin  EC 81 MG tablet Take 81 mg by mouth daily. Swallow whole.   cholecalciferol  (VITAMIN D ) 25 MCG tablet Take 1 tablet (1,000 Units total) by mouth daily.   colchicine  0.6 MG tablet Take 1 tablet (0.6 mg total) by mouth 2 (two) times daily as needed.   levothyroxine  (SYNTHROID ) 75 MCG tablet TAKE 1 TABLET EVERY DAY FOR 6 DAYS OF THE WEEK (SKIP 1 DAY)   atorvastatin  (LIPITOR) 10 MG tablet Take 1 tablet (10 mg total) by mouth at bedtime. (Patient not taking: Reported on 07/14/2024)   No facility-administered encounter medications on file as of 07/14/2024.    Allergies (verified) Patient has no known allergies.   History: Past Medical History:  Diagnosis Date   Abnormal MRI of abdomen    ABNORMAL CT and MRI of pancreas, last MRI 1-09: after d/w GI we rec. to f/u clinically and redo XRs if problems     Anemia    CRI (chronic renal insufficiency)    elevated creatinine 4-11: stage 3 CKD, sees nephrology, history of solitary    Diverticulosis    Glaucoma    Gout    h/o    Hypertension    Hyperthyroidism    s/p ablation,  Dr Lavon   Past Surgical History:  Procedure Laterality Date   CATARACT EXTRACTION W/ INTRAOCULAR LENS  IMPLANT, BILATERAL  2006   COLONOSCOPY     ORIF TIBIA PLATEAU  10/24/2012   Procedure: OPEN REDUCTION INTERNAL FIXATION (ORIF) TIBIAL PLATEAU;  Surgeon: Lynwood FORBES Better, MD;  Location: [facility];  Service: Orthopedics;  Laterality: Left;   Family History  Problem Relation Age of Onset   Heart attack Sister    Colon cancer Sister 49   Cancer Sister    Heart attack Brother    Pancreatic cancer Brother    Diabetes Mother    Esophageal cancer Neg Hx    Stomach cancer Neg Hx    Rectal cancer Neg Hx    Breast cancer Neg Hx    Social History   Socioeconomic History   Marital status: Widowed     Spouse name: Not on file   Number of children: 1   Years of education: Not on file   Highest education level: Not on file  Occupational History   Occupation: retired , Conservation officer, nature   Tobacco Use   Smoking status: Never   Smokeless tobacco: Never  Substance and Sexual Activity   Alcohol use: No   Drug use: No   Sexual activity: Not on file  Other Topics Concern   Not on file  Social History Narrative   Divorced, used to live with her sister, died ~ 2019/10/09     Independent, drives    Household: pt and a niece   Has one daughter and another sister who lives here in town   Social Drivers of Health   Financial Resource Strain: Low Risk  (07/14/2024)   Overall Financial Resource Strain (CARDIA)    Difficulty of Paying Living Expenses: Not very hard  Food Insecurity: No Food Insecurity (07/14/2024)   Hunger Vital Sign    Worried About Running Out of Food in the Last Year: Never true    Ran Out of Food in the Last Year: Never true  Transportation Needs: No Transportation Needs (07/14/2024)   PRAPARE - Administrator, Civil Service (Medical): No    Lack of Transportation (Non-Medical): No  Physical Activity: Inactive (07/14/2024)   Exercise Vital Sign    Days of Exercise per Week: 0 days    Minutes of Exercise per Session: 0 min  Stress: No Stress Concern Present (07/14/2024)   [employer] of Occupational Health - Occupational Stress Questionnaire    Feeling of Stress: Not at all  Social Connections: Moderately Integrated (07/14/2024)   Social Connection and Isolation Panel    Frequency of Communication with Friends and Family: More than three times a week    Frequency of Social Gatherings with Friends and Family: More than three times a week    Attends Religious Services: More than 4 times per year    Active Member of [employer] or Organizations: Yes    Attends Banker Meetings: More than 4 times per year    Marital Status: Widowed    Tobacco  Counseling Counseling given: Not Answered    Clinical Intake:  Pre-visit preparation completed: Yes  Pain : No/denies pain     BMI - recorded: 20.58  Lab Results  Component Value Date   HGBA1C 5.4 04/04/2024   HGBA1C 5.5 07/07/2017     How often do you need to have someone help you when you read instructions, pamphlets, or other written materials from your doctor or pharmacy?: 1 -  Never  Interpreter Needed?: No  Information entered by :: Lolita Libra, CMA   Activities of Daily Living     07/14/2024    1:17 PM 06/06/2024   11:00 AM  In your present state of health, do you have any difficulty performing the following activities:  Hearing? 0 0  Comment has hearing aids   Vision? 0 0  Difficulty concentrating or making decisions? 0 0  Walking or climbing stairs? 0 0  Dressing or bathing? 0 0  Doing errands, shopping? 0 0  Preparing Food and eating ? N   Using the Toilet? N   In the past six months, have you accidently leaked urine? N   Comment Has nocturia   Do you have problems with loss of bowel control? N   Managing your Medications? N   Managing your Finances? N   Housekeeping or managing your Housekeeping? N     Patient Care Team: Amon Aloysius BRAVO, MD as PCP - General Tobb, Kardie, DO as PCP - Cardiology (Cardiology) Cleatus Collar, MD as Consulting Physician (Ophthalmology) Tommas Pears, MD as Consulting Physician (Endocrinology) Goldsborough, Kellie, MD as Consulting Physician (Nephrology)  I have updated your Care Teams any recent Medical Services you may have received from other providers in the past year.     Assessment:   This is a routine wellness examination for [place].  Hearing/Vision screen Hearing Screening - Comments:: Wears bilateral hearing aids Vision Screening - Comments:: Eye exam &gt; 1 yr ago. Will provider list of providers for pt today.   Goals Addressed               This Visit's Progress     Patient Stated  (pt-stated)        She wants to start going to the [facility].       Depression Screen     07/14/2024    1:12 PM 06/06/2024   10:59 AM 04/04/2024   11:01 AM 10/05/2023   11:07 AM 03/31/2023    9:53 AM 01/01/2023   11:02 AM 07/29/2022   11:05 AM  PHQ 2/9 Scores  PHQ - 2 Score 0 0 0 0 0 0 0  PHQ- 9 Score 2          Fall Risk     07/14/2024    1:09 PM 06/06/2024   11:00 AM 04/04/2024   11:01 AM 10/05/2023   11:07 AM 03/31/2023    9:53 AM  Fall Risk   Falls in the past year? 0 0 0 0 0  Number falls in past yr: 0 0 0 0 0  Injury with Fall? 0 0 0 0 0  Follow up Education provided Falls evaluation completed;Education provided Falls evaluation completed;Education provided Falls evaluation completed Falls evaluation completed    MEDICARE RISK AT HOME:  Medicare Risk at Home Any stairs in or around the home?: Yes If so, are there any without handrails?: Yes Home free of loose throw rugs in walkways, pet beds, electrical cords, etc?: Yes Adequate lighting in your home to reduce risk of falls?: Yes Life alert?: No Use of a cane, walker or w/c?: No Grab bars in the bathroom?: Yes Shower chair or bench in shower?: No Elevated toilet seat or a handicapped toilet?: No  TIMED UP AND GO:  Was the test performed?  Yes  Length of time to ambulate 10 feet: 6 sec Gait steady and fast without use of assistive device  Cognitive Function: 6CIT completed    09/16/2017  11:26 AM  MMSE - Mini Mental State Exam  Orientation to time 5   Orientation to Place 5   Registration 3   Attention/ Calculation 5   Recall 2   Language- name 2 objects 2   Language- repeat 1  Language- follow 3 step command 3   Language- read & follow direction 1   Write a sentence 1   Copy design 1   Total score 29      Data saved with a previous flowsheet row definition        07/14/2024    1:18 PM 01/01/2023   11:07 AM 12/31/2021    1:13 PM 09/26/2019   10:57 AM  6CIT Screen  What Year? 0 points 0 points 0  points 0 points  What month? 0 points 0 points 0 points 0 points  What time? 0 points 0 points 0 points   Count back from 20 0 points 0 points 0 points 0 points  Months in reverse 0 points 0 points 0 points 0 points  Repeat phrase 4 points 0 points 0 points 0 points  Total Score 4 points 0 points 0 points     Immunizations Immunization History  Administered Date(s) Administered   Fluad Quad(high Dose 65+) 09/26/2019, 11/27/2020   Fluad Trivalent(High Dose 65+) 10/05/2023   Influenza, High Dose Seasonal PF 10/02/2016, 09/16/2017, 09/17/2018   Influenza-Unspecified 10/10/2021, 09/05/2022   PFIZER(Purple Top)SARS-COV-2 Vaccination 01/20/2020, 02/14/2020, 10/11/2020   PNEUMOCOCCAL CONJUGATE-20 11/27/2021   Pneumococcal Conjugate-13 03/30/2015   Pneumococcal Polysaccharide-23 12/29/2013   Td 03/26/2010   Tdap 12/29/2013    Screening Tests Health Maintenance  Topic Date Due   COVID-19 Vaccine (4 - 2024-25 season) 07/26/2023   DTaP/Tdap/Td (3 - Td or Tdap) 12/30/2023   INFLUENZA VACCINE  06/24/2024   Zoster Vaccines- Shingrix  (1 of 2) 07/14/2025 (Originally [birth year]1987)   Medicare Annual Wellness (AWV)  07/14/2025   Pneumococcal Vaccine: 50+ Years  Completed   HPV VACCINES  Aged Out   Meningococcal B Vaccine  Aged Out   DEXA SCAN  Discontinued    Health Maintenance  Health Maintenance Due  Topic Date Due   COVID-19 Vaccine (4 - 2024-25 season) 07/26/2023   DTaP/Tdap/Td (3 - Td or Tdap) 12/30/2023   INFLUENZA VACCINE  06/24/2024   Health Maintenance Items Addressed: Will get Flu and tetanus booster at pharmacy.  Additional Screening:  Vision Screening: Recommended annual ophthalmology exams for early detection of glaucoma and other disorders of the eye. Would you like a referral to an eye doctor? No    Dental Screening: Recommended annual dental exams for proper oral hygiene  Community Resource Referral / Chronic Care Management: CRR required this visit?  No   CCM  required this visit?  No   Plan:    I have personally reviewed and noted the following in the patient's chart:   Medical and social history Use of alcohol, tobacco or illicit drugs  Current medications and supplements including opioid prescriptions. Patient is not currently taking opioid prescriptions. Functional ability and status Nutritional status Physical activity Advanced directives List of other physicians Hospitalizations, surgeries, and ER visits in previous 12 months Vitals Screenings to include cognitive, depression, and falls Referrals and appointments  In addition, I have reviewed and discussed with patient certain preventive protocols, quality metrics, and best practice recommendations. A written personalized care plan for preventive services as well as general preventive health recommendations were provided to patient.   Lolita Libra, CMA   07/14/2024  After Visit Summary: (In Person-Printed) AVS printed and given to the patient  Notes: see phone note

## 2024-07-14 NOTE — Patient Instructions (Addendum)
 Ms. [name] , Thank you for taking time out of your busy schedule to complete your Annual Wellness Visit with me. I enjoyed our conversation and look forward to speaking with you again next year. I, as well as your care team,  appreciate your ongoing commitment to your health goals. Please review the following plan we discussed and let me know if I can assist you in the future. Your Game plan/ To Do List    Follow up Visits: Next Medicare AWV with our clinical staff:  07/18/25 1pm, telephone  Next Office Visit with your provider: 10/07/24 11:20, Dr Amon  Clinician Recommendations:  Aim for 30 minutes of exercise or brisk walking, 6-8 glasses of water, and 5 servings of fruits and vegetables each day.   You will need to get the following vaccines at your local pharmacy:  Flu, tetanus      This is a list of the screening recommended for you and due dates:  Health Maintenance  Topic Date Due   COVID-19 Vaccine (4 - 2024-25 season) 07/26/2023   DTaP/Tdap/Td vaccine (3 - Td or Tdap) 12/30/2023   Medicare Annual Wellness Visit  01/02/2024   Flu Shot  06/24/2024   Zoster (Shingles) Vaccine (1 of 2) 07/14/2025*   Pneumococcal Vaccine for age over 78  Completed   HPV Vaccine  Aged Out   Meningitis B Vaccine  Aged Out   DEXA scan (bone density measurement)  Discontinued  *Topic was postponed. The date shown is not the original due date.   Once you have completed and notarized your Advanced Directives you may return a copy by either of the following: Advanced directives: (Copy Requested) Please bring a copy of your health care power of attorney and living will to the office to be added to your chart at your convenience. You can mail to [facility] [address] [place], [place] [address] or email to [email or web address] Advance Care Planning is important because it:  [x]  Makes sure you receive the medical care that is consistent with your values, goals, and preferences  [x]   It provides guidance to your family and loved ones and reduces their decisional burden about whether or not they are making the right decisions based on your wishes.  Follow the link provided in your after visit summary or read over the paperwork we have mailed to you to help you started getting your Advance Directives in place. If you need assistance in completing these, please reach out to us  so that we can help you!  See attachments for Preventive Care and Fall Prevention Tips.

## 2024-07-15 NOTE — Telephone Encounter (Signed)
 Okay, I agree with check BPs as stated. If patient so desires, we can check a UA urine culture.  Dx nocturia

## 2024-07-15 NOTE — Telephone Encounter (Signed)
 Notified pt. She is willing to provide urine sample on Monday. Lab appt scheduled. Lab order placed.

## 2024-07-18 ENCOUNTER — Other Ambulatory Visit (INDEPENDENT_AMBULATORY_CARE_PROVIDER_SITE_OTHER)

## 2024-07-18 DIAGNOSIS — R351 Nocturia: Secondary | ICD-10-CM | POA: Diagnosis not present

## 2024-07-18 NOTE — Addendum Note (Signed)
 Addended by: TRUDY CURVIN RAMAN on: 07/18/2024 01:56 PM   Modules accepted: Orders

## 2024-07-19 ENCOUNTER — Other Ambulatory Visit

## 2024-07-19 DIAGNOSIS — R351 Nocturia: Secondary | ICD-10-CM | POA: Diagnosis not present

## 2024-07-19 LAB — URINALYSIS, ROUTINE W REFLEX MICROSCOPIC
Bilirubin Urine: NEGATIVE
Hgb urine dipstick: NEGATIVE
Ketones, ur: NEGATIVE
Nitrite: NEGATIVE
RBC / HPF: NONE SEEN (ref 0–?)
Specific Gravity, Urine: 1.01 (ref 1.000–1.030)
Urine Glucose: NEGATIVE
Urobilinogen, UA: 0.2 (ref 0.0–1.0)
pH: 6 (ref 5.0–8.0)

## 2024-07-20 LAB — URINE CULTURE
MICRO NUMBER:: 16884317
SPECIMEN QUALITY:: ADEQUATE

## 2024-07-21 ENCOUNTER — Ambulatory Visit: Payer: Self-pay | Admitting: Internal Medicine

## 2024-08-07 ENCOUNTER — Other Ambulatory Visit: Payer: Self-pay | Admitting: Internal Medicine

## 2024-08-07 DIAGNOSIS — E039 Hypothyroidism, unspecified: Secondary | ICD-10-CM

## 2024-09-19 DIAGNOSIS — M1711 Unilateral primary osteoarthritis, right knee: Secondary | ICD-10-CM | POA: Diagnosis not present

## 2024-10-07 ENCOUNTER — Ambulatory Visit: Admitting: Internal Medicine

## 2024-10-18 ENCOUNTER — Ambulatory Visit: Admitting: Family

## 2024-10-18 ENCOUNTER — Ambulatory Visit (HOSPITAL_BASED_OUTPATIENT_CLINIC_OR_DEPARTMENT_OTHER)
Admission: RE | Admit: 2024-10-18 | Discharge: 2024-10-18 | Disposition: A | Discharge status: Home / Self Care | Source: Ambulatory Visit | Attending: Family | Admitting: Family

## 2024-10-18 VITALS — BP 160/58 | HR 77 | Temp 98.0°F | Resp 18 | Ht 67.0 in | Wt 132.6 lb

## 2024-10-18 DIAGNOSIS — I129 Hypertensive chronic kidney disease with stage 1 through stage 4 chronic kidney disease, or unspecified chronic kidney disease: Secondary | ICD-10-CM | POA: Diagnosis not present

## 2024-10-18 DIAGNOSIS — M109 Gout, unspecified: Secondary | ICD-10-CM | POA: Diagnosis not present

## 2024-10-18 DIAGNOSIS — I10 Essential (primary) hypertension: Secondary | ICD-10-CM

## 2024-10-18 DIAGNOSIS — M79641 Pain in right hand: Secondary | ICD-10-CM

## 2024-10-18 DIAGNOSIS — N2581 Secondary hyperparathyroidism of renal origin: Secondary | ICD-10-CM | POA: Diagnosis not present

## 2024-10-18 DIAGNOSIS — D631 Anemia in chronic kidney disease: Secondary | ICD-10-CM | POA: Diagnosis not present

## 2024-10-18 DIAGNOSIS — E039 Hypothyroidism, unspecified: Secondary | ICD-10-CM

## 2024-10-18 DIAGNOSIS — E785 Hyperlipidemia, unspecified: Secondary | ICD-10-CM

## 2024-10-18 DIAGNOSIS — N183 Chronic kidney disease, stage 3 unspecified: Secondary | ICD-10-CM | POA: Diagnosis not present

## 2024-10-18 DIAGNOSIS — N184 Chronic kidney disease, stage 4 (severe): Secondary | ICD-10-CM | POA: Diagnosis not present

## 2024-10-18 DIAGNOSIS — N189 Chronic kidney disease, unspecified: Secondary | ICD-10-CM | POA: Diagnosis not present

## 2024-10-18 DIAGNOSIS — S63031A Subluxation of midcarpal joint of right wrist, initial encounter: Secondary | ICD-10-CM | POA: Diagnosis not present

## 2024-10-18 NOTE — Progress Notes (Signed)
 Acute Office Visit  Subjective:     Patient ID: [name], female    DOB: [birth year], 89 y.o.   MRN: [id number]  Chief Complaint  Patient presents with   Follow-up    4 month    HPI Patient is in today for 51-month recheck of her chronic conditions.  She is a patient of Dr. Amon.  Has a history of hypothyroidism, hyperlipidemia, hypertension, stage III kidney disease.  She reports doing well overall.  Does have concerns of right hand pain and swelling over the last week.  Denies any injury.  Woke up and her right hand was swollen.  Pain 5 out of 10.  Worse with closing her hand.  Denies any repetitive motions with her hands but is right-hand dominant.  Review of Systems  Constitutional: Negative.   Respiratory: Negative.    Cardiovascular: Negative.   Gastrointestinal: Negative.   Genitourinary: Negative.   Musculoskeletal:        Right hand pain and swelling  Neurological: Negative.   Endo/Heme/Allergies: Negative.   Psychiatric/Behavioral: Negative.    All other systems reviewed and are negative.       Objective:    BP (!) 160/58   Pulse 77   Temp 98 F (36.7 C)   Resp 18   Ht 5' 7 (1.702 m)   Wt 132 lb 9.6 oz (60.1 kg)   SpO2 98%   BMI 20.77 kg/m    Physical Exam Vitals and nursing note reviewed.  Constitutional:      Appearance: Normal appearance. She is normal weight.  Cardiovascular:     Rate and Rhythm: Normal rate and regular rhythm.     Pulses: Normal pulses.     Heart sounds: Normal heart sounds.  Pulmonary:     Effort: Pulmonary effort is normal.     Breath sounds: Normal breath sounds.  Abdominal:     General: Abdomen is flat. Bowel sounds are normal.     Palpations: Abdomen is soft.     Tenderness: There is no abdominal tenderness. There is no guarding or rebound.  Musculoskeletal:     Cervical back: Normal range of motion and neck supple.     Comments: Right hand: At the PIP joint of the 1st and 2nd digits on the hand.  Mildly tender  to palpation.  Obvious swelling.  Skin:    General: Skin is warm and dry.  Neurological:     General: No focal deficit present.     Mental Status: She is alert and oriented to person, place, and time. Mental status is at baseline.  Psychiatric:        Mood and Affect: Mood normal.        Thought Content: Thought content normal.     No results found for any visits on 10/18/24.      Assessment & Plan:   Problem List Items Addressed This Visit     Hypothyroidism - Primary   Relevant Orders   HgB A1c   CBC w/Diff   Basic Metabolic Panel (BMET)   Lipid panel   TSH   Hypertension   Relevant Orders   HgB A1c   CBC w/Diff   Basic Metabolic Panel (BMET)   Lipid panel   Essential hypertension   Relevant Orders   HgB A1c   CBC w/Diff   Basic Metabolic Panel (BMET)   Lipid panel   Dyslipidemia   Relevant Orders   HgB A1c   CBC w/Diff  Basic Metabolic Panel (BMET)   Lipid panel   CKD (chronic kidney disease) stage 3, GFR 30-59 ml/min (HCC)   Relevant Orders   HgB A1c   CBC w/Diff   Basic Metabolic Panel (BMET)   Lipid panel   Other Visit Diagnoses       Right hand pain       Relevant Orders   DG Hand Complete Right      X-ray obtained today will notify patient pending results.  Labs also obtain.  Will discuss treatment plan thereafter.  Follow-up with PCP as scheduled in 4 months and sooner as needed. No orders of the defined types were placed in this encounter.   Return in about 4 months (around 02/15/2025).  Chong January B Leaira Fullam, FNP

## 2024-10-19 ENCOUNTER — Ambulatory Visit: Payer: Self-pay | Admitting: Family

## 2024-10-19 ENCOUNTER — Other Ambulatory Visit

## 2024-10-19 DIAGNOSIS — E785 Hyperlipidemia, unspecified: Secondary | ICD-10-CM | POA: Diagnosis not present

## 2024-10-19 DIAGNOSIS — E039 Hypothyroidism, unspecified: Secondary | ICD-10-CM

## 2024-10-19 DIAGNOSIS — N183 Chronic kidney disease, stage 3 unspecified: Secondary | ICD-10-CM | POA: Diagnosis not present

## 2024-10-19 DIAGNOSIS — I10 Essential (primary) hypertension: Secondary | ICD-10-CM

## 2024-10-19 LAB — LAB REPORT - SCANNED
Albumin, Urine POC: 566.3
Creatinine, POC: 213.8 mg/dL
EGFR: 22
Microalb Creat Ratio: 265

## 2024-10-19 LAB — CBC WITH DIFFERENTIAL/PLATELET
Basophils Absolute: 0.1 K/uL (ref 0.0–0.1)
Basophils Relative: 1.4 % (ref 0.0–3.0)
Eosinophils Absolute: 0.1 K/uL (ref 0.0–0.7)
Eosinophils Relative: 2 % (ref 0.0–5.0)
HCT: 34.4 % — ABNORMAL LOW (ref 36.0–46.0)
Hemoglobin: 11.5 g/dL — ABNORMAL LOW (ref 12.0–15.0)
Lymphocytes Relative: 28.2 % (ref 12.0–46.0)
Lymphs Abs: 2.1 K/uL (ref 0.7–4.0)
MCHC: 33.5 g/dL (ref 30.0–36.0)
MCV: 95.7 fl (ref 78.0–100.0)
Monocytes Absolute: 0.4 K/uL (ref 0.1–1.0)
Monocytes Relative: 5.8 % (ref 3.0–12.0)
Neutro Abs: 4.6 K/uL (ref 1.4–7.7)
Neutrophils Relative %: 62.6 % (ref 43.0–77.0)
Platelets: 307 K/uL (ref 150.0–400.0)
RBC: 3.6 Mil/uL — ABNORMAL LOW (ref 3.87–5.11)
RDW: 16.2 % — ABNORMAL HIGH (ref 11.5–15.5)
WBC: 7.4 K/uL (ref 4.0–10.5)

## 2024-10-19 LAB — HEMOGLOBIN A1C: Hgb A1c MFr Bld: 5.3 % (ref 4.6–6.5)

## 2024-10-19 NOTE — Addendum Note (Signed)
 Addended by: TRUDY CURVIN RAMAN on: 10/19/2024 01:11 PM   Modules accepted: Orders

## 2024-10-19 NOTE — Addendum Note (Signed)
 Addended by: TRUDY CURVIN RAMAN on: 10/19/2024 10:45 AM   Modules accepted: Orders

## 2024-10-20 LAB — BASIC METABOLIC PANEL WITH GFR
BUN/Creatinine Ratio: 12 (calc) (ref 6–22)
BUN: 30 mg/dL — ABNORMAL HIGH (ref 7–25)
CO2: 20 mmol/L (ref 20–32)
Calcium: 9.5 mg/dL (ref 8.6–10.4)
Chloride: 107 mmol/L (ref 98–110)
Creat: 2.42 mg/dL — ABNORMAL HIGH (ref 0.60–0.95)
Glucose, Bld: 96 mg/dL (ref 65–99)
Potassium: 4.2 mmol/L (ref 3.5–5.3)
Sodium: 138 mmol/L (ref 135–146)
eGFR: 19 mL/min/1.73m2 — ABNORMAL LOW (ref >=60)

## 2024-10-20 LAB — LIPID PANEL
Cholesterol: 141 mg/dL (ref <200)
HDL: 78 mg/dL (ref >=50)
LDL Cholesterol (Calc): 43 mg/dL
Non-HDL Cholesterol (Calc): 63 mg/dL (ref <130)
Total CHOL/HDL Ratio: 1.8 (calc) (ref <5.0)
Triglycerides: 113 mg/dL (ref <150)

## 2024-10-20 LAB — TSH: TSH: 17.77 m[IU]/L — ABNORMAL HIGH (ref 0.40–4.50)

## 2024-10-25 NOTE — Addendum Note (Signed)
 Addended by: Ahmeer Tuman D on: 10/25/2024 08:25 AM   Modules accepted: Orders

## 2024-10-29 ENCOUNTER — Other Ambulatory Visit: Payer: Self-pay | Admitting: Internal Medicine

## 2024-11-21 ENCOUNTER — Ambulatory Visit: Admitting: Radiology

## 2024-11-21 ENCOUNTER — Ambulatory Visit
Admission: EM | Admit: 2024-11-21 | Discharge: 2024-11-21 | Disposition: A | Discharge status: Home / Self Care | Attending: Physician Assistant | Admitting: Physician Assistant

## 2024-11-21 DIAGNOSIS — M25519 Pain in unspecified shoulder: Secondary | ICD-10-CM | POA: Insufficient documentation

## 2024-11-21 DIAGNOSIS — M25511 Pain in right shoulder: Secondary | ICD-10-CM | POA: Diagnosis not present

## 2024-11-21 DIAGNOSIS — S90111A Contusion of right great toe without damage to nail, initial encounter: Secondary | ICD-10-CM

## 2024-11-21 DIAGNOSIS — S40011A Contusion of right shoulder, initial encounter: Secondary | ICD-10-CM | POA: Diagnosis not present

## 2024-11-21 NOTE — ED Provider Notes (Signed)
 " [facility]    CSN: [id number] Arrival date & time: 11/21/24  1311      History   Chief Complaint Chief Complaint  Patient presents with   Fall    HPI [name] is a 89 y.o. female.   Patient complains of pain in her shoulder and pain in her right first toe after falling 5 days ago.  Patient reports she was walking upstairs with her hands full.  Patient states she lost her balance and caught her foot on a stair and fell down.  Patient reports she had on her right shoulder and hit her right first toe.  Patient complains of continued pain.  Patient has been taking Tylenol  with some relief.  Patient states she did not strike her head she did not lose consciousness.  Patient denies any other area of injuries other than her foot and her shoulder.  The history is provided by the patient. No language interpreter was used.  Fall    Past Medical History:  Diagnosis Date   Abnormal MRI of abdomen    ABNORMAL CT and MRI of pancreas, last MRI 1-09: after d/w GI we rec. to f/u clinically and redo XRs if problems     Anemia    CRI (chronic renal insufficiency)    elevated creatinine 4-11: stage 3 CKD, sees nephrology, history of solitary    Diverticulosis    Glaucoma    Gout    h/o    Hypertension    Hyperthyroidism    s/p ablation, Dr Lavon    Patient Active Problem List   Diagnosis Date Noted   Pain in joint, shoulder region 11/21/2024   Dyslipidemia 06/06/2024   Hyperthyroidism    Hypertension    Diverticulosis    CRI (chronic renal insufficiency)    AKI (acute kidney injury) 12/04/2019   PCP NOTES >>>>>>>>>>>>>>>>>>>>>> 04/01/2016   Gout 02/06/2014   Glaucoma 12/07/2012   Annual physical exam 08/08/2011   CKD (chronic kidney disease) stage 3, GFR 30-59 ml/min (HCC) 09/23/2010   Hypothyroidism 06/13/2008   DIVERTICULOSIS, COLON 01/27/2008   Anemia 08/03/2007   Abnormal MRI of abdomen 08/03/2007   Essential hypertension 05/31/2007    Past Surgical  History:  Procedure Laterality Date   CATARACT EXTRACTION W/ INTRAOCULAR LENS  IMPLANT, BILATERAL  2006   COLONOSCOPY     ORIF TIBIA PLATEAU  10/24/2012   Procedure: OPEN REDUCTION INTERNAL FIXATION (ORIF) TIBIAL PLATEAU;  Surgeon: Lynwood FORBES Better, MD;  Location: [facility];  Service: Orthopedics;  Laterality: Left;    OB History   No obstetric history on file.      Home Medications    Prior to Admission medications  Medication Sig Start Date End Date Taking? Authorizing Provider  acetaminophen  (TYLENOL ) 500 MG tablet Take 500-1,000 mg by mouth every 6 (six) hours as needed for fever or headache (pain).    [provider]  amLODipine  (NORVASC ) 10 MG tablet TAKE 1 TABLET EVERY DAY 08/07/24   Webb, Padonda B, FNP  ascorbic acid  (VITAMIN C) 500 MG tablet Take 1 tablet (500 mg total) by mouth daily. 12/07/19   Shona Terry SAILOR, DO  aspirin  EC 81 MG tablet Take 81 mg by mouth daily. Swallow whole.    [provider]  atorvastatin  (LIPITOR) 10 MG tablet TAKE 1 TABLET AT BEDTIME 10/30/24   Webb, Padonda B, FNP  cholecalciferol  (VITAMIN D ) 25 MCG tablet Take 1 tablet (1,000 Units total) by mouth daily. 12/07/19   Shona,  Carole N, DO  colchicine  0.6 MG tablet Take 1 tablet (0.6 mg total) by mouth 2 (two) times daily as needed. 01/25/24   Amon Aloysius BRAVO, MD  latanoprost  (XALATAN ) 0.005 % ophthalmic solution 1 drop into affected eye in the evening Ophthalmic Once a day    [provider]  levothyroxine  (SYNTHROID ) 75 MCG tablet Take 1 tablet (75 mcg total) by mouth daily before breakfast. 10/25/24   Paz, Jose E, MD  valsartan  (DIOVAN ) 160 MG tablet 1 tablet Orally Once a day    [provider]    Family History Family History  Problem Relation Age of Onset   Heart attack Sister    Colon cancer Sister 83   Cancer Sister    Heart attack Brother    Pancreatic cancer Brother    Diabetes Mother    Esophageal cancer Neg Hx    Stomach cancer Neg Hx    Rectal cancer Neg Hx     Breast cancer Neg Hx     Social History Social History[1]   Allergies   Patient has no known allergies.   Review of Systems Review of Systems  Musculoskeletal:  Positive for myalgias.  All other systems reviewed and are negative.    Physical Exam Triage Vital Signs ED Triage Vitals  Encounter Vitals Group     BP 11/21/24 1316 (!) 171/73     Girls Systolic BP Percentile --      Girls Diastolic BP Percentile --      Boys Systolic BP Percentile --      Boys Diastolic BP Percentile --      Pulse Rate 11/21/24 1316 71     Resp 11/21/24 1316 20     Temp 11/21/24 1316 (!) 97.4 F (36.3 C)     Temp Source 11/21/24 1316 Oral     SpO2 11/21/24 1316 98 %     Weight 11/21/24 1316 130 lb (59 kg)     Height 11/21/24 1316 5' 6.5 (1.689 m)     Head Circumference --      Peak Flow --      Pain Score 11/21/24 1353 10     Pain Loc --      Pain Education --      Exclude from Growth Chart --    No data found.  Updated Vital Signs BP (!) 145/72 (BP Location: Right Arm)   Pulse 71   Temp (!) 97.4 F (36.3 C) (Oral)   Resp 20   Ht 5' 6.5 (1.689 m)   Wt 59 kg   SpO2 98%   BMI 20.67 kg/m   Visual Acuity Right Eye Distance:   Left Eye Distance:   Bilateral Distance:    Right Eye Near:   Left Eye Near:    Bilateral Near:     Physical Exam Vitals and nursing note reviewed.  Constitutional:      Appearance: She is well-developed.  HENT:     Head: Normocephalic.  Cardiovascular:     Rate and Rhythm: Normal rate.  Pulmonary:     Effort: Pulmonary effort is normal.  Abdominal:     General: There is no distension.  Musculoskeletal:        General: Normal range of motion.     Comments: Tender right shoulder pain with range of motion neurovascular neurosensory intact Tender right first metatarsal joint, appears to have arthritic changes.  Neurovascular neurosensory intact  Skin:    General: Skin is warm.  Neurological:  General: No focal deficit present.      Mental Status: She is alert and oriented to person, place, and time.      UC Treatments / Results  Labs (all labs ordered are listed, but only abnormal results are displayed) Labs Reviewed - No data to display  EKG   Radiology DG Foot Complete Right Result Date: 11/21/2024 EXAM: 3 OR MORE VIEW(S) XRAY OF THE _LATERALITY_ FOOT 11/21/2024 01:55:25 PM COMPARISON: None available. CLINICAL HISTORY: fall FINDINGS: BONES AND JOINTS: No acute fracture. No malalignment. Small plantar calcaneal spur. First metatarsophalangeal joint osteoarthritis with hallux valgus deformity. Midfoot degenerative changes. SOFT TISSUES: Vascular calcifications. IMPRESSION: 1. No acute fracture or dislocation. Electronically signed by: Morgane Naveau MD 11/21/2024 02:37 PM EST RP Workstation: [id number]   DG Shoulder Right Result Date: 11/21/2024 EXAM: 1 VIEW(S) XRAY OF THE SHOULDER 11/21/2024 01:55:25 PM COMPARISON: None available. CLINICAL HISTORY: fall FINDINGS: BONES AND JOINTS: Glenohumeral joint is normally aligned. No acute fracture. No malalignment. The [facility] joint is unremarkable. SOFT TISSUES: No abnormal calcifications. Visualized lung is unremarkable. IMPRESSION: 1. No evidence of acute traumatic injury. Electronically signed by: Morgane Naveau MD 11/21/2024 02:36 PM EST RP Workstation: [id number]    Procedures Procedures (including critical care time)  Medications Ordered in UC Medications - No data to display  Initial Impression / Assessment and Plan / UC Course  I have reviewed the triage vital signs and the nursing notes.  Pertinent labs & imaging results that were available during my care of the patient were reviewed by me and considered in my medical decision making (see chart for details).     X-ray right foot and right shoulder show no evidence of fracture. Final Clinical Impressions(s) / UC Diagnoses   Final diagnoses:  Pain in joint of right shoulder  Contusion of right shoulder,  initial encounter  Contusion of right great toe without damage to nail, initial encounter     Discharge Instructions      Return if any problems.     ED Prescriptions   None    PDMP not reviewed this encounter. Patient advised Tylenol  for pain.  Patient discharged in stable condition An After Visit Summary was printed and given to the patient.        [1]  Social History Tobacco Use   Smoking status: Never   Smokeless tobacco: Never  Substance Use Topics   Alcohol use: No   Drug use: No     Flint Sonny POUR, PA-C 11/21/24 1456  "

## 2024-11-21 NOTE — Discharge Instructions (Signed)
 Return if any problems.

## 2024-11-21 NOTE — ED Triage Notes (Signed)
 Pt presents to UC for c/o falling 5 days ago. Pt reports having aches starting yesterday. She's taking extra strength tylenol . Pain is in right shoulder and right great toe.

## 2024-11-28 ENCOUNTER — Other Ambulatory Visit

## 2024-11-28 DIAGNOSIS — E039 Hypothyroidism, unspecified: Secondary | ICD-10-CM

## 2024-11-28 LAB — TSH: TSH: 13.78 u[IU]/mL — ABNORMAL HIGH (ref 0.35–5.50)

## 2024-11-30 ENCOUNTER — Ambulatory Visit: Payer: Self-pay | Admitting: Internal Medicine

## 2024-11-30 DIAGNOSIS — E039 Hypothyroidism, unspecified: Secondary | ICD-10-CM

## 2024-11-30 MED ORDER — LEVOTHYROXINE SODIUM 100 MCG PO TABS: 100.0000 ug | ORAL_TABLET | Freq: Every day | ORAL | 1 refills | Status: AC

## 2025-01-20 ENCOUNTER — Ambulatory Visit: Admitting: Internal Medicine

## 2025-02-15 ENCOUNTER — Ambulatory Visit: Admitting: Internal Medicine

## 2025-04-05 ENCOUNTER — Ambulatory Visit

## 2025-07-18 ENCOUNTER — Ambulatory Visit
